# Patient Record
Sex: Female | Born: 1960 | Race: Black or African American | Hispanic: No | Marital: Single
Health system: Southern US, Community
[De-identification: ages and names within clinical notes are randomized; demographics above are authoritative.]

## PROBLEM LIST (undated history)

## (undated) ENCOUNTER — Emergency Department (HOSPITAL_COMMUNITY): Payer: Self-pay

---

## 2020-10-16 ENCOUNTER — Inpatient Hospital Stay
Admission: RE | Admit: 2020-10-16 | Discharge: 2020-12-24 | Disposition: A | Discharge status: Skilled Nursing Facility | Payer: Medicare Other | Source: Other Acute Inpatient Hospital | Attending: Internal Medicine | Admitting: Internal Medicine

## 2020-10-16 ENCOUNTER — Other Ambulatory Visit (HOSPITAL_COMMUNITY): Payer: Medicare Other

## 2020-10-16 DIAGNOSIS — J189 Pneumonia, unspecified organism: Secondary | ICD-10-CM

## 2020-10-16 DIAGNOSIS — J90 Pleural effusion, not elsewhere classified: Secondary | ICD-10-CM

## 2020-10-16 DIAGNOSIS — L0291 Cutaneous abscess, unspecified: Secondary | ICD-10-CM

## 2020-10-16 DIAGNOSIS — J811 Chronic pulmonary edema: Secondary | ICD-10-CM

## 2020-10-16 DIAGNOSIS — D271 Benign neoplasm of left ovary: Secondary | ICD-10-CM

## 2020-10-16 DIAGNOSIS — Z9689 Presence of other specified functional implants: Secondary | ICD-10-CM

## 2020-10-16 DIAGNOSIS — J939 Pneumothorax, unspecified: Secondary | ICD-10-CM

## 2020-10-16 DIAGNOSIS — T85598A Other mechanical complication of other gastrointestinal prosthetic devices, implants and grafts, initial encounter: Secondary | ICD-10-CM

## 2020-10-16 DIAGNOSIS — R509 Fever, unspecified: Secondary | ICD-10-CM

## 2020-10-17 LAB — CBC
HCT: 25.7 % — ABNORMAL LOW (ref 36.0–46.0)
Hemoglobin: 8.3 g/dL — ABNORMAL LOW (ref 12.0–15.0)
MCH: 25.6 pg — ABNORMAL LOW (ref 26.0–34.0)
MCHC: 32.3 g/dL (ref 30.0–36.0)
MCV: 79.3 fL — ABNORMAL LOW (ref 80.0–100.0)
Platelets: 55 10*3/uL — ABNORMAL LOW (ref 150–400)
RBC: 3.24 MIL/uL — ABNORMAL LOW (ref 3.87–5.11)
RDW: 21.9 % — ABNORMAL HIGH (ref 11.5–15.5)
WBC: 13.8 10*3/uL — ABNORMAL HIGH (ref 4.0–10.5)
nRBC: 9.8 % — ABNORMAL HIGH (ref 0.0–0.2)

## 2020-10-17 LAB — BASIC METABOLIC PANEL
Anion gap: 13 (ref 5–15)
BUN: 40 mg/dL — ABNORMAL HIGH (ref 6–20)
CO2: 23 mmol/L (ref 22–32)
Calcium: 8.2 mg/dL — ABNORMAL LOW (ref 8.9–10.3)
Chloride: 95 mmol/L — ABNORMAL LOW (ref 98–111)
Creatinine, Ser: 4.09 mg/dL — ABNORMAL HIGH (ref 0.44–1.00)
GFR, Estimated: 12 mL/min — ABNORMAL LOW (ref >60)
Glucose, Bld: 167 mg/dL — ABNORMAL HIGH (ref 70–99)
Potassium: 4.9 mmol/L (ref 3.5–5.1)
Sodium: 131 mmol/L — ABNORMAL LOW (ref 135–145)

## 2020-10-17 LAB — HEPATITIS B CORE ANTIBODY, TOTAL: Hep B Core Total Ab: NONREACTIVE

## 2020-10-17 NOTE — Consult Note (Signed)
[facility] CONSULT NOTE    Date: 10/17/2020                  Patient Name:  Theresa Russell  MRN: [id number]  DOB: 11-20-1960  Age / Sex: 60 y.o., female         PCP: Pcp, No                 Service Requesting Consult: Hospitalist                 Reason for Consult: Evaluation and management of ESRD.            History of Present Illness: Patient is a 60 y.o. 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  Patient was at [facility] and presented there with altered mental status and generalized weakness.  She was found to be tachycardic with atrial flutter upon initial evaluation.  She had necrotic wounds to bilateral lower extremities and CT scan was done and patient found to have left ischio rectal fossa necrotizing fasciitis.  Patient received broad-spectrum antibiotics.  Patient was found to have multiple organisms within her area of fasciitis.  Nephrology was consulted for ongoing hemodialysis treatments.  Patient has a right internal jugular PermCath in place.  She is seen and evaluated during hemodialysis treatment today and tolerating the treatment relatively well.   Medications:  Discharge medications Metoprolol 100 mg twice daily, Haldol 1 mg every 6 hours as needed, hydralazine 10 mg every 4 hours as needed, Ativan 1 mg 3 times daily as needed, nystatin 1 application twice daily, alprazolam 0.5 mg 3 times daily as needed, linezolid 300 mg every 12 hours, amiodarone 200 mg daily, cefepime 1 g every 24 hours, sliding scale insulin  Allergies: No known drug allergies   Past Medical History:   ESRD with a right IJ PermCath in place, left ischio rectal fossa necrotizing fasciitis status post surgical debridement, sepsis, metabolic encephalopathy, acute respiratory failure, anemia of chronic kidney disease, atrial flutter with RVR, diabetes mellitus type 2, dysphagia, chronic systolic heart failure  Past Surgical History: Right IJ PermCath Recent history of wound debridement  Family History: Unable to obtain from the patient given confusion  Social History: Unable to obtain from the patient given confusion  Review of Systems: Unable to obtain from the patient given confusion  Vital Signs: Temperature 97.7 pulse 122 respirations 19 blood pressure 100/71  Weight trends: There were no vitals filed for this visit.  Physical Exam: Physical Exam: General:  Chronically ill-appearing  Head:  Normocephalic, atraumatic. Moist oral mucosal membranes  Eyes:  Anicteric  Neck:  Supple  Lungs:   Clear to auscultation, normal effort  Heart:  S1S2 no rubs  Abdomen:   Soft, nontender, bowel sounds present  Extremities:  No peripheral edema.  Neurologic:  Awake, but confused  Skin:  No acute rashes  Access:  IJ PermCath in place    Lab results: Basic Metabolic Panel: Recent Labs  Lab 10/17/20 0456  NA 131*  K 4.9  CL 95*  CO2 23  GLUCOSE 167*  BUN 40*  CREATININE 4.09*  CALCIUM 8.2*    Liver Function Tests: No results for input(s):  AST, ALT, ALKPHOS, BILITOT, PROT, ALBUMIN in the last 168 hours. No results for input(s): LIPASE, AMYLASE in the last 168 hours. No results for input(s): AMMONIA in the last 168 hours.  CBC: Recent Labs  Lab 10/17/20 0456  WBC 13.8*  HGB 8.3*  HCT 25.7*  MCV 79.3*  PLT 55*    Cardiac Enzymes: No results for input(s): CKTOTAL, CKMB, CKMBINDEX, TROPONINI in the last 168 hours.  BNP: Invalid input(s): POCBNP  CBG: No results for input(s): GLUCAP in the last 168 hours.  Microbiology: No results found for this or any previous  visit.  Coagulation Studies: No results for input(s): LABPROT, INR in the last 72 hours.  Urinalysis: No results for input(s): COLORURINE, LABSPEC, PHURINE, GLUCOSEU, HGBUR, BILIRUBINUR, KETONESUR, PROTEINUR, UROBILINOGEN, NITRITE, LEUKOCYTESUR in the last 72 hours.  Invalid input(s): APPERANCEUR    Imaging: DG Abd Portable 1V  Result Date: 10/16/2020 CLINICAL DATA:  60 year old female status post NG placement. EXAM: PORTABLE ABDOMEN - 1 VIEW COMPARISON:  Earlier radiograph dated 10/16/2020. FINDINGS: Enteric tube with tip in the distal stomach. Persistent partially visualized air distended loops of small bowel in the mid abdomen. A central venous catheter is partially visualized with tip over right atrium. Degenerative changes of the spine. IMPRESSION: Enteric tube with tip in the distal stomach. Electronically Signed   By: Anner Crete M.D.   On: 10/16/2020 23:35   DG Abd Portable 1V  Result Date: 10/16/2020 CLINICAL DATA:  60 year old female with NG tube. EXAM: PORTABLE ABDOMEN - 1 VIEW COMPARISON:  None. FINDINGS: Partially visualized enteric tube with side-port in the proximal stomach and tip likely in the region of the proximal gastric body. Dilated air-filled loops of small bowel measuring up to 4.6 cm in caliber consistent with obstruction. No free air. Degenerative changes of the spine. No acute osseous pathology. IMPRESSION: 1. Partially visualized enteric tube with tip likely in the proximal gastric body. 2. Small bowel obstruction. Electronically Signed   By: Anner Crete M.D.   On: 10/16/2020 22:08      Assessment & Plan: Pt is a 60 y.o. female with a PMHx of ESRD with a right IJ PermCath in place, left ischio rectal fossa necrotizing fasciitis status post surgical debridement, sepsis, metabolic encephalopathy, acute respiratory failure, anemia of chronic kidney disease, atrial flutter with RVR, diabetes mellitus type 2, dysphagia, chronic systolic heart failure, who was  admitted to [facility] on 10/16/2020 for ongoing care.   1.  ESRD on HD.  Patient has IJ PermCath in place.  We will continue to use IJ PermCath for hemodialysis.  Patient seen and evaluated during dialysis treatment today.  Tolerating well.  Continue dialysis on MWF schedule.  2.  Anemia of chronic kidney disease.  Hemoglobin 8.3.  Start the patient on Retacrit 10,000 units IV with dialysis.  3.  Thrombocytopenia.  Platelets currently 55,000.  HIT panel ordered.  We will plan to pack the catheter with citrate.  4.  Secondary hyperparathyroidism.  Monitor calcium and phosphorus over the course of the hospitalization.  5.  Thanks for consultation.

## 2020-10-18 ENCOUNTER — Other Ambulatory Visit (HOSPITAL_COMMUNITY): Payer: Medicare Other

## 2020-10-18 LAB — HEPARIN INDUCED PLATELET AB (HIT ANTIBODY): Heparin Induced Plt Ab: 0.263 OD (ref 0.000–0.400)

## 2020-10-18 NOTE — Consult Note (Signed)
Referring Physician: Sherie Don, MD  Theresa Russell is an 60 y.o. female.                       Chief Complaint: Atrial fibrillation with RVR  HPI: 60 years old black female with PMH of Left ischiorectal fossa necrotizing fascitis, S/P surgical debridement, Sepsis, metabolic encephalopathy, acute respiratory failure, Anemia of chronic kidney disease, Type 2 DM, Chronic systolic left heart failure and 09/13/2020 diagnosed COVID 19 infection has atrial flutter/fibrillation with RVR. Last echocardiogram was 04/29/2019 showing mild LV and RV systolic dysfunction.   Past medical history: As per HPI.   The histories are not reviewed yet. Please review them in the "History" navigator section and refresh this [place].  No family history on file. Social History:  has no history on file for tobacco use, alcohol use, and drug use.  Allergies: HCTZ-nausea                 Lisinopril: Nausea                 Vancomycin: Swelling  No medications prior to admission.  See List.  Results for orders placed or performed during the hospital encounter of 10/16/20 (from the past 48 hour(s))  Basic metabolic panel     Status: Abnormal   Collection Time: 10/17/20  4:56 AM  Result Value Ref Range   Sodium 131 (L) 135 - 145 mmol/L   Potassium 4.9 3.5 - 5.1 mmol/L   Chloride 95 (L) 98 - 111 mmol/L   CO2 23 22 - 32 mmol/L   Glucose, Bld 167 (H) 70 - 99 mg/dL    Comment: Glucose reference range applies only to samples taken after fasting for at least 8 hours.   BUN 40 (H) 6 - 20 mg/dL   Creatinine, Ser 4.09 (H) 0.44 - 1.00 mg/dL   Calcium 8.2 (L) 8.9 - 10.3 mg/dL   GFR, Estimated 12 (L) >60 mL/min    Comment: (NOTE) Calculated using the CKD-EPI Creatinine Equation (2021)    Anion gap 13 5 - 15    Comment: Performed at [facility] [address], [place], [place] [address]  CBC     Status: Abnormal   Collection Time: 10/17/20  4:56 AM  Result Value Ref Range   WBC 13.8 (H) 4.0 - 10.5  K/uL   RBC 3.24 (L) 3.87 - 5.11 MIL/uL   Hemoglobin 8.3 (L) 12.0 - 15.0 g/dL   HCT 25.7 (L) 36.0 - 46.0 %   MCV 79.3 (L) 80.0 - 100.0 fL   MCH 25.6 (L) 26.0 - 34.0 pg   MCHC 32.3 30.0 - 36.0 g/dL   RDW 21.9 (H) 11.5 - 15.5 %   Platelets 55 (L) 150 - 400 K/uL    Comment: Immature Platelet Fraction may be clinically indicated, consider ordering this additional test [id number] REPEATED TO VERIFY PLATELET COUNT CONFIRMED BY SMEAR    nRBC 9.8 (H) 0.0 - 0.2 %    Comment: Performed at [facility], [place] [address], [place], [place] [address]  Heparin induced platelet Ab (HIT antibody)     Status: None   Collection Time: 10/17/20  2:36 PM  Result Value Ref Range   Heparin Induced Plt Ab 0.263 0.000 - 0.400 OD    Comment: (NOTE) Performed At: [facility] [address] [place], [place] [id number] Rush Farmer MD RC:[id number]   Hepatitis B core antibody, total     Status:  None   Collection Time: 10/17/20  3:02 PM  Result Value Ref Range   Hep B Core Total Ab NON REACTIVE NON REACTIVE    Comment: Performed at [facility] [address] [place], [place] [address]   DG Chest Port 1 View  Result Date: 10/18/2020 CLINICAL DATA:  Pleural effusion.  Pulmonary edema. EXAM: PORTABLE CHEST 1 VIEW COMPARISON:  Abdomen 10/16/2020. FINDINGS: Right dual-lumen catheter noted with tip over right atrium. Left IJ line noted with tip over the upper mid chest, possibly left brachiocephalic vein, clinical correlation suggested. NG tube noted with tip below left hemidiaphragm cardiomegaly. Low lung volumes. Very mild interstitial prominence cannot be excluded. Mild interstitial edema and/or pneumonitis cannot be excluded. No pleural effusion or pneumothorax. Degenerative changes thoracic spine. IMPRESSION: 1. Right dual-lumen catheter noted with tip over right atrium. Left IJ line noted with tip over the upper mid chest, possibly left brachiocephalic vein, clinical correlation suggested.  NG tube noted with tip below left hemidiaphragm. 2. Cardiomegaly. Low lung volumes. Very mild bilateral interstitial prominence cannot be excluded. Mild interstitial edema and/or pneumonitis cannot be excluded. Electronically Signed   By: Marcello Moores  Register   On: 10/18/2020 05:56   DG Abd Portable 1V  Result Date: 10/16/2020 CLINICAL DATA:  60 year old female status post NG placement. EXAM: PORTABLE ABDOMEN - 1 VIEW COMPARISON:  Earlier radiograph dated 10/16/2020. FINDINGS: Enteric tube with tip in the distal stomach. Persistent partially visualized air distended loops of small bowel in the mid abdomen. A central venous catheter is partially visualized with tip over right atrium. Degenerative changes of the spine. IMPRESSION: Enteric tube with tip in the distal stomach. Electronically Signed   By: Anner Crete M.D.   On: 10/16/2020 23:35   DG Abd Portable 1V  Result Date: 10/16/2020 CLINICAL DATA:  60 year old female with NG tube. EXAM: PORTABLE ABDOMEN - 1 VIEW COMPARISON:  None. FINDINGS: Partially visualized enteric tube with side-port in the proximal stomach and tip likely in the region of the proximal gastric body. Dilated air-filled loops of small bowel measuring up to 4.6 cm in caliber consistent with obstruction. No free air. Degenerative changes of the spine. No acute osseous pathology. IMPRESSION: 1. Partially visualized enteric tube with tip likely in the proximal gastric body. 2. Small bowel obstruction. Electronically Signed   By: Anner Crete M.D.   On: 10/16/2020 22:08    Review Of Systems As per HPI   P: 120, R: 24, BP: 124/65, O2 sat 95 %. There were no vitals taken for this visit. There is no height or weight on file to calculate BMI. General appearance: alert, cooperative, appears stated age and no distress Head: Normocephalic, atraumatic. Eyes: Brown eyes, pale conjunctiva, corneas clear.  Neck: No adenopathy, no carotid bruit, no JVD, supple, symmetrical. Resp:  Clearing to auscultation bilaterally. Cardio: Irregular and rapid rate and rhythm, S1, S2 normal, II/VI systolic murmur, no click, rub or gallop GI: Soft, non-tender; bowel sounds normal; no organomegaly. Extremities: 1 + left lower leg edema, right leg with dressing. nocyanosis or clubbing.  Skin: Warm and dry.  Neurologic: Alert and oriented X 3, decreased strength.  Assessment/Plan Atrial flutter/fib with RVR, [id number] score of 4 Acute on chronic respiratory failure with hypoxia Chronic systolic left heart failure Sepsis Rectal area and right lower leg wound Type 2 DM Metabolic ence[halopathy ESRD  Continue metoprolol. Add digoxin for CHF. Increase amiodarone dose for rate control. Echocardiogram.  Time spent: Review of old records, Lab, x-rays, EKG, other  cardiac tests, examination, discussion with patient over 70 minutes.  Birdie Riddle, MD  10/18/2020, 5:18 PM

## 2020-10-19 ENCOUNTER — Other Ambulatory Visit (HOSPITAL_COMMUNITY): Payer: Medicare Other

## 2020-10-19 LAB — CBC
HCT: 24.9 % — ABNORMAL LOW (ref 36.0–46.0)
Hemoglobin: 7.6 g/dL — ABNORMAL LOW (ref 12.0–15.0)
MCH: 24.9 pg — ABNORMAL LOW (ref 26.0–34.0)
MCHC: 30.5 g/dL (ref 30.0–36.0)
MCV: 81.6 fL (ref 80.0–100.0)
Platelets: 39 10*3/uL — ABNORMAL LOW (ref 150–400)
RBC: 3.05 MIL/uL — ABNORMAL LOW (ref 3.87–5.11)
RDW: 22.5 % — ABNORMAL HIGH (ref 11.5–15.5)
WBC: 11.1 10*3/uL — ABNORMAL HIGH (ref 4.0–10.5)
nRBC: 11.1 % — ABNORMAL HIGH (ref 0.0–0.2)

## 2020-10-19 LAB — RENAL FUNCTION PANEL
Albumin: 1.7 g/dL — ABNORMAL LOW (ref 3.5–5.0)
Anion gap: 18 — ABNORMAL HIGH (ref 5–15)
BUN: 47 mg/dL — ABNORMAL HIGH (ref 6–20)
CO2: 19 mmol/L — ABNORMAL LOW (ref 22–32)
Calcium: 8.9 mg/dL (ref 8.9–10.3)
Chloride: 92 mmol/L — ABNORMAL LOW (ref 98–111)
Creatinine, Ser: 4.05 mg/dL — ABNORMAL HIGH (ref 0.44–1.00)
GFR, Estimated: 12 mL/min — ABNORMAL LOW (ref >60)
Glucose, Bld: 174 mg/dL — ABNORMAL HIGH (ref 70–99)
Phosphorus: 4.5 mg/dL (ref 2.5–4.6)
Potassium: 4.8 mmol/L (ref 3.5–5.1)
Sodium: 129 mmol/L — ABNORMAL LOW (ref 135–145)

## 2020-10-19 LAB — HEMOGLOBIN A1C
Hgb A1c MFr Bld: 6.8 % — ABNORMAL HIGH (ref 4.8–5.6)
Mean Plasma Glucose: 148.46 mg/dL

## 2020-10-19 LAB — ECHOCARDIOGRAM COMPLETE
S' Lateral: 3.2 cm
Single Plane A4C EF: 47.3 %

## 2020-10-19 NOTE — Progress Notes (Signed)
  Echocardiogram 2D Echocardiogram has been performed.  Theresa Russell 10/19/2020, 2:12 PM

## 2020-10-19 NOTE — Progress Notes (Signed)
[facility]  ROUNDING NOTE   Subjective:  Patient seen and evaluated at bedside. Amiodarone dose has been increased. Due for dialysis again today.   Objective:  Vital signs in last 24 hours:  Temperature 96.2 pulse 116 respirations 23 blood pressure 116/50   Physical Exam: General:  No acute distress  Head:  Normocephalic, atraumatic. Moist oral mucosal membranes  Eyes:  Anicteric  Neck:  Supple  Lungs:   Clear to auscultation, normal effort  Heart:  S1S2 tachycardic  Abdomen:   Soft, nontender, bowel sounds present  Extremities:  Right lower extremity with dressing, no edema in left lower extremity  Neurologic:  Awake, alert, following commands  Skin:  No acute rash  Access:  IJ PermCath in place    Basic Metabolic Panel: Recent Labs  Lab 10/17/20 0456 10/19/20 0358  NA 131* 129*  K 4.9 4.8  CL 95* 92*  CO2 23 19*  GLUCOSE 167* 174*  BUN 40* 47*  CREATININE 4.09* 4.05*  CALCIUM 8.2* 8.9  PHOS  --  4.5    Liver Function Tests: Recent Labs  Lab 10/19/20 0358  ALBUMIN 1.7*   No results for input(s): LIPASE, AMYLASE in the last 168 hours. No results for input(s): AMMONIA in the last 168 hours.  CBC: Recent Labs  Lab 10/17/20 0456 10/19/20 0358  WBC 13.8* 11.1*  HGB 8.3* 7.6*  HCT 25.7* 24.9*  MCV 79.3* 81.6  PLT 55* 39*    Cardiac Enzymes: No results for input(s): CKTOTAL, CKMB, CKMBINDEX, TROPONINI in the last 168 hours.  BNP: Invalid input(s): POCBNP  CBG: No results for input(s): GLUCAP in the last 168 hours.  Microbiology: No results found for this or any previous visit.  Coagulation Studies: No results for input(s): LABPROT, INR in the last 72 hours.  Urinalysis: No results for input(s): COLORURINE, LABSPEC, PHURINE, GLUCOSEU, HGBUR, BILIRUBINUR, KETONESUR, PROTEINUR, UROBILINOGEN, NITRITE, LEUKOCYTESUR in the last 72 hours.  Invalid input(s): APPERANCEUR    Imaging: DG Chest Port 1 View  Result Date:  10/18/2020 CLINICAL DATA:  Pleural effusion.  Pulmonary edema. EXAM: PORTABLE CHEST 1 VIEW COMPARISON:  Abdomen 10/16/2020. FINDINGS: Right dual-lumen catheter noted with tip over right atrium. Left IJ line noted with tip over the upper mid chest, possibly left brachiocephalic vein, clinical correlation suggested. NG tube noted with tip below left hemidiaphragm cardiomegaly. Low lung volumes. Very mild interstitial prominence cannot be excluded. Mild interstitial edema and/or pneumonitis cannot be excluded. No pleural effusion or pneumothorax. Degenerative changes thoracic spine. IMPRESSION: 1. Right dual-lumen catheter noted with tip over right atrium. Left IJ line noted with tip over the upper mid chest, possibly left brachiocephalic vein, clinical correlation suggested. NG tube noted with tip below left hemidiaphragm. 2. Cardiomegaly. Low lung volumes. Very mild bilateral interstitial prominence cannot be excluded. Mild interstitial edema and/or pneumonitis cannot be excluded. Electronically Signed   By: Marcello Moores  Register   On: 10/18/2020 05:56     Medications:       Assessment/ Plan:  60 y.o. 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   1.  ESRD on HD.  We will plan for hemodialysis treatment today.  Orders have been prepared.  Monitor heart rate closely  during dialysis as it remains a bit high.  2.  Anemia of chronic kidney disease.  Hemoglobin down to 7.6.  Continue Retacrit 10,000 units IV with dialysis.  3.  Secondary hyperparathyroidism.  Phosphorus currently 4.5 and acceptable.  Continue to monitor bone mineral metabolism parameters.  4.   Thrombocytopenia.  Platelets lower at 39,000.  Continue pack catheter with citrate. Heparin antibodies within normal range.  5.  Hyponatremia.  Serum sodium 129.  Should correct with ongoing dialysis treatments.   LOS: 0 Trenese Haft 2/25/20228:04 AM

## 2020-10-19 NOTE — Consult Note (Signed)
Ref: Pcp, No   Subjective:  Awake. Atrial flutter with RVR continues post hemodialysis today. Echocardiogram with moderate LVH and mild to moderate LV and RV systolic dysfunction. Severe TR.  Objective:  Vital Signs in the last 24 hours:  P: 124, R: 30, BP: 126/83 Oxygen 3L/[place], O2 sat 95 %.  Physical Exam: BP Readings from Last 1 Encounters:  No data found for BP     Wt Readings from Last 1 Encounters:  No data found for Wt    Weight change:  There is no height or weight on file to calculate BMI. HEENT: [place]/AT, Eyes-Brown, Conjunctiva-Pale, Sclera-Non-icteric Neck: + JVD at 0 degree angle, No bruit, Trachea midline. Lungs:  Clearing, Bilateral. Cardiac:  Regular rhythm, normal S1 and S2, no S3. II/VI systolic murmur. Abdomen:  Soft, non-tender. BS present. Distended Extremities:  Trace edema present. No cyanosis. No clubbing. CNS: AxOx1, Cranial nerves grossly intact, moves all 4 extremities.  Skin: Warm and dry.   Intake/Output from previous day: No intake/output data recorded.    Lab Results: BMET    Component Value Date/Time   NA 129 (L) 10/19/2020 0358   NA 131 (L) 10/17/2020 0456   K 4.8 10/19/2020 0358   K 4.9 10/17/2020 0456   CL 92 (L) 10/19/2020 0358   CL 95 (L) 10/17/2020 0456   CO2 19 (L) 10/19/2020 0358   CO2 23 10/17/2020 0456   GLUCOSE 174 (H) 10/19/2020 0358   GLUCOSE 167 (H) 10/17/2020 0456   BUN 47 (H) 10/19/2020 0358   BUN 40 (H) 10/17/2020 0456   CREATININE 4.05 (H) 10/19/2020 0358   CREATININE 4.09 (H) 10/17/2020 0456   CALCIUM 8.9 10/19/2020 0358   CALCIUM 8.2 (L) 10/17/2020 0456   GFRNONAA 12 (L) 10/19/2020 0358   GFRNONAA 12 (L) 10/17/2020 0456   CBC    Component Value Date/Time   WBC 11.1 (H) 10/19/2020 0358   RBC 3.05 (L) 10/19/2020 0358   HGB 7.6 (L) 10/19/2020 0358   HCT 24.9 (L) 10/19/2020 0358   PLT 39 (L) 10/19/2020 0358   MCV 81.6 10/19/2020 0358   MCH 24.9 (L) 10/19/2020 0358   MCHC 30.5 10/19/2020 0358   RDW 22.5 (H)  10/19/2020 0358   HEPATIC Function Panel No results for input(s): PROT in the last 8760 hours.  Invalid input(s):  ALBUMIN,  AST,  ALT,  ALKPHOS,  BILIDIR,  IBILI HEMOGLOBIN A1C No components found for: HGA1C,  MPG CARDIAC ENZYMES No results found for: CKTOTAL, CKMB, CKMBINDEX, TROPONINI BNP No results for input(s): PROBNP in the last 8760 hours. TSH No results for input(s): TSH in the last 8760 hours. CHOLESTEROL No results for input(s): CHOL in the last 8760 hours.  Scheduled Meds: Continuous Infusions: PRN Meds:.  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 Add digoxin for RV systolic dysfunction, reduced dose due to renal dysfunction. Add Diltiazem for rate control and pulmonary hypertension   LOS: 0 days   Time spent including chart review, lab review, examination, discussion with patient/Nurse : 30 min   Dixie Dials  MD  10/19/2020, 8:04 PM

## 2020-10-20 LAB — BASIC METABOLIC PANEL
Anion gap: 14 (ref 5–15)
Anion gap: 15 (ref 5–15)
BUN: 38 mg/dL — ABNORMAL HIGH (ref 6–20)
BUN: 41 mg/dL — ABNORMAL HIGH (ref 6–20)
CO2: 19 mmol/L — ABNORMAL LOW (ref 22–32)
CO2: 20 mmol/L — ABNORMAL LOW (ref 22–32)
Calcium: 8.8 mg/dL — ABNORMAL LOW (ref 8.9–10.3)
Calcium: 8.8 mg/dL — ABNORMAL LOW (ref 8.9–10.3)
Chloride: 96 mmol/L — ABNORMAL LOW (ref 98–111)
Chloride: 94 mmol/L — ABNORMAL LOW (ref 98–111)
Creatinine, Ser: 3.24 mg/dL — ABNORMAL HIGH (ref 0.44–1.00)
Creatinine, Ser: 3.47 mg/dL — ABNORMAL HIGH (ref 0.44–1.00)
GFR, Estimated: 16 mL/min — ABNORMAL LOW (ref >60)
GFR, Estimated: 15 mL/min — ABNORMAL LOW (ref >60)
Glucose, Bld: 158 mg/dL — ABNORMAL HIGH (ref 70–99)
Glucose, Bld: 189 mg/dL — ABNORMAL HIGH (ref 70–99)
Potassium: 5.5 mmol/L — ABNORMAL HIGH (ref 3.5–5.1)
Potassium: 4.1 mmol/L (ref 3.5–5.1)
Sodium: 129 mmol/L — ABNORMAL LOW (ref 135–145)
Sodium: 129 mmol/L — ABNORMAL LOW (ref 135–145)

## 2020-10-20 LAB — MAGNESIUM: Magnesium: 1.9 mg/dL (ref 1.7–2.4)

## 2020-10-20 LAB — CBC
HCT: 23.3 % — ABNORMAL LOW (ref 36.0–46.0)
Hemoglobin: 7.2 g/dL — ABNORMAL LOW (ref 12.0–15.0)
MCH: 24.9 pg — ABNORMAL LOW (ref 26.0–34.0)
MCHC: 30.9 g/dL (ref 30.0–36.0)
MCV: 80.6 fL (ref 80.0–100.0)
Platelets: 29 10*3/uL — CL (ref 150–400)
RBC: 2.89 MIL/uL — ABNORMAL LOW (ref 3.87–5.11)
RDW: 22.3 % — ABNORMAL HIGH (ref 11.5–15.5)
WBC: 10 10*3/uL (ref 4.0–10.5)
nRBC: 10.1 % — ABNORMAL HIGH (ref 0.0–0.2)

## 2020-10-20 NOTE — Consult Note (Signed)
Infectious Disease Consultation   Theresa Russell  EXH:[id number]  DOB: [birth year]  DOA: 10/16/2020  Requesting physician: Dr.Hijazi  Reason for consultation: Antibiotic recommendations   History of Present Illness: Theresa Russell is an 60 y.o. female with multiple medical problems including ESRD on dialysis, chronic hypoxemic respiratory failure secondary to COPD, hypertension, hyperlipidemia, coronary disease, diabetes mellitus who apparently was admitted to [facility] when she was brought in by EMS with altered mental status, generalized weakness.  In the emergency room she was found to be tachycardic with heart rate in the 140s and atrial flutter.  Blood pressure was stable.  She was noted to have cellulitis, necrotic wounds to bilateral lower extremities.  [place] CT was done and it showed findings concerning for left ischial rectal fossa necrotizing fasciitis.  General surgery was consulted.  They initially recommended transfer to tertiary care center.  She was started on broad-spectrum antimicrobials.  However, transfer was on unsuccessful.  Patient was taken to surgery and underwent incision and drainage of the left perineal and ischio rectal abscess measuring about 5 x 5 x 10 cm.  She subsequently developed septic shock requiring vasopressors.  She was given aggressive hydration and broad-spectrum antimicrobial coverage.  Her wound cultures grew Morganella, E. coli, MRSA.  She was initially treated with IV Zosyn, clindamycin. Nephrology was consulted and patient was receiving hemodialysis.  She also had leukocytosis which improved initially but then again there was an upward trend on 10/11/2020.  She underwent repeat imaging of her pelvis which showed concern for necrotizing fasciitis involving the left thigh extending into the groin.  General surgery was consulted again and patient underwent bedside debridement.  After the procedure her WBC count improved.   Patient failed swallow study therefore placed on tube feeding.  Due to her complex medical problems she was transferred and admitted to [facility] on 10/16/2020.  She is currently on treatment with IV cefepime, Zyvox.  However, she is having worsening thrombocytopenia.  Platelet count today 29.  She is on oxygen by nasal cannula.  She is complaining of back pain.  Otherwise very poor historian.  Review of Systems:  Review of systems negative except as mentioned above in the HPI  Past Medical History: ESRD, hypertension, hyperlipidemia, atrial fibrillation, COPD  Past Surgical History: Recent incision and debridement, AV fistula  Allergies: Atorvastatin, Levaquin, lisinopril, vancomycin  Social History:  has no history on file for tobacco use, alcohol use, and drug use.   Family History: Her mother and father deceased, unable to obtain other family medical history at this time.  Physical Exam: Vitals: Temperature 98.9, pulse 119, respiratory rate 18, blood pressure 113/83, pulse oximetry 95% on oxygen nasal cannula Constitutional: Chronically ill-appearing female, oriented x2 Head: Atraumatic, normocephalic Eyes: PERLA, EOMI  ENMT: external ears and nose appear normal, normal hearing, Lips appears normal, moist oral mucosa Neck: neck appears normal, no masses CVS: S1-S2  Respiratory: Decreased breath sounds lower lobes, occasional rhonchi, no wheezing Abdomen: soft nontender,normal bowel sounds, lower abdominal wall edema Musculoskeletal: Bilateral lower extremity wounds with dressing in place, mild edema of the upper extremities Neuro: Severe debility with generalized weakness Psych: stable mood Skin: Bilateral lower extremity wounds, left ischial rectal area wound status post surgical debridement Data reviewed:  I have personally reviewed following labs and imaging studies Labs:  CBC: Recent Labs  Lab 10/17/20 0456 10/19/20 0358 10/20/20 0414  WBC 13.8* 11.1*  10.0  HGB 8.3* 7.6* 7.2*  HCT 25.7* 24.9* 23.3*  MCV 79.3* 81.6 80.6  PLT 55* 39* 29*    Basic Metabolic Panel: Recent Labs  Lab 10/17/20 0456 10/19/20 0358 10/20/20 0414 10/20/20 0934  NA 131* 129* 129* 129*  K 4.9 4.8 5.5* 4.1  CL 95* 92* 96* 94*  CO2 23 19* 19* 20*  GLUCOSE 167* 174* 158* 189*  BUN 40* 47* 38* 41*  CREATININE 4.09* 4.05* 3.24* 3.47*  CALCIUM 8.2* 8.9 8.8* 8.8*  MG  --   --  1.9  --   PHOS  --  4.5  --   --    GFR CrCl cannot be calculated (Unknown ideal weight.). Liver Function Tests: Recent Labs  Lab 10/19/20 0358  ALBUMIN 1.7*   No results for input(s): LIPASE, AMYLASE in the last 168 hours. No results for input(s): AMMONIA in the last 168 hours. Coagulation profile No results for input(s): INR, PROTIME in the last 168 hours.  Cardiac Enzymes: No results for input(s): CKTOTAL, CKMB, CKMBINDEX, TROPONINI in the last 168 hours. BNP: Invalid input(s): POCBNP CBG: No results for input(s): GLUCAP in the last 168 hours. D-Dimer No results for input(s): DDIMER in the last 72 hours. Hgb A1c Recent Labs    10/19/20 0358  HGBA1C 6.8*   Lipid Profile No results for input(s): CHOL, HDL, LDLCALC, TRIG, CHOLHDL, LDLDIRECT in the last 72 hours. Thyroid function studies No results for input(s): TSH, T4TOTAL, T3FREE, THYROIDAB in the last 72 hours.  Invalid input(s): FREET3 Anemia work up No results for input(s): VITAMINB12, FOLATE, FERRITIN, TIBC, IRON, RETICCTPCT in the last 72 hours. Urinalysis No results found for: COLORURINE, APPEARANCEUR, LABSPEC, [place], GLUCOSEU, HGBUR, BILIRUBINUR, KETONESUR, PROTEINUR, UROBILINOGEN, NITRITE, LEUKOCYTESUR   Sepsis Labs Invalid input(s): PROCALCITONIN,  WBC,  LACTICIDVEN Microbiology No results found for this or any previous visit (from the past 240 hour(s)).   Inpatient Medications:   Please see MAR  Radiological Exams on Admission: ECHOCARDIOGRAM COMPLETE  Result Date: 10/19/2020     ECHOCARDIOGRAM REPORT   Patient Name:   Theresa Russell Date of Exam: 10/19/2020 Medical Rec #:  [id number]           Height: Accession #:    [id number]          Weight: Date of Birth:  1961/07/26          BSA: Patient Age:    57 years            BP:           91/41 mmHg Patient Gender: F                   HR:           118 bpm. Exam Location:  Inpatient Procedure: 2D Echo Indications:     atrial flutter  History:         Patient has no prior history of Echocardiogram examinations.                  Arrythmias:Atrial Flutter.  Sonographer:     Johny Chess RDCS Referring Phys:  [place] Diagnosing Phys: Dixie Dials MD  Sonographer Comments: Patient is morbidly obese. Image acquisition challenging due to patient body habitus. IMPRESSIONS  1. Left ventricular ejection fraction, by estimation, is 40 to 45%. The left ventricle has mild to moderately decreased function. The left ventricle demonstrates regional wall motion abnormalities (see scoring diagram/findings for description). There is  moderate concentric left ventricular  hypertrophy. Left ventricular diastolic parameters are indeterminate. There is mild hypokinesis of the left ventricular, entire anteroseptal wall.  2. Right ventricular systolic function is moderately reduced. The right ventricular size is moderately enlarged. Mildly increased right ventricular wall thickness. There is severely elevated pulmonary artery systolic pressure.  3. Left atrial size was moderately dilated.  4. Right atrial size was moderately dilated.  5. The mitral valve is normal in structure. Trivial mitral valve regurgitation.  6. Tricuspid valve regurgitation is severe.  7. The aortic valve is tricuspid. Aortic valve regurgitation is trivial. Mild aortic valve sclerosis is present, with no evidence of aortic valve stenosis.  8. The inferior vena cava is dilated in size with <50% respiratory variability, suggesting right atrial pressure of 15 mmHg. FINDINGS  Left  Ventricle: Left ventricular ejection fraction, by estimation, is 40 to 45%. The left ventricle has mild to moderately decreased function. The left ventricle demonstrates regional wall motion abnormalities. Mild hypokinesis of the left ventricular, entire anteroseptal wall. The left ventricular internal cavity size was normal in size. There is moderate concentric left ventricular hypertrophy. Left ventricular diastolic parameters are indeterminate. Right Ventricle: The right ventricular size is moderately enlarged. Mildly increased right ventricular wall thickness. Right ventricular systolic function is moderately reduced. There is severely elevated pulmonary artery systolic pressure. The tricuspid  regurgitant velocity is 3.95 m/s, and with an assumed right atrial pressure of 10 mmHg, the estimated right ventricular systolic pressure is 61.9 mmHg. Left Atrium: Left atrial size was moderately dilated. Right Atrium: Catheter. Right atrial size was moderately dilated. Pericardium: There is no evidence of pericardial effusion. Mitral Valve: The mitral valve is normal in structure. Trivial mitral valve regurgitation. Tricuspid Valve: The tricuspid valve is normal in structure. Tricuspid valve regurgitation is severe. Aortic Valve: The aortic valve is tricuspid. Aortic valve regurgitation is trivial. Mild aortic valve sclerosis is present, with no evidence of aortic valve stenosis. Pulmonic Valve: The pulmonic valve was normal in structure. Pulmonic valve regurgitation is trivial. Aorta: The aortic root is normal in size and structure. There is minimal (Grade I) atheroma plaque involving the aortic root and ascending aorta. Venous: The inferior vena cava is dilated in size with less than 50% respiratory variability, suggesting right atrial pressure of 15 mmHg. IAS/Shunts: The atrial septum is grossly normal.  LEFT VENTRICLE PLAX 2D LVIDd:         4.00 cm LVIDs:         3.20 cm LV PW:         1.50 cm LV IVS:        1.20 cm  LVOT diam:     2.10 cm LV SV:         31 LVOT Area:     3.46 cm  LV Volumes (MOD) LV vol d, MOD A4C: 73.1 ml LV vol s, MOD A4C: 38.5 ml LV SV MOD A4C:     73.1 ml RIGHT VENTRICLE            IVC RV S prime:     8.38 cm/s  IVC diam: 2.60 cm TAPSE (M-mode): 0.9 cm LEFT ATRIUM           RIGHT ATRIUM LA diam:      4.10 cm RA Area:     20.40 cm LA Vol (A2C): 57.3 ml RA Volume:   59.60 ml LA Vol (A4C): 59.8 ml  AORTIC VALVE LVOT Vmax:   58.70 cm/s LVOT Vmean:  38.700 cm/s LVOT VTI:    0.089  m  AORTA Ao Root diam: 3.70 cm Ao Asc diam:  2.80 cm TRICUSPID VALVE TR Peak grad:   62.4 mmHg TR Vmax:        395.00 cm/s  SHUNTS Systemic VTI:  0.09 m Systemic Diam: 2.10 cm Dixie Dials MD Electronically signed by Dixie Dials MD Signature Date/Time: 10/19/2020/7:40:42 PM    Final     Impression/Recommendations Active Problems: Acute on chronic hypoxemic respiratory failure Left ischio rectal fossa necrotizing fasciitis status post debridement Perirectal abscess status post debridement Severe sepsis with septic shock, currently shock resolved Bilateral lower extremity wounds Thrombocytopenia COPD End-stage renal disease on hemodialysis Diabetes mellitus type 2 Dysphagia/protein calorie malnutrition Encephalopathy Chronic systolic congestive heart failure with EF around 40% Pulmonary hypertension  Acute on chronic hypoxemic respiratory failure: Multifactorial etiology.  Likely secondary to volume overload/pulmonary edema/pleural effusion, COPD with acute exacerbation.  She is currently on oxygen by nasal cannula.  She has been on treatment with Zyvox, cefepime.  However, having worsening thrombocytopenia.  At this time would recommend to discontinue the Zyvox.  Switch to doxycycline and complete the treatment.  She has another 2 days of treatment with IV cefepime.  Unfortunately developing worsening thrombocytopenia.  In the setting of worsening thrombocytopenia with no clear etiology suggest to stop the cefepime.   Monitor platelet counts.  She also has dysphagia and high risk for aspiration and aspiration pneumonia.  Continue to monitor closely.  Left ischio rectal fossa necrotizing fasciitis status post debridement/perirectal abscess: She had debridement done and she also had drainage of the perirectal abscess.  She had cultures that showed Morganella morganii, E. coli, MRSA at the outside facility.  She has been on treatment with IV cefepime, Zyvox.  Unfortunately having worsening thrombocytopenia.  Recommend to DC the Zyvox and switch to doxycycline and complete the treatment course.  She is almost ending her cefepime course.  In the setting of thrombocytopenia which is worsening with no clear etiology suggest to stop the cefepime and continue to monitor platelet counts closely.  Continue local wound care.  If with worsening consider consulting surgery to evaluate.  Severe sepsis with septic shock, currently shock resolved: In the setting of necrotizing fasciitis/perirectal abscess.  She required pressors at the outside facility.  Her blood pressure appears to be low but stable.  Antibiotics as mentioned above.  Continue to monitor closely.  Bilateral lower extremity wounds: Continue local wound care.  She is completing treatment with antibiotics.  Thrombocytopenia: Exact etiology for the thrombocytopenia is unclear.  However, having worsening platelet count.  HIT panel was negative per the primary team.  Unfortunately we may have no other choice but to stop the cefepime and Zyvox.  To suggest to switch to doxycycline.  She is almost nearing the end of her treatment with IV cefepime.  Continue to monitor platelet counts closely.  End-stage renal disease on hemodialysis: Dialysis per nephrology team.  Medications renally dosed.  Diabetes mellitus type 2: Continue to monitor Accu-Cheks, medications and management of diabetes per the primary team.  She will need proper glycemic control in order to enable wound  healing.  COPD: Continue to monitor, management per the primary team.  Dysphagia/protein calorie malnutrition: On PEG tube feeds.  Due to her dysphagia she is very high risk for aspiration and recurrent aspiration pneumonia.  Encephalopathy: Likely toxic/metabolic.  Continue supportive management per the primary team.  Chronic systolic congestive heart failure with EF around 40%: Continue medication and management per the primary team.  She was also  noted to have pulmonary hypertension on the echocardiogram.  Unfortunately due to her complex medical problems she is very high risk for worsening and decompensation.  Plan of care discussed with the patient, also discussed with the primary team.  Thank you for this consultation.    Yaakov Guthrie M.D. 10/20/2020, 1:29 PM

## 2020-10-20 NOTE — Consult Note (Signed)
Ref: Pcp, No   Subjective:  Resting comfortably. Tachycardia resolves with diltiazem use.  Objective:  Vital Signs in the last 24 hours:  P: 80, R: 28, BP: 106/67, O2 sat 97 % on 3 L O2 by nasal cannula.  Physical Exam: BP Readings from Last 1 Encounters:  No data found for BP     Wt Readings from Last 1 Encounters:  No data found for Wt    Weight change:  There is no height or weight on file to calculate BMI. HEENT: [place]/AT, Eyes-Brown, Conjunctiva-Pale, Sclera-Non-icteric Neck: + JVD, No bruit, Trachea midline. Lungs:  Clearing, Bilateral. Cardiac:  Regular rhythm, normal S1 and S2, no S3. II/VI systolic murmur. Abdomen:  Soft, non-tender. BS present. Extremities:  Trace edema present. No cyanosis. No clubbing. CNS: AxOx1, Cranial nerves grossly intact, moves all 4 extremities.  Skin: Warm and dry.   Intake/Output from previous day: No intake/output data recorded.    Lab Results: BMET    Component Value Date/Time   NA 129 (L) 10/20/2020 0934   NA 129 (L) 10/20/2020 0414   NA 129 (L) 10/19/2020 0358   K 4.1 10/20/2020 0934   K 5.5 (H) 10/20/2020 0414   K 4.8 10/19/2020 0358   CL 94 (L) 10/20/2020 0934   CL 96 (L) 10/20/2020 0414   CL 92 (L) 10/19/2020 0358   CO2 20 (L) 10/20/2020 0934   CO2 19 (L) 10/20/2020 0414   CO2 19 (L) 10/19/2020 0358   GLUCOSE 189 (H) 10/20/2020 0934   GLUCOSE 158 (H) 10/20/2020 0414   GLUCOSE 174 (H) 10/19/2020 0358   BUN 41 (H) 10/20/2020 0934   BUN 38 (H) 10/20/2020 0414   BUN 47 (H) 10/19/2020 0358   CREATININE 3.47 (H) 10/20/2020 0934   CREATININE 3.24 (H) 10/20/2020 0414   CREATININE 4.05 (H) 10/19/2020 0358   CALCIUM 8.8 (L) 10/20/2020 0934   CALCIUM 8.8 (L) 10/20/2020 0414   CALCIUM 8.9 10/19/2020 0358   GFRNONAA 15 (L) 10/20/2020 0934   GFRNONAA 16 (L) 10/20/2020 0414   GFRNONAA 12 (L) 10/19/2020 0358   CBC    Component Value Date/Time   WBC 10.0 10/20/2020 0414   RBC 2.89 (L) 10/20/2020 0414   HGB 7.2 (L)  10/20/2020 0414   HCT 23.3 (L) 10/20/2020 0414   PLT 29 (LL) 10/20/2020 0414   MCV 80.6 10/20/2020 0414   MCH 24.9 (L) 10/20/2020 0414   MCHC 30.9 10/20/2020 0414   RDW 22.3 (H) 10/20/2020 0414   HEPATIC Function Panel No results for input(s): PROT in the last 8760 hours.  Invalid input(s):  ALBUMIN,  AST,  ALT,  ALKPHOS,  BILIDIR,  IBILI HEMOGLOBIN A1C No components found for: HGA1C,  MPG CARDIAC ENZYMES No results found for: CKTOTAL, CKMB, CKMBINDEX, TROPONINI BNP No results for input(s): PROBNP in the last 8760 hours. TSH No results for input(s): TSH in the last 8760 hours. CHOLESTEROL No results for input(s): CHOL in the last 8760 hours.  Scheduled Meds: Continuous Infusions: PRN Meds:.  Assessment/Plan: Atrial flutter with controlled ventricular response Acute on chronic respiratory failure with hypoxia Chronic systolic left heart failure Sepsis Rectal area and right lower leg wounds Type 2 DM Metabolic encephalopathy ESRD Anemia of chronic disease Moderate to severe pulmonary hypertension  Plan: Continue medications.   LOS: 0 days   Time spent including chart review, lab review, examination, discussion with patient/Nurse : 30 min   Dixie Dials  MD  10/20/2020, 11:38 AM

## 2020-10-21 LAB — CBC
HCT: 20.6 % — ABNORMAL LOW (ref 36.0–46.0)
Hemoglobin: 6.5 g/dL — CL (ref 12.0–15.0)
MCH: 25.6 pg — ABNORMAL LOW (ref 26.0–34.0)
MCHC: 31.6 g/dL (ref 30.0–36.0)
MCV: 81.1 fL (ref 80.0–100.0)
Platelets: 28 10*3/uL — CL (ref 150–400)
RBC: 2.54 MIL/uL — ABNORMAL LOW (ref 3.87–5.11)
RDW: 21.9 % — ABNORMAL HIGH (ref 11.5–15.5)
WBC: 8.4 10*3/uL (ref 4.0–10.5)
nRBC: 10.9 % — ABNORMAL HIGH (ref 0.0–0.2)

## 2020-10-21 LAB — DIC (DISSEMINATED INTRAVASCULAR COAGULATION)PANEL
D-Dimer, Quant: 16.03 ug/mL-FEU — ABNORMAL HIGH (ref 0.00–0.50)
Fibrinogen: 354 mg/dL (ref 210–475)
INR: 1.3 — ABNORMAL HIGH (ref 0.8–1.2)
Platelets: 26 10*3/uL — CL (ref 150–400)
Prothrombin Time: 15.8 seconds — ABNORMAL HIGH (ref 11.4–15.2)
Smear Review: NONE SEEN
aPTT: 37 seconds — ABNORMAL HIGH (ref 24–36)

## 2020-10-21 LAB — PREPARE RBC (CROSSMATCH)

## 2020-10-21 LAB — OCCULT BLOOD X 1 CARD TO LAB, STOOL: Fecal Occult Bld: NEGATIVE

## 2020-10-21 LAB — BILIRUBIN, DIRECT: Bilirubin, Direct: 2.9 mg/dL — ABNORMAL HIGH (ref 0.0–0.2)

## 2020-10-21 LAB — LACTATE DEHYDROGENASE: LDH: 312 U/L — ABNORMAL HIGH (ref 98–192)

## 2020-10-21 LAB — BILIRUBIN, TOTAL: Total Bilirubin: 5.1 mg/dL — ABNORMAL HIGH (ref 0.3–1.2)

## 2020-10-21 LAB — ABO/RH: ABO/RH(D): B POS

## 2020-10-22 LAB — TYPE AND SCREEN
ABO/RH(D): B POS
Antibody Screen: NEGATIVE
Unit division: 0

## 2020-10-22 LAB — BPAM RBC
Blood Product Expiration Date: 202203052359
ISSUE DATE / TIME: 202202271132
Unit Type and Rh: 7300

## 2020-10-22 LAB — RENAL FUNCTION PANEL
Albumin: 2 g/dL — ABNORMAL LOW (ref 3.5–5.0)
Anion gap: 15 (ref 5–15)
BUN: 73 mg/dL — ABNORMAL HIGH (ref 6–20)
CO2: 20 mmol/L — ABNORMAL LOW (ref 22–32)
Calcium: 9.1 mg/dL (ref 8.9–10.3)
Chloride: 95 mmol/L — ABNORMAL LOW (ref 98–111)
Creatinine, Ser: 4.61 mg/dL — ABNORMAL HIGH (ref 0.44–1.00)
GFR, Estimated: 10 mL/min — ABNORMAL LOW (ref >60)
Glucose, Bld: 225 mg/dL — ABNORMAL HIGH (ref 70–99)
Phosphorus: 5.5 mg/dL — ABNORMAL HIGH (ref 2.5–4.6)
Potassium: 4.6 mmol/L (ref 3.5–5.1)
Sodium: 130 mmol/L — ABNORMAL LOW (ref 135–145)

## 2020-10-22 LAB — HEPATIC FUNCTION PANEL
ALT: 60 U/L — ABNORMAL HIGH (ref 0–44)
AST: 53 U/L — ABNORMAL HIGH (ref 15–41)
Albumin: 1.9 g/dL — ABNORMAL LOW (ref 3.5–5.0)
Alkaline Phosphatase: 203 U/L — ABNORMAL HIGH (ref 38–126)
Bilirubin, Direct: 3 mg/dL — ABNORMAL HIGH (ref 0.0–0.2)
Indirect Bilirubin: 2.3 mg/dL — ABNORMAL HIGH (ref 0.3–0.9)
Total Bilirubin: 5.3 mg/dL — ABNORMAL HIGH (ref 0.3–1.2)
Total Protein: 8.3 g/dL — ABNORMAL HIGH (ref 6.5–8.1)

## 2020-10-22 LAB — CBC
HCT: 24.6 % — ABNORMAL LOW (ref 36.0–46.0)
Hemoglobin: 8 g/dL — ABNORMAL LOW (ref 12.0–15.0)
MCH: 26.8 pg (ref 26.0–34.0)
MCHC: 32.5 g/dL (ref 30.0–36.0)
MCV: 82.3 fL (ref 80.0–100.0)
Platelets: 21 10*3/uL — CL (ref 150–400)
RBC: 2.99 MIL/uL — ABNORMAL LOW (ref 3.87–5.11)
RDW: 20.4 % — ABNORMAL HIGH (ref 11.5–15.5)
WBC: 6.5 10*3/uL (ref 4.0–10.5)
nRBC: 13.6 % — ABNORMAL HIGH (ref 0.0–0.2)

## 2020-10-22 LAB — HEPATITIS B SURFACE ANTIGEN: Hepatitis B Surface Ag: NONREACTIVE

## 2020-10-22 NOTE — Progress Notes (Signed)
[facility]  ROUNDING NOTE   Subjective:  Patient seen and evaluated during hemodialysis treatment today. Tolerating well.    Objective:  Vital signs in last 24 hours:  Temperature 97.1 pulse 85 respirations 27 blood pressure 129/83   Physical Exam: General:  No acute distress  Head:  Normocephalic, atraumatic. Moist oral mucosal membranes  Eyes:  Anicteric  Neck:  Supple  Lungs:   Clear to auscultation, normal effort  Heart:  S1S2 no rubs  Abdomen:   Soft, nontender, bowel sounds present  Extremities:  1+ lower extremity edema  Neurologic:  Awake, alert, following commands  Skin:  No acute rash  Access:  IJ PermCath in place    Basic Metabolic Panel: Recent Labs  Lab 10/17/20 0456 10/19/20 0358 10/20/20 0414 10/20/20 0934 10/22/20 0447  NA 131* 129* 129* 129* 130*  K 4.9 4.8 5.5* 4.1 4.6  CL 95* 92* 96* 94* 95*  CO2 23 19* 19* 20* 20*  GLUCOSE 167* 174* 158* 189* 225*  BUN 40* 47* 38* 41* 73*  CREATININE 4.09* 4.05* 3.24* 3.47* 4.61*  CALCIUM 8.2* 8.9 8.8* 8.8* 9.1  MG  --   --  1.9  --   --   PHOS  --  4.5  --   --  5.5*    Liver Function Tests: Recent Labs  Lab 10/19/20 0358 10/21/20 0910 10/22/20 0447  AST  --   --  53*  ALT  --   --  60*  ALKPHOS  --   --  203*  BILITOT  --  5.1* 5.3*  PROT  --   --  8.3*  ALBUMIN 1.7*  --  1.9*  2.0*   No results for input(s): LIPASE, AMYLASE in the last 168 hours. No results for input(s): AMMONIA in the last 168 hours.  CBC: Recent Labs  Lab 10/17/20 0456 10/19/20 0358 10/20/20 0414 10/21/20 0507 10/21/20 0910 10/22/20 0447  WBC 13.8* 11.1* 10.0 8.4  --  6.5  HGB 8.3* 7.6* 7.2* 6.5*  --  8.0*  HCT 25.7* 24.9* 23.3* 20.6*  --  24.6*  MCV 79.3* 81.6 80.6 81.1  --  82.3  PLT 55* 39* 29* 28* 26* 21*    Cardiac Enzymes: No results for input(s): CKTOTAL, CKMB, CKMBINDEX, TROPONINI in the last 168 hours.  BNP: Invalid input(s): POCBNP  CBG: No results for input(s): GLUCAP in the  last 168 hours.  Microbiology: No results found for this or any previous visit.  Coagulation Studies: Recent Labs    10/21/20 0910  LABPROT 15.8*  INR 1.3*    Urinalysis: No results for input(s): COLORURINE, LABSPEC, PHURINE, GLUCOSEU, HGBUR, BILIRUBINUR, KETONESUR, PROTEINUR, UROBILINOGEN, NITRITE, LEUKOCYTESUR in the last 72 hours.  Invalid input(s): APPERANCEUR    Imaging: No results found.   Medications:       Assessment/ Plan:  60 y.o. 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   1.  ESRD on HD.  Patient seen and evaluated during hemodialysis treatment today.  Tolerating well.  Ultrafiltration target increased to 2.5 kg.  2.  Anemia of chronic kidney disease.   Lab Results  Component Value Date   HGB 8.0 (L) 10/22/2020   Hemoglobin  up to 8.0.  Received blood transfusion recently.  Continue Retacrit 10,000 units IV with dialysis treatments.  3.  Secondary hyperparathyroidism.  Phosphorus up to 5.5.  Should come down with dialysis treatment today.  4.  Thrombocytopenia.  Platelets continue to drop.  Currently 21,000.  Continue to pack catheter with citrate.  5.  Hyponatremia.  Serum sodium 130 today.  Hopefully will stabilize with ongoing dialysis treatments.   LOS: 0 Theresa Russell 2/28/20228:13 AM

## 2020-10-23 ENCOUNTER — Other Ambulatory Visit (HOSPITAL_COMMUNITY): Payer: Medicare Other

## 2020-10-23 ENCOUNTER — Ambulatory Visit (HOSPITAL_COMMUNITY): Payer: No Typology Code available for payment source | Attending: Nurse Practitioner

## 2020-10-23 DIAGNOSIS — M79672 Pain in left foot: Secondary | ICD-10-CM | POA: Diagnosis present

## 2020-10-23 DIAGNOSIS — M79606 Pain in leg, unspecified: Secondary | ICD-10-CM | POA: Diagnosis not present

## 2020-10-23 DIAGNOSIS — M79671 Pain in right foot: Secondary | ICD-10-CM | POA: Diagnosis not present

## 2020-10-23 LAB — RENAL FUNCTION PANEL
Albumin: 1.9 g/dL — ABNORMAL LOW (ref 3.5–5.0)
Anion gap: 14 (ref 5–15)
BUN: 53 mg/dL — ABNORMAL HIGH (ref 6–20)
CO2: 22 mmol/L (ref 22–32)
Calcium: 8.6 mg/dL — ABNORMAL LOW (ref 8.9–10.3)
Chloride: 96 mmol/L — ABNORMAL LOW (ref 98–111)
Creatinine, Ser: 3.59 mg/dL — ABNORMAL HIGH (ref 0.44–1.00)
GFR, Estimated: 14 mL/min — ABNORMAL LOW (ref >60)
Glucose, Bld: 223 mg/dL — ABNORMAL HIGH (ref 70–99)
Phosphorus: 4.2 mg/dL (ref 2.5–4.6)
Potassium: 4.2 mmol/L (ref 3.5–5.1)
Sodium: 132 mmol/L — ABNORMAL LOW (ref 135–145)

## 2020-10-23 LAB — CBC
HCT: 25.6 % — ABNORMAL LOW (ref 36.0–46.0)
Hemoglobin: 7.9 g/dL — ABNORMAL LOW (ref 12.0–15.0)
MCH: 25.6 pg — ABNORMAL LOW (ref 26.0–34.0)
MCHC: 30.9 g/dL (ref 30.0–36.0)
MCV: 83.1 fL (ref 80.0–100.0)
Platelets: 22 10*3/uL — CL (ref 150–400)
RBC: 3.08 MIL/uL — ABNORMAL LOW (ref 3.87–5.11)
RDW: 21.2 % — ABNORMAL HIGH (ref 11.5–15.5)
WBC: 8.3 10*3/uL (ref 4.0–10.5)
nRBC: 14.7 % — ABNORMAL HIGH (ref 0.0–0.2)

## 2020-10-23 LAB — MAGNESIUM: Magnesium: 2 mg/dL (ref 1.7–2.4)

## 2020-10-23 NOTE — Progress Notes (Signed)
ABI has been completed.   Preliminary results in CV Proc.   Theresa Russell 10/23/2020 12:37 PM

## 2020-10-24 LAB — RENAL FUNCTION PANEL
Albumin: 2.1 g/dL — ABNORMAL LOW (ref 3.5–5.0)
Albumin: 2.1 g/dL — ABNORMAL LOW (ref 3.5–5.0)
Anion gap: 11 (ref 5–15)
Anion gap: 13 (ref 5–15)
BUN: 73 mg/dL — ABNORMAL HIGH (ref 6–20)
BUN: 75 mg/dL — ABNORMAL HIGH (ref 6–20)
CO2: 24 mmol/L (ref 22–32)
CO2: 23 mmol/L (ref 22–32)
Calcium: 9 mg/dL (ref 8.9–10.3)
Calcium: 9 mg/dL (ref 8.9–10.3)
Chloride: 96 mmol/L — ABNORMAL LOW (ref 98–111)
Chloride: 96 mmol/L — ABNORMAL LOW (ref 98–111)
Creatinine, Ser: 4.31 mg/dL — ABNORMAL HIGH (ref 0.44–1.00)
Creatinine, Ser: 4.39 mg/dL — ABNORMAL HIGH (ref 0.44–1.00)
GFR, Estimated: 11 mL/min — ABNORMAL LOW (ref >60)
GFR, Estimated: 11 mL/min — ABNORMAL LOW (ref >60)
Glucose, Bld: 227 mg/dL — ABNORMAL HIGH (ref 70–99)
Glucose, Bld: 215 mg/dL — ABNORMAL HIGH (ref 70–99)
Phosphorus: 3.8 mg/dL (ref 2.5–4.6)
Phosphorus: 3.5 mg/dL (ref 2.5–4.6)
Potassium: 4.3 mmol/L (ref 3.5–5.1)
Potassium: 4.2 mmol/L (ref 3.5–5.1)
Sodium: 131 mmol/L — ABNORMAL LOW (ref 135–145)
Sodium: 132 mmol/L — ABNORMAL LOW (ref 135–145)

## 2020-10-24 LAB — CBC
HCT: 25.7 % — ABNORMAL LOW (ref 36.0–46.0)
Hemoglobin: 7.9 g/dL — ABNORMAL LOW (ref 12.0–15.0)
MCH: 26.2 pg (ref 26.0–34.0)
MCHC: 30.7 g/dL (ref 30.0–36.0)
MCV: 85.4 fL (ref 80.0–100.0)
Platelets: 35 10*3/uL — ABNORMAL LOW (ref 150–400)
RBC: 3.01 MIL/uL — ABNORMAL LOW (ref 3.87–5.11)
RDW: 20.6 % — ABNORMAL HIGH (ref 11.5–15.5)
WBC: 12.1 10*3/uL — ABNORMAL HIGH (ref 4.0–10.5)
nRBC: 15.1 % — ABNORMAL HIGH (ref 0.0–0.2)

## 2020-10-24 LAB — MAGNESIUM: Magnesium: 2 mg/dL (ref 1.7–2.4)

## 2020-10-24 LAB — PATHOLOGIST SMEAR REVIEW

## 2020-10-24 NOTE — Progress Notes (Signed)
[facility]  ROUNDING NOTE   Subjective:  Patient resting comfortably in bed. Due for hemodialysis treatment later today. No acute complaints. States that she wants to go home soon.   Objective:  Vital signs in last 24 hours:  Temperature 98.2 pulse 78 respirations 24 blood pressure 138/92   Physical Exam: General:  No acute distress  Head:  Normocephalic, atraumatic. Moist oral mucosal membranes  Eyes:  Anicteric  Neck:  Supple  Lungs:   Clear to auscultation, normal effort  Heart:  S1S2 no rubs  Abdomen:   Soft, nontender, bowel sounds present  Extremities:  No lower extremity edema  Neurologic:  Awake, alert, following commands  Skin:  No acute rash  Access:  IJ PermCath in place    Basic Metabolic Panel: Recent Labs  Lab 10/19/20 0358 10/20/20 0414 10/20/20 0934 10/22/20 0447 10/23/20 0431 10/24/20 0552  NA 129* 129* 129* 130* 132* 131*  K 4.8 5.5* 4.1 4.6 4.2 4.3  CL 92* 96* 94* 95* 96* 96*  CO2 19* 19* 20* 20* 22 24  GLUCOSE 174* 158* 189* 225* 223* 227*  BUN 47* 38* 41* 73* 53* 73*  CREATININE 4.05* 3.24* 3.47* 4.61* 3.59* 4.31*  CALCIUM 8.9 8.8* 8.8* 9.1 8.6* 9.0  MG  --  1.9  --   --  2.0 2.0  PHOS 4.5  --   --  5.5* 4.2 3.8    Liver Function Tests: Recent Labs  Lab 10/19/20 0358 10/21/20 0910 10/22/20 0447 10/23/20 0431 10/24/20 0552  AST  --   --  53*  --   --   ALT  --   --  60*  --   --   ALKPHOS  --   --  203*  --   --   BILITOT  --  5.1* 5.3*  --   --   PROT  --   --  8.3*  --   --   ALBUMIN 1.7*  --  1.9*  2.0* 1.9* 2.1*   No results for input(s): LIPASE, AMYLASE in the last 168 hours. No results for input(s): AMMONIA in the last 168 hours.  CBC: Recent Labs  Lab 10/20/20 0414 10/21/20 0507 10/21/20 0910 10/22/20 0447 10/23/20 0431 10/24/20 0552  WBC 10.0 8.4  --  6.5 8.3 12.1*  HGB 7.2* 6.5*  --  8.0* 7.9* 7.9*  HCT 23.3* 20.6*  --  24.6* 25.6* 25.7*  MCV 80.6 81.1  --  82.3 83.1 85.4  PLT 29* 28* 26* 21*  22* 35*    Cardiac Enzymes: No results for input(s): CKTOTAL, CKMB, CKMBINDEX, TROPONINI in the last 168 hours.  BNP: Invalid input(s): POCBNP  CBG: No results for input(s): GLUCAP in the last 168 hours.  Microbiology: No results found for this or any previous visit.  Coagulation Studies: Recent Labs    10/21/20 0910  LABPROT 15.8*  INR 1.3*    Urinalysis: No results for input(s): COLORURINE, LABSPEC, PHURINE, GLUCOSEU, HGBUR, BILIRUBINUR, KETONESUR, PROTEINUR, UROBILINOGEN, NITRITE, LEUKOCYTESUR in the last 72 hours.  Invalid input(s): APPERANCEUR    Imaging: DG CHEST PORT 1 VIEW  Result Date: 10/23/2020 CLINICAL DATA:  Pneumonia EXAM: PORTABLE CHEST 1 VIEW COMPARISON:  10/18/2020 FINDINGS: A left IJ line has been shortened with tip overlapping the left mediastinum. Dialysis catheter on the right with tips at the right atrium. Enteric tube with tip at least reaching the stomach. Cardiomegaly. Low volume chest with perihilar indistinctness, likely vascular congestion. IMPRESSION: Shortened left IJ line with  tip near the left brachiocephalic vein. History of pneumonia with stable perihilar indistinctness. Vascular congestion may be present. Electronically Signed   By: Monte Fantasia M.D.   On: 10/23/2020 06:14   VAS [place] ABI WITH/WO TBI  Result Date: 10/23/2020 LOWER EXTREMITY DOPPLER STUDY Indications: Rest pain, and ulceration.  Comparison Study: no prior Performing Technologist: Abram Sander RVS  Examination Guidelines: A complete evaluation includes at minimum, Doppler waveform signals and systolic blood pressure reading at the level of bilateral brachial, anterior tibial, and posterior tibial arteries, when vessel segments are accessible. Bilateral testing is considered an integral part of a complete examination. Photoelectric Plethysmograph (PPG) waveforms and toe systolic pressure readings are included as required and additional duplex testing as needed. Limited examinations  for reoccurring indications may be performed as noted.  ABI Findings: +-----+------------------+-----+--------+--------+ RightRt Pressure (mmHg)IndexWaveformComment  +-----+------------------+-----+--------+--------+ PTA  255               1.83                  +-----+------------------+-----+--------+--------+ DP   255               1.83                  +-----+------------------+-----+--------+--------+ +--------+------------------+-----+--------+-----------+ Left    Lt Pressure (mmHg)IndexWaveformComment     +--------+------------------+-----+--------+-----------+ [id number]                                        +--------+------------------+-----+--------+-----------+ PTA                                    not audible +--------+------------------+-----+--------+-----------+ DP      255               1.83                     +--------+------------------+-----+--------+-----------+  Summary: Right: Resting right ankle-brachial index indicates noncompressible right lower extremity arteries. ABIs are unreliable. Right av fistula. Left: Resting left ankle-brachial index indicates noncompressible left lower extremity arteries.  *See table(s) above for measurements and observations.  Electronically signed by Curt Jews MD on 10/23/2020 at 96:19:45 PM.   Final      Medications:       Assessment/ Plan:  60 y.o. 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   1.  ESRD on HD.  Patient due for hemodialysis treatment today.  Orders have been prepared.  2.  Anemia of chronic kidney disease.   Lab Results  Component Value  Date   HGB 7.9 (L) 10/24/2020   Continue Retacrit 10,000 units IV with dialysis treatments.  3.  Secondary hyperparathyroidism.  Phosphorus at target at 3.8.  4.  Thrombocytopenia.  Platelets up a bit to 35,000.  We are packing the catheter with citrate.  5.  Hyponatremia.  Serum sodium remained stable in the low 130s.  Continue to periodically monitor..   LOS: 0 Munsoor Lateef 3/2/20228:25 AM

## 2020-10-25 ENCOUNTER — Other Ambulatory Visit (HOSPITAL_COMMUNITY): Payer: Medicare Other

## 2020-10-25 LAB — CBC
HCT: 25.3 % — ABNORMAL LOW (ref 36.0–46.0)
Hemoglobin: 7.7 g/dL — ABNORMAL LOW (ref 12.0–15.0)
MCH: 26 pg (ref 26.0–34.0)
MCHC: 30.4 g/dL (ref 30.0–36.0)
MCV: 85.5 fL (ref 80.0–100.0)
Platelets: 59 10*3/uL — ABNORMAL LOW (ref 150–400)
RBC: 2.96 MIL/uL — ABNORMAL LOW (ref 3.87–5.11)
RDW: 20.6 % — ABNORMAL HIGH (ref 11.5–15.5)
WBC: 13.4 10*3/uL — ABNORMAL HIGH (ref 4.0–10.5)
nRBC: 25.5 % — ABNORMAL HIGH (ref 0.0–0.2)

## 2020-10-26 LAB — CBC
HCT: 26.1 % — ABNORMAL LOW (ref 36.0–46.0)
Hemoglobin: 7.9 g/dL — ABNORMAL LOW (ref 12.0–15.0)
MCH: 26.2 pg (ref 26.0–34.0)
MCHC: 30.3 g/dL (ref 30.0–36.0)
MCV: 86.7 fL (ref 80.0–100.0)
Platelets: 137 10*3/uL — ABNORMAL LOW (ref 150–400)
RBC: 3.01 MIL/uL — ABNORMAL LOW (ref 3.87–5.11)
RDW: 21.2 % — ABNORMAL HIGH (ref 11.5–15.5)
WBC: 20.6 10*3/uL — ABNORMAL HIGH (ref 4.0–10.5)
nRBC: 36.1 % — ABNORMAL HIGH (ref 0.0–0.2)

## 2020-10-26 LAB — RENAL FUNCTION PANEL
Albumin: 2.4 g/dL — ABNORMAL LOW (ref 3.5–5.0)
Anion gap: 13 (ref 5–15)
BUN: 73 mg/dL — ABNORMAL HIGH (ref 6–20)
CO2: 25 mmol/L (ref 22–32)
Calcium: 9 mg/dL (ref 8.9–10.3)
Chloride: 93 mmol/L — ABNORMAL LOW (ref 98–111)
Creatinine, Ser: 3.93 mg/dL — ABNORMAL HIGH (ref 0.44–1.00)
GFR, Estimated: 13 mL/min — ABNORMAL LOW (ref >60)
Glucose, Bld: 238 mg/dL — ABNORMAL HIGH (ref 70–99)
Phosphorus: 2.4 mg/dL — ABNORMAL LOW (ref 2.5–4.6)
Potassium: 4.3 mmol/L (ref 3.5–5.1)
Sodium: 131 mmol/L — ABNORMAL LOW (ref 135–145)

## 2020-10-26 NOTE — Progress Notes (Signed)
[facility]  ROUNDING NOTE   Subjective:  Patient being repositioned in the bed this a.m. Due for hemodialysis treatment later today. Still with anemia with hemoglobin of 7.9. Has underlying pelvic mass.  Objective:  Vital signs in last 24 hours:  Temperature 98.4 pulse 85 respirations 15 blood pressure 144/89   Physical Exam: General:  No acute distress  Head:  Normocephalic, atraumatic. Moist oral mucosal membranes  Eyes:  Anicteric  Neck:  Supple  Lungs:   Clear to auscultation, normal effort  Heart:  S1S2 no rubs  Abdomen:   Soft, nontender, bowel sounds present  Extremities:  No lower extremity edema  Neurologic:  Awake, alert, following commands  Skin:  No acute rash  Access:  IJ PermCath in place    Basic Metabolic Panel: Recent Labs  Lab 10/20/20 0414 10/20/20 0934 10/22/20 0447 10/23/20 0431 10/24/20 0552 10/24/20 0744 10/26/20 0521  NA 129*   < > 130* 132* 131* 132* 131*  K 5.5*   < > 4.6 4.2 4.3 4.2 4.3  CL 96*   < > 95* 96* 96* 96* 93*  CO2 19*   < > 20* 22 24 23 25   GLUCOSE 158*   < > 225* 223* 227* 215* 238*  BUN 38*   < > 73* 53* 73* 75* 73*  CREATININE 3.24*   < > 4.61* 3.59* 4.31* 4.39* 3.93*  CALCIUM 8.8*   < > 9.1 8.6* 9.0 9.0 9.0  MG 1.9  --   --  2.0 2.0  --   --   PHOS  --   --  5.5* 4.2 3.8 3.5 2.4*   < > = values in this interval not displayed.    Liver Function Tests: Recent Labs  Lab 10/21/20 0910 10/22/20 0447 10/23/20 0431 10/24/20 0552 10/24/20 0744 10/26/20 0521  AST  --  53*  --   --   --   --   ALT  --  60*  --   --   --   --   ALKPHOS  --  203*  --   --   --   --   BILITOT 5.1* 5.3*  --   --   --   --   PROT  --  8.3*  --   --   --   --   ALBUMIN  --  1.9*  2.0* 1.9* 2.1* 2.1* 2.4*   No results for input(s): LIPASE, AMYLASE in the last 168 hours. No results for input(s): AMMONIA in the last 168 hours.  CBC: Recent Labs  Lab 10/22/20 0447 10/23/20 0431 10/24/20 0552 10/25/20 0449 10/26/20 0521   WBC 6.5 8.3 12.1* 13.4* 20.6*  HGB 8.0* 7.9* 7.9* 7.7* 7.9*  HCT 24.6* 25.6* 25.7* 25.3* 26.1*  MCV 82.3 83.1 85.4 85.5 86.7  PLT 21* 22* 35* 59* 137*    Cardiac Enzymes: No results for input(s): CKTOTAL, CKMB, CKMBINDEX, TROPONINI in the last 168 hours.  BNP: Invalid input(s): POCBNP  CBG: No results for input(s): GLUCAP in the last 168 hours.  Microbiology: No results found for this or any previous visit.  Coagulation Studies: No results for input(s): LABPROT, INR in the last 72 hours.  Urinalysis: No results for input(s): COLORURINE, LABSPEC, PHURINE, GLUCOSEU, HGBUR, BILIRUBINUR, KETONESUR, PROTEINUR, UROBILINOGEN, NITRITE, LEUKOCYTESUR in the last 72 hours.  Invalid input(s): APPERANCEUR    Imaging: CT ABDOMEN PELVIS WO CONTRAST  Result Date: 10/25/2020 CLINICAL DATA:  Intra-abdominal abscess EXAM: CT ABDOMEN AND PELVIS WITHOUT CONTRAST  TECHNIQUE: Multidetector CT imaging of the abdomen and pelvis was performed following the standard protocol without IV contrast. COMPARISON:  None. FINDINGS: Lower chest: At least small right pleural effusion is present with compressive atelectasis of the right lung base. Mild left basilar atelectasis. Central venous catheter tip noted within the right atrium. Extensive coronary artery calcification. Cardiac size is mildly enlarged. Hepatobiliary: No focal liver abnormality is seen. No gallstones, gallbladder wall thickening, or biliary dilatation. Pancreas: Unremarkable Spleen: Unremarkable Adrenals/Urinary Tract: The adrenal glands are unremarkable. The kidneys are atrophic bilaterally, but are otherwise unremarkable. The bladder is decompressed and is unremarkable. Stomach/Bowel: There is mild ascites present. Nasogastric tube extends into the gastric antrum. Stomach, small bowel, and large bowel are unremarkable. Appendix normal. No free intraperitoneal gas. Vascular/Lymphatic: Mild aortoiliac atherosclerotic calcification. Extensive  arteriosclerosis involving the pelvic vasculature, visualized lower extremity arterial outflow, and inferior epigastric vasculature. No aortic aneurysm. No pathologic adenopathy within the abdomen and pelvis. Reproductive: A macroscopic fat containing lesion is seen within the left ovary demonstrating layering soft tissue nodules in keeping with an open "sac of marbles" sign and compatible with a mature cystic teratoma/dermoid cyst. This measures 10.3 x 12.9 x 10.5 cm. The uterus is unremarkable. The right adnexa is unremarkable. Other: There is extensive diffuse subcutaneous edema noted throughout the body wall. Small fat containing umbilical hernia. Rectum unremarkable. Musculoskeletal: Degenerative changes noted within the lumbar spine. No lytic or blastic bone lesions. Diffuse sclerosis with subchondral lucency throughout the lumbar spine is in keeping with renal osteodystrophy. IMPRESSION: No acute intra-abdominal pathology identified. 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 Gynecologic consultation may be helpful for further management given the size of the lesion and potential for torsion. Anasarca with extensive subcutaneous body wall edema, mild ascites, and small right pleural effusion. Aortic Atherosclerosis (ICD10-I70.0). Electronically Signed   By: Fidela Salisbury MD   On: 10/25/2020 01:53   [place] LT LOWER EXTREM LTD SOFT TISSUE NON VASCULAR  Result Date: 10/25/2020 CLINICAL DATA:  Abscess of the left thigh EXAM: ULTRASOUND LEFT LOWER EXTREMITY LIMITED TECHNIQUE: Ultrasound examination of the lower extremity soft tissues was performed in the area of clinical concern. COMPARISON:  None. FINDINGS: At the location of the patient's concern in the medial left thigh there is a complex fluid collection measuring at least 4 cm in length. There is subcutaneous edema in the surrounding soft tissues. There is overlying skin thickening. There is no definite internal color Doppler flow within this  collection. IMPRESSION: In the medial left thigh, there is a complex collection measuring at least 4 cm that appears to be dissecting through the subcutaneous fat. This may represent an abscess or phlegmon in the appropriate clinical setting. Electronically Signed   By: Constance Holster M.D.   On: 10/25/2020 18:33   US PELVIC COMPLETE W TRANSVAGINAL AND TORSION R/O  Result Date: 10/25/2020 CLINICAL DATA:  Cystic teratoma of the left ovary. EXAM: TRANSABDOMINAL AND TRANSVAGINAL ULTRASOUND OF PELVIS DOPPLER ULTRASOUND OF OVARIES TECHNIQUE: Both transabdominal and transvaginal ultrasound examinations of the pelvis were performed. Transabdominal technique was performed for global imaging of the pelvis including uterus, ovaries, adnexal regions, and pelvic cul-de-sac. It was necessary to proceed with endovaginal exam following the transabdominal exam to visualize the ovaries. Color and duplex Doppler ultrasound was utilized to evaluate blood flow to the ovaries. COMPARISON:  CT from the same day FINDINGS: Uterus Measurements: 7.7 x 4.8 x 5.8 cm = volume: 112 mL. No fibroids or other mass visualized. Endometrium Thickness:  8 mm.  No focal abnormality visualized. Right ovary Not seen Left ovary Measurements: 13.2 x 9.5 x 11.8 cm = volume: 777 mL. There is a mass with heterogeneous internal echoes and color Doppler flow. Normal in arteriovenous waveforms were not visualized involving the patient's left ovary. There appears to be some flow which may be venous in nature. Other findings There is a moderate amount of pelvic free fluid IMPRESSION: 1. Right ovary not visualized. 2. Again identified is a mass involving the patient's left ovary, better characterized on the patient's recent CT. This mass contains internal color Doppler flow consistent with a soft tissue mass. 3. Normal venous and arterial waveforms could not be identified involving the left ovary. This is felt to be secondary to the presence of the large mass,  although ovarian torsion is not excluded especially if the patient reports abdominal pain. 4. Moderate volume free fluid noted in the patient's pelvis. Electronically Signed   By: Constance Holster M.D.   On: 10/25/2020 18:32     Medications:       Assessment/ Plan:  60 y.o. 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   1.  ESRD on HD.  We will maintain the patient on MWF dialysis schedule.  2.  Anemia of chronic kidney disease.   Lab Results  Component Value Date   HGB 7.9 (L) 10/26/2020   Maintain the patient on Retacrit 10,000 units IV with dialysis.  3.  Secondary hyperparathyroidism.  Phosphorus 2.4 today.  Continue to periodically monitor.  4.  Thrombocytopenia.  Platelets up to 137,000.  5.  Hyponatremia.  Serum sodium 131 today.  Continue to monitor.   LOS: 0 Ayoub Arey 3/4/20228:07 AM

## 2020-10-27 LAB — COMPREHENSIVE METABOLIC PANEL
ALT: 114 U/L — ABNORMAL HIGH (ref 0–44)
AST: 86 U/L — ABNORMAL HIGH (ref 15–41)
Albumin: 2.4 g/dL — ABNORMAL LOW (ref 3.5–5.0)
Alkaline Phosphatase: 272 U/L — ABNORMAL HIGH (ref 38–126)
Anion gap: 10 (ref 5–15)
BUN: 46 mg/dL — ABNORMAL HIGH (ref 6–20)
CO2: 26 mmol/L (ref 22–32)
Calcium: 8.8 mg/dL — ABNORMAL LOW (ref 8.9–10.3)
Chloride: 99 mmol/L (ref 98–111)
Creatinine, Ser: 2.88 mg/dL — ABNORMAL HIGH (ref 0.44–1.00)
GFR, Estimated: 18 mL/min — ABNORMAL LOW (ref >60)
Glucose, Bld: 211 mg/dL — ABNORMAL HIGH (ref 70–99)
Potassium: 3.8 mmol/L (ref 3.5–5.1)
Sodium: 135 mmol/L (ref 135–145)
Total Bilirubin: 3.3 mg/dL — ABNORMAL HIGH (ref 0.3–1.2)
Total Protein: 7.3 g/dL (ref 6.5–8.1)

## 2020-10-27 LAB — CBC
HCT: 26.1 % — ABNORMAL LOW (ref 36.0–46.0)
Hemoglobin: 7.8 g/dL — ABNORMAL LOW (ref 12.0–15.0)
MCH: 27 pg (ref 26.0–34.0)
MCHC: 29.9 g/dL — ABNORMAL LOW (ref 30.0–36.0)
MCV: 90.3 fL (ref 80.0–100.0)
Platelets: 197 10*3/uL (ref 150–400)
RBC: 2.89 MIL/uL — ABNORMAL LOW (ref 3.87–5.11)
RDW: 22.8 % — ABNORMAL HIGH (ref 11.5–15.5)
WBC: 22.7 10*3/uL — ABNORMAL HIGH (ref 4.0–10.5)
nRBC: 83.5 % — ABNORMAL HIGH (ref 0.0–0.2)

## 2020-10-28 LAB — CBC
HCT: 26.3 % — ABNORMAL LOW (ref 36.0–46.0)
Hemoglobin: 8 g/dL — ABNORMAL LOW (ref 12.0–15.0)
MCH: 27.7 pg (ref 26.0–34.0)
MCHC: 30.4 g/dL (ref 30.0–36.0)
MCV: 91 fL (ref 80.0–100.0)
Platelets: 256 10*3/uL (ref 150–400)
RBC: 2.89 MIL/uL — ABNORMAL LOW (ref 3.87–5.11)
RDW: 25.2 % — ABNORMAL HIGH (ref 11.5–15.5)
WBC: 20 10*3/uL — ABNORMAL HIGH (ref 4.0–10.5)
nRBC: 102.2 % — ABNORMAL HIGH (ref 0.0–0.2)

## 2020-10-28 NOTE — Progress Notes (Signed)
PROGRESS NOTE    Theresa Russell  DJM:[id number] DOB: Nov 07, 1960 DOA: 10/16/2020   Brief Narrative: Theresa Russell is an 60 y.o. female with multiple medical problems including ESRD on dialysis, chronic hypoxemic respiratory failure secondary to COPD, hypertension, hyperlipidemia, coronary disease, diabetes mellitus who apparently was admitted to [facility] when she was brought in by EMS with altered mental status, generalized weakness.  In the emergency room she was found to be tachycardic with heart rate in the 140s and atrial flutter.  Blood pressure was stable.  She was noted to have cellulitis, necrotic wounds to bilateral lower extremities. CT was done and it showed findings concerning for left ischial rectal fossa necrotizing fasciitis.  General surgery was consulted.  They initially recommended transfer to tertiary care center.  She was started on broad-spectrum antimicrobials.  However, transfer was on unsuccessful.  Patient was taken to surgery and underwent incision and drainage of the left perineal and ischio rectal abscess measuring about 5 x 5 x 10 cm.  She subsequently developed septic shock requiring vasopressors.  She was given aggressive hydration and broad-spectrum antimicrobial coverage.  Her wound cultures grew Morganella, E. coli, MRSA.  She was initially treated with IV Zosyn, clindamycin. Nephrology was consulted and patient was receiving hemodialysis.  She also had leukocytosis which improved initially but then again there was an upward trend on 10/11/2020.  She underwent repeat imaging of her pelvis which showed concern for necrotizing fasciitis involving the left thigh extending into the groin.  General surgery was consulted again and patient underwent bedside debridement.  After the procedure her WBC count improved.  Patient failed swallow study therefore placed on tube feeding.  Due to her complex medical problems she was transferred and admitted to [facility] on 10/16/2020.    Here she was treated with IV cefepime, Zyvox.  However, she had worsening thrombocytopenia with drop in Platelet count as low as 29.  Therefore recommended to switch to doxycycline.  She completed treatment with the cefepime.  However, she had worsening leukocytosis and developed left groin cellulitis.  CT of the abdomen and pelvis without contrast was done on 10/25/2020 per report 12.9 cm mature cystic teratoma within the left ovary.  Also reported was anasarca with extensive subcutaneous body wall edema, mild ascites, small right pleural effusion.  Due to her worsening cellulitis left thigh ultrasound was done which per report showed complex collection 4 cm dissecting through the subcutaneous fat.  Likely abscess/phlegmon.   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  Likely secondary to volume overload/pulmonary edema/pleural effusion, COPD with acute exacerbation.  She is currently on oxygen by nasal cannula.  She has been on treatment with Zyvox, cefepime.  However, had worsening thrombocytopenia.  Therefore recommended to discontinue the Zyvox and switch to doxycycline and complete the treatment.  She completed treatment with cefepime.   Due to the left thigh abscess/phlegmon suggested to start her on meropenem. She also has dysphagia and high risk for  aspiration and aspiration pneumonia.  Continue to monitor closely.  Left ischio rectal  fossa necrotizing fasciitis status post debridement/perirectal abscess: She had debridement done and she also had drainage of the perirectal abscess.  She had cultures that showed Morganella morganii, E. coli, MRSA at the outside facility.  She received treatment with IV cefepime, Zyvox.  Unfortunately had worsening thrombocytopenia.  Therefore recommended to DC the Zyvox and switch to doxycycline. She completed treatment with cefepime.  However, she had left thigh abscess/phlegmon therefore meropenem added.  For now continue the doxycycline for MRSA coverage. Continue local wound care.  If wound worsening consider consulting surgery to evaluate.  Severe sepsis with septic shock, currently shock resolved: In the setting of necrotizing fasciitis/perirectal abscess.  She required pressors at the outside facility.  Her blood pressure stable.  Antibiotics restarted as mentioned above.  She is high risk for recurrent sepsis. Continue to monitor closely.  Bilateral lower extremity wounds: Continue local wound care.    Left thigh abscess/phlegmon: Left thigh area indurated with tenderness.  Ultrasound showing probable abscess/phlegmon.  On physical examination its not fluctuant therefore most likely phlegmon.  Suggested to start her on meropenem.  We will plan to treat for duration of 7-10 days pending improvement. Continue doxycycline for MRSA coverage.  Suggest warm compresses to liquefy the phlegmon.  Once it becomes fluctuant suggest consulting surgery for incision and drainage.  Please send for cultures of drainage.  Please monitor BUN/creatinine while on antibiotics.  Antibiotics renally dosed.  Thrombocytopenia: Exact etiology for the thrombocytopenia is unclear.  However, having worsening platelet count.  HIT panel was negative per the primary team.    She received treatment with steroids.  Currently platelet count  improved.  Suggest to taper the steroids in the setting of the left thigh abscess/phlegmon.  End-stage renal disease on hemodialysis: Dialysis per nephrology team.  Medications renally dosed.  Diabetes mellitus type 2: Continue to monitor Accu-Cheks, medications and management of diabetes per the primary team.  She will need proper glycemic control in order to enable wound healing.  COPD: Continue to monitor, management per the primary team.  Left ovarian cystic teratoma: She is high risk for torsion.  Further management per primary team.  Dysphagia/protein calorie malnutrition: Has NG tube in place.  On PEG tube feeds.  Due to her dysphagia she is very high risk for aspiration and recurrent aspiration pneumonia.  Encephalopathy: Likely toxic/metabolic.  Continue supportive management per the primary team.  Chronic systolic congestive heart failure with EF around 40%: Continue medication and management per the primary team.  She was also noted to have pulmonary hypertension on the echocardiogram.  Unfortunately due to her complex medical problems she is very high risk for worsening and decompensation.  Plan of care discussed with the primary team and pharmacy.  Subjective: She is having leukocytosis.  Left thigh area induration with tenderness, increased warmth.  Objective: Vitals: Temperature 98.4, pulse 87, respiratory rate 22, blood pressure 148/91, pulse oximetry 100%  Examination: Constitutional: Chronically ill-appearing female Head: Atraumatic, normocephalic Eyes: PERLA, EOMI  ENMT: Has NG tube in place, external ears and nose appear normal, normal hearing, Lips appears normal, moist oral mucosa Neck: neck appears normal, no masses CVS: S1-S2  Respiratory: Decreased breath sounds lower lobes, occasional rhonchi, no wheezing Abdomen: Obese, soft, nontender,normal bowel sounds, lower abdominal wall edema Musculoskeletal: Bilateral lower extremity wounds with dressing in  place, left thigh area induration, increased warmth, tenderness.   Neuro: Severe debility with generalized weakness Psych: Encephalopathic Skin: Bilateral lower extremity wounds, left ischial rectal area wound status post surgical  debridement    Data Reviewed: I have personally reviewed following labs and imaging studies  CBC: Recent Labs  Lab 10/24/20 0552 10/25/20 0449 10/26/20 0521 10/27/20 0416 10/28/20 0334  WBC 12.1* 13.4* 20.6* 22.7* 20.0*  HGB 7.9* 7.7* 7.9* 7.8* 8.0*  HCT 25.7* 25.3* 26.1* 26.1* 26.3*  MCV 85.4 85.5 86.7 90.3 91.0  PLT 35* 59* 137* 197 852    Basic Metabolic Panel: Recent Labs  Lab 10/22/20 0447 10/23/20 0431 10/24/20 0552 10/24/20 0744 10/26/20 0521 10/27/20 0416  NA 130* 132* 131* 132* 131* 135  K 4.6 4.2 4.3 4.2 4.3 3.8  CL 95* 96* 96* 96* 93* 99  CO2 20* 22 24 23 25 26   GLUCOSE 225* 223* 227* 215* 238* 211*  BUN 73* 53* 73* 75* 73* 46*  CREATININE 4.61* 3.59* 4.31* 4.39* 3.93* 2.88*  CALCIUM 9.1 8.6* 9.0 9.0 9.0 8.8*  MG  --  2.0 2.0  --   --   --   PHOS 5.5* 4.2 3.8 3.5 2.4*  --     GFR: CrCl cannot be calculated (Unknown ideal weight.).  Liver Function Tests: Recent Labs  Lab 10/22/20 0447 10/23/20 0431 10/24/20 0552 10/24/20 0744 10/26/20 0521 10/27/20 0416  AST 53*  --   --   --   --  86*  ALT 60*  --   --   --   --  114*  ALKPHOS 203*  --   --   --   --  272*  BILITOT 5.3*  --   --   --   --  3.3*  PROT 8.3*  --   --   --   --  7.3  ALBUMIN 1.9*  2.0* 1.9* 2.1* 2.1* 2.4* 2.4*    CBG: No results for input(s): GLUCAP in the last 168 hours.   No results found for this or any previous visit (from the past 240 hour(s)).   Scheduled Meds: Please see MAR   Yaakov Guthrie, MD   10/28/2020, 7:44 PM

## 2020-10-29 LAB — RENAL FUNCTION PANEL
Albumin: 2.6 g/dL — ABNORMAL LOW (ref 3.5–5.0)
Anion gap: 14 (ref 5–15)
BUN: 104 mg/dL — ABNORMAL HIGH (ref 6–20)
CO2: 22 mmol/L (ref 22–32)
Calcium: 8.7 mg/dL — ABNORMAL LOW (ref 8.9–10.3)
Chloride: 99 mmol/L (ref 98–111)
Creatinine, Ser: 4.35 mg/dL — ABNORMAL HIGH (ref 0.44–1.00)
GFR, Estimated: 11 mL/min — ABNORMAL LOW (ref >60)
Glucose, Bld: 228 mg/dL — ABNORMAL HIGH (ref 70–99)
Phosphorus: 4.8 mg/dL — ABNORMAL HIGH (ref 2.5–4.6)
Potassium: 4.8 mmol/L (ref 3.5–5.1)
Sodium: 135 mmol/L (ref 135–145)

## 2020-10-29 LAB — CBC
HCT: 26.9 % — ABNORMAL LOW (ref 36.0–46.0)
Hemoglobin: 7.8 g/dL — ABNORMAL LOW (ref 12.0–15.0)
MCH: 28.1 pg (ref 26.0–34.0)
MCHC: 29 g/dL — ABNORMAL LOW (ref 30.0–36.0)
MCV: 96.8 fL (ref 80.0–100.0)
Platelets: 274 10*3/uL (ref 150–400)
RBC: 2.78 MIL/uL — ABNORMAL LOW (ref 3.87–5.11)
RDW: 27.8 % — ABNORMAL HIGH (ref 11.5–15.5)
WBC: 18 10*3/uL — ABNORMAL HIGH (ref 4.0–10.5)
nRBC: 124.3 % — ABNORMAL HIGH (ref 0.0–0.2)

## 2020-10-29 NOTE — Progress Notes (Signed)
[facility]  ROUNDING NOTE   Subjective:  Patient seen and evaluated during hemodialysis treatment today. Tolerating well.  Objective:  Vital signs in last 24 hours:  Temperature 98.2 pulse 75 respirations 20 blood pressure 145/77   Physical Exam: General:  No acute distress  Head:  Normocephalic, atraumatic. Moist oral mucosal membranes  Eyes:  Anicteric  Neck:  Supple  Lungs:   Clear to auscultation, normal effort  Heart:  S1S2 no rubs  Abdomen:   Soft, nontender, bowel sounds present  Extremities:  No lower extremity edema  Neurologic:  Awake, alert, following commands  Skin:  No acute rash  Access:  IJ PermCath in place    Basic Metabolic Panel: Recent Labs  Lab 10/23/20 0431 10/24/20 0552 10/24/20 0744 10/26/20 0521 10/27/20 0416 10/29/20 0544  NA 132* 131* 132* 131* 135 135  K 4.2 4.3 4.2 4.3 3.8 4.8  CL 96* 96* 96* 93* 99 99  CO2 22 24 23 25 26 22   GLUCOSE 223* 227* 215* 238* 211* 228*  BUN 53* 73* 75* 73* 46* 104*  CREATININE 3.59* 4.31* 4.39* 3.93* 2.88* 4.35*  CALCIUM 8.6* 9.0 9.0 9.0 8.8* 8.7*  MG 2.0 2.0  --   --   --   --   PHOS 4.2 3.8 3.5 2.4*  --  4.8*    Liver Function Tests: Recent Labs  Lab 10/24/20 0552 10/24/20 0744 10/26/20 0521 10/27/20 0416 10/29/20 0544  AST  --   --   --  86*  --   ALT  --   --   --  114*  --   ALKPHOS  --   --   --  272*  --   BILITOT  --   --   --  3.3*  --   PROT  --   --   --  7.3  --   ALBUMIN 2.1* 2.1* 2.4* 2.4* 2.6*   No results for input(s): LIPASE, AMYLASE in the last 168 hours. No results for input(s): AMMONIA in the last 168 hours.  CBC: Recent Labs  Lab 10/25/20 0449 10/26/20 0521 10/27/20 0416 10/28/20 0334 10/29/20 0720  WBC 13.4* 20.6* 22.7* 20.0* 18.0*  HGB 7.7* 7.9* 7.8* 8.0* 7.8*  HCT 25.3* 26.1* 26.1* 26.3* 26.9*  MCV 85.5 86.7 90.3 91.0 96.8  PLT 59* 137* 197 256 274    Cardiac Enzymes: No results for input(s): CKTOTAL, CKMB, CKMBINDEX, TROPONINI in the last  168 hours.  BNP: Invalid input(s): POCBNP  CBG: No results for input(s): GLUCAP in the last 168 hours.  Microbiology: No results found for this or any previous visit.  Coagulation Studies: No results for input(s): LABPROT, INR in the last 72 hours.  Urinalysis: No results for input(s): COLORURINE, LABSPEC, PHURINE, GLUCOSEU, HGBUR, BILIRUBINUR, KETONESUR, PROTEINUR, UROBILINOGEN, NITRITE, LEUKOCYTESUR in the last 72 hours.  Invalid input(s): APPERANCEUR    Imaging: No results found.   Medications:       Assessment/ Plan:  60 y.o. 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   1.  ESRD on HD.  Patient seen and evaluated during hemodialysis treatment.  Tolerating well.  Plan to complete dialysis treatment today.  2.  Anemia of  chronic kidney disease.   Lab Results  Component Value Date   HGB 7.8 (L) 10/29/2020   Maintain the patient on Retacrit 10,000 units IV with dialysis.  3.  Secondary hyperparathyroidism.  Serum phosphorus 4.8 at last check and at target.  4.  Thrombocytopenia.  Platelets normalized to 274,000.  Infectious disease recommended steroids to be stopped.  5.  Hyponatremia.  Improved significantly.  Serum sodium up to 135.   LOS: 0 Theresa Russell 3/7/20228:02 AM

## 2020-10-31 ENCOUNTER — Other Ambulatory Visit (HOSPITAL_COMMUNITY): Payer: Medicare Other

## 2020-10-31 LAB — CBC
HCT: 28.1 % — ABNORMAL LOW (ref 36.0–46.0)
Hemoglobin: 7.9 g/dL — ABNORMAL LOW (ref 12.0–15.0)
MCH: 27.9 pg (ref 26.0–34.0)
MCHC: 28.1 g/dL — ABNORMAL LOW (ref 30.0–36.0)
MCV: 99.3 fL (ref 80.0–100.0)
Platelets: 281 10*3/uL (ref 150–400)
RBC: 2.83 MIL/uL — ABNORMAL LOW (ref 3.87–5.11)
RDW: 29 % — ABNORMAL HIGH (ref 11.5–15.5)
WBC: 20.4 10*3/uL — ABNORMAL HIGH (ref 4.0–10.5)
nRBC: 57.5 % — ABNORMAL HIGH (ref 0.0–0.2)

## 2020-10-31 LAB — RENAL FUNCTION PANEL
Albumin: 2.6 g/dL — ABNORMAL LOW (ref 3.5–5.0)
Anion gap: 11 (ref 5–15)
BUN: 88 mg/dL — ABNORMAL HIGH (ref 6–20)
CO2: 25 mmol/L (ref 22–32)
Calcium: 8.5 mg/dL — ABNORMAL LOW (ref 8.9–10.3)
Chloride: 96 mmol/L — ABNORMAL LOW (ref 98–111)
Creatinine, Ser: 4 mg/dL — ABNORMAL HIGH (ref 0.44–1.00)
GFR, Estimated: 12 mL/min — ABNORMAL LOW (ref >60)
Glucose, Bld: 125 mg/dL — ABNORMAL HIGH (ref 70–99)
Phosphorus: 4.7 mg/dL — ABNORMAL HIGH (ref 2.5–4.6)
Potassium: 4 mmol/L (ref 3.5–5.1)
Sodium: 132 mmol/L — ABNORMAL LOW (ref 135–145)

## 2020-10-31 LAB — AMMONIA: Ammonia: 75 umol/L — ABNORMAL HIGH (ref 9–35)

## 2020-10-31 LAB — LACTIC ACID, PLASMA: Lactic Acid, Venous: 1.5 mmol/L (ref 0.5–1.9)

## 2020-10-31 NOTE — Progress Notes (Signed)
[facility]  ROUNDING NOTE   Subjective:  Patient seen and evaluated at bedside.  Noted to be a bit hypotensive today. Has been administered midodrine as well as albumin during dialysis treatment. Pressors being considered.  Objective:  Vital signs in last 24 hours:  Temperature 97.2 pulse 89 respirations 24 blood pressure 99/57 during dialysis   Physical Exam: General:  No acute distress  Head:  Normocephalic, atraumatic. Moist oral mucosal membranes  Eyes:  Anicteric  Neck:  Supple  Lungs:   Clear to auscultation, normal effort  Heart:  S1S2 no rubs  Abdomen:   Soft, nontender, bowel sounds present  Extremities:  No lower extremity edema  Neurologic:  Awake, alert, following commands  Skin:  No acute rash  Access:  IJ PermCath in place    Basic Metabolic Panel: Recent Labs  Lab 10/26/20 0521 10/27/20 0416 10/29/20 0544 10/31/20 0631  NA 131* 135 135 132*  K 4.3 3.8 4.8 4.0  CL 93* 99 99 96*  CO2 25 26 22 25   GLUCOSE 238* 211* 228* 125*  BUN 73* 46* 104* 88*  CREATININE 3.93* 2.88* 4.35* 4.00*  CALCIUM 9.0 8.8* 8.7* 8.5*  PHOS 2.4*  --  4.8* 4.7*    Liver Function Tests: Recent Labs  Lab 10/26/20 0521 10/27/20 0416 10/29/20 0544 10/31/20 0631  AST  --  86*  --   --   ALT  --  114*  --   --   ALKPHOS  --  272*  --   --   BILITOT  --  3.3*  --   --   PROT  --  7.3  --   --   ALBUMIN 2.4* 2.4* 2.6* 2.6*   No results for input(s): LIPASE, AMYLASE in the last 168 hours. Recent Labs  Lab 10/31/20 0950  AMMONIA 75*    CBC: Recent Labs  Lab 10/26/20 0521 10/27/20 0416 10/28/20 0334 10/29/20 0720 10/31/20 0631  WBC 20.6* 22.7* 20.0* 18.0* 20.4*  HGB 7.9* 7.8* 8.0* 7.8* 7.9*  HCT 26.1* 26.1* 26.3* 26.9* 28.1*  MCV 86.7 90.3 91.0 96.8 99.3  PLT 137* 197 256 274 281    Cardiac Enzymes: No results for input(s): CKTOTAL, CKMB, CKMBINDEX, TROPONINI in the last 168 hours.  BNP: Invalid input(s): POCBNP  CBG: No results for  input(s): GLUCAP in the last 168 hours.  Microbiology: Results for orders placed or performed during the hospital encounter of 10/16/20  Culture, blood (routine x 2)     Status: None (Preliminary result)   Collection Time: 10/31/20  8:54 AM   Specimen: BLOOD LEFT HAND  Result Value Ref Range Status   Specimen Description BLOOD LEFT HAND  Final   Special Requests   Final    BOTTLES DRAWN AEROBIC AND ANAEROBIC Blood Culture results may not be optimal due to an inadequate volume of blood received in culture bottles   Culture   Final    NO GROWTH <12 HOURS Performed at [facility], [place] [address], [place], [place] [address]    Report Status PENDING  Incomplete    Coagulation Studies: No results for input(s): LABPROT, INR in the last 72 hours.  Urinalysis: No results for input(s): COLORURINE, LABSPEC, PHURINE, GLUCOSEU, HGBUR, BILIRUBINUR, KETONESUR, PROTEINUR, UROBILINOGEN, NITRITE, LEUKOCYTESUR in the last 72 hours.  Invalid input(s): APPERANCEUR    Imaging: DG CHEST PORT 1 VIEW  Result Date: 10/31/2020 CLINICAL DATA:  60 year old female with recent anasarca, ascites, right pleural effusion. Pneumonia/pulmonary edema. EXAM: PORTABLE  CHEST 1 VIEW COMPARISON:  CT Abdomen and Pelvis 10/25/2020. Portable chest 10/23/2020 and earlier. FINDINGS: Portable AP semi upright view at 0621 hours. Stable visible enteric tube. Stable right chest dual lumen dialysis type catheter. Continued low lung volumes. Stable mild cardiomegaly. Other mediastinal contours are within normal limits. No pneumothorax. Increased pulmonary interstitial opacity and indistinctness of vasculature from prior chest radiographs. No consolidation. Small pleural effusions have only been evident on CT. Paucity of bowel gas in the upper abdomen. No acute osseous abnormality identified. IMPRESSION: 1. Stable lines and tubes. 2. Increased pulmonary interstitial opacity, favor acute pulmonary edema. Small pleural effusions have  only been evident recently on CT. Electronically Signed   By: Genevie Ann M.D.   On: 10/31/2020 06:34     Medications:       Assessment/ Plan:  60 y.o. 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   1.  ESRD on HD.  Patient noted to be a bit hypotensive today.  She has already received midodrine and albumin during dialysis treatment.  May need to add on pressors during dialysis treatment as well.  2.  Anemia of chronic kidney disease.   Lab Results  Component Value Date   HGB 7.9 (L) 10/31/2020   Maintain the patient on Retacrit 10,000 units IV with dialysis.  3.  Secondary hyperparathyroidism.  Phosphorus close to target at 4.7.  Continue to monitor.  4.  Thrombocytopenia.  Platelets currently 281,000 and much improved as compared to earlier in the admission.  5.  Hyponatremia.  Serum sodium 132 today.  Should partially correct with dialysis treatment.   LOS: 0 Munsoor Lateef 3/9/202211:45 AM

## 2020-11-01 ENCOUNTER — Other Ambulatory Visit (HOSPITAL_COMMUNITY): Payer: Medicare Other

## 2020-11-01 NOTE — Progress Notes (Signed)
PROGRESS NOTE    Theresa Russell  SWN:[id number] DOB: 11-Jan-1961 DOA: 10/16/2020   Brief Narrative: Theresa Russell is an 60 y.o. female with multiple medical problems including ESRD on dialysis, chronic hypoxemic respiratory failure secondary to COPD, hypertension, hyperlipidemia, coronary disease, diabetes mellitus who apparently was admitted to [facility] when she was brought in by EMS with altered mental status, generalized weakness.  In the emergency room she was found to be tachycardic with heart rate in the 140s and atrial flutter.  Blood pressure was stable.  She was noted to have cellulitis, necrotic wounds to bilateral lower extremities. CT was done and it showed findings concerning for left ischial rectal fossa necrotizing fasciitis.  General surgery was consulted.  They initially recommended transfer to tertiary care center.  She was started on broad-spectrum antimicrobials.  However, transfer was on unsuccessful.  Patient was taken to surgery and underwent incision and drainage of the left perineal and ischio rectal abscess measuring about 5 x 5 x 10 cm.  She subsequently developed septic shock requiring vasopressors.  She was given aggressive hydration and broad-spectrum antimicrobial coverage.  Her wound cultures grew Morganella, E. coli, MRSA.  She was initially treated with IV Zosyn, clindamycin. Nephrology was consulted and patient was receiving hemodialysis.  She also had leukocytosis which improved initially but then again there was an upward trend on 10/11/2020.  She underwent repeat imaging of her pelvis which showed concern for necrotizing fasciitis involving the left thigh extending into the groin.  General surgery was consulted again and patient underwent bedside debridement.  After the procedure her WBC count improved.  Patient failed swallow study therefore placed on tube feeding.  Due to her complex medical problems she was transferred and admitted to [facility] on 10/16/2020.    Here she was treated with IV cefepime, Zyvox.  However, she had worsening thrombocytopenia with drop in Platelet count as low as 29.  Therefore recommended to switch to doxycycline.  She completed treatment with the cefepime.  However, she had worsening leukocytosis and developed left groin/thigh cellulitis.  CT of the abdomen and pelvis without contrast was done on 10/25/2020 per report 12.9 cm mature cystic teratoma within the left ovary.  Also reported was anasarca with extensive subcutaneous body wall edema, mild ascites, small right pleural effusion.  For her thrombocytopenia she was given steroids with some improvement in the platelet count. Due to her worsening cellulitis left thigh ultrasound was done which per report showed complex collection 4 cm dissecting through the subcutaneous fat likely abscess/phlegmon. -She remains on treatment with IV meropenem.  Per the primary team patient has been hypotensive during dialysis and was given midodrine and pressors.  She is having worsening leukocytosis.   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  Likely secondary to volume overload/pulmonary edema/pleural effusion, COPD with acute exacerbation.  She is currently on oxygen by nasal cannula.  She has been on treatment with Zyvox, cefepime.   However, had worsening thrombocytopenia.  Therefore recommended to discontinue the Zyvox and switch to doxycycline  and she completed treatment.  She completed treatment with cefepime.   Due to the left thigh abscess/phlegmon started on meropenem. She also has dysphagia and high risk for aspiration and aspiration pneumonia.  Continue to monitor closely.  Left ischio rectal fossa necrotizing fasciitis status post debridement/perirectal abscess: She had debridement done and she also had drainage of the perirectal abscess.  She had cultures that showed Morganella morganii, E. coli, MRSA at the outside facility.  She received treatment with IV cefepime, Zyvox.  Unfortunately had worsening thrombocytopenia.  Therefore recommended to DC the Zyvox and switch to doxycycline. She completed treatment with doxycycline and cefepime.  However, she had left thigh abscess/phlegmon therefore started on meropenem.  Continue local wound care.  If wound worsening consider consulting surgery to evaluate.  Left thigh abscess/phlegmon: Left thigh area indurated with tenderness.  Ultrasound showed probable abscess/phlegmon.  On physical examination it appears to be very indurated, its not fluctuant therefore most likely phlegmon.  Suggested to start her on meropenem.  We may need to extend the duration of meropenem to 2 weeks pending improvement.  She received treatment with doxycycline for MRSA coverage.  Suggest warm compresses to liquefy the phlegmon.  Once it becomes fluctuant suggest consulting surgery for incision and drainage.  Please send for cultures if drained.  Please monitor BUN/creatinine while on antibiotics.  Antibiotics renally dosed.  Severe sepsis with septic shock, currently shock resolved: In the setting of necrotizing fasciitis/perirectal abscess.  She required pressors at the outside facility. Antibiotics restarted as mentioned above.  Per primary team she has been hypotensive with dialysis and required  midodrine and pressors during dialysis. She is high risk for recurrent sepsis. Continue to monitor closely.  Leukocytosis: She is having worsening leukocytosis likely from the thigh abscess/phlegmon.  She is also on steroids which could be contributing to the WBC count. However, WBC count worsening despite steroid taper.  Antibiotics as mentioned above.  Continue to monitor. -If WBC count continues to worsen or if she starts having fevers greater than 101 would recommend to send for repeat pancultures.  Bilateral lower extremity wounds: Continue local wound care.    Thrombocytopenia: Exact etiology for the thrombocytopenia is unclear. HIT panel was negative per the primary team.    She received treatment with steroids.  Currently platelet count improved.  Suggest to taper the steroids in the setting of the left thigh abscess/phlegmon.  End-stage renal disease on hemodialysis: Dialysis per nephrology team.  Medications renally dosed.  Diabetes mellitus type 2: Continue to monitor Accu-Cheks, medications and management of diabetes per the primary team.  She will need proper glycemic control in order to enable wound healing.  COPD: Continue to monitor, management per the primary team.  Left ovarian cystic teratoma: She is high risk for torsion.  Further management per primary team.  Dysphagia/protein calorie malnutrition: Due to her dysphagia she is very high risk for aspiration and recurrent aspiration pneumonia despite being on antibiotics.  Encephalopathy: Likely toxic/metabolic.  Continue supportive management per the primary team.  Chronic systolic congestive heart failure with EF around 40%: Continue medication and management per the primary team.  She was also noted to have pulmonary hypertension on the echocardiogram.  Atrial flutter: Cardiology consulted by the primary team.  She is on amiodarone, digoxin.  Cardizem and metoprolol dose decreased due to hypotension.  Further  management per the primary team and cardiology.  Unfortunately due to her complex medical problems she is very high risk for worsening and decompensation.  Plan of care discussed with the primary team and pharmacy.  Subjective: She is continuing to have leukocytosis.  Left thigh area induration with tenderness, increased warmth.  Objective: Vitals: Temperature 97.8, pulse 71, respiratory rate 18, blood pressure 99/57, oxygen saturation 99%  Examination: Constitutional: Chronically ill-appearing female, opening eyes Head: Atraumatic, normocephalic Eyes: PERLA, EOMI  ENMT: external ears and nose appear normal, normal hearing, Lips appears normal, moist oral mucosa Neck:  supple CVS: S1-S2  Respiratory: Decreased breath sounds lower lobes, rhonchi, rales, no wheezing Abdomen: Obese, nontender,normal bowel sounds, lower abdominal wall edema Musculoskeletal: Bilateral lower extremity wounds with dressing in place, left thigh area induration, increased warmth, tenderness.   Neuro: Severe debility with generalized weakness Psych: Encephalopathic Skin: Bilateral lower extremity wounds, left ischial rectal area wound status post surgical debridement    Data Reviewed: I have personally reviewed following labs and imaging studies  CBC: Recent Labs  Lab 10/26/20 0521 10/27/20 0416 10/28/20 0334 10/29/20 0720 10/31/20 0631  WBC 20.6* 22.7* 20.0* 18.0* 20.4*  HGB 7.9* 7.8* 8.0* 7.8* 7.9*  HCT 26.1* 26.1* 26.3* 26.9* 28.1*  MCV 86.7 90.3 91.0 96.8 99.3  PLT 137* 197 256 274 283    Basic Metabolic Panel: Recent Labs  Lab 10/26/20 0521 10/27/20 0416 10/29/20 0544 10/31/20 0631  NA 131* 135 135 132*  K 4.3 3.8 4.8 4.0  CL 93* 99 99 96*  CO2 25 26 22 25   GLUCOSE 238* 211* 228* 125*  BUN 73* 46* 104* 88*  CREATININE 3.93* 2.88* 4.35* 4.00*  CALCIUM 9.0 8.8* 8.7* 8.5*  PHOS 2.4*  --  4.8* 4.7*    GFR: CrCl cannot be calculated (Unknown ideal weight.).  Liver Function  Tests: Recent Labs  Lab 10/26/20 0521 10/27/20 0416 10/29/20 0544 10/31/20 0631  AST  --  86*  --   --   ALT  --  114*  --   --   ALKPHOS  --  272*  --   --   BILITOT  --  3.3*  --   --   PROT  --  7.3  --   --   ALBUMIN 2.4* 2.4* 2.6* 2.6*    CBG: No results for input(s): GLUCAP in the last 168 hours.   Recent Results (from the past 240 hour(s))  Culture, blood (routine x 2)     Status: None (Preliminary result)   Collection Time: 10/31/20  8:54 AM   Specimen: BLOOD LEFT HAND  Result Value Ref Range Status   Specimen Description BLOOD LEFT HAND  Final   Special Requests   Final    BOTTLES DRAWN AEROBIC AND ANAEROBIC Blood Culture results may not be optimal due to an inadequate volume of blood received in culture bottles   Culture   Final    NO GROWTH 1 DAY Performed at [facility], [place] [address] [place], [place] [address]    Report Status PENDING  Incomplete  Culture, blood (routine x 2)     Status: None (Preliminary result)   Collection Time: 10/31/20  9:48 AM   Specimen: BLOOD LEFT HAND  Result Value Ref Range Status   Specimen Description BLOOD LEFT HAND  Final   Special Requests   Final    BOTTLES DRAWN AEROBIC ONLY Blood Culture adequate volume   Culture   Final    NO GROWTH 1 DAY Performed at [facility], [place] [address] [place], [place] [address]    Report Status PENDING  Incomplete  Scheduled Meds: Please see MAR   Yaakov Guthrie, MD   11/01/2020, 5:42 PM

## 2020-11-02 LAB — CBC
HCT: 26.4 % — ABNORMAL LOW (ref 36.0–46.0)
Hemoglobin: 7.8 g/dL — ABNORMAL LOW (ref 12.0–15.0)
MCH: 29.4 pg (ref 26.0–34.0)
MCHC: 29.5 g/dL — ABNORMAL LOW (ref 30.0–36.0)
MCV: 99.6 fL (ref 80.0–100.0)
Platelets: 241 10*3/uL (ref 150–400)
RBC: 2.65 MIL/uL — ABNORMAL LOW (ref 3.87–5.11)
RDW: 31.6 % — ABNORMAL HIGH (ref 11.5–15.5)
WBC: 15.4 10*3/uL — ABNORMAL HIGH (ref 4.0–10.5)
nRBC: 48.2 % — ABNORMAL HIGH (ref 0.0–0.2)

## 2020-11-02 LAB — RENAL FUNCTION PANEL
Albumin: 2.8 g/dL — ABNORMAL LOW (ref 3.5–5.0)
Anion gap: 10 (ref 5–15)
BUN: 72 mg/dL — ABNORMAL HIGH (ref 6–20)
CO2: 27 mmol/L (ref 22–32)
Calcium: 8.5 mg/dL — ABNORMAL LOW (ref 8.9–10.3)
Chloride: 98 mmol/L (ref 98–111)
Creatinine, Ser: 3.76 mg/dL — ABNORMAL HIGH (ref 0.44–1.00)
GFR, Estimated: 13 mL/min — ABNORMAL LOW (ref >60)
Glucose, Bld: 176 mg/dL — ABNORMAL HIGH (ref 70–99)
Phosphorus: 3.7 mg/dL (ref 2.5–4.6)
Potassium: 3.5 mmol/L (ref 3.5–5.1)
Sodium: 135 mmol/L (ref 135–145)

## 2020-11-02 LAB — HEPATIC FUNCTION PANEL
ALT: 74 U/L — ABNORMAL HIGH (ref 0–44)
AST: 39 U/L (ref 15–41)
Albumin: 2.8 g/dL — ABNORMAL LOW (ref 3.5–5.0)
Alkaline Phosphatase: 293 U/L — ABNORMAL HIGH (ref 38–126)
Bilirubin, Direct: 1.3 mg/dL — ABNORMAL HIGH (ref 0.0–0.2)
Indirect Bilirubin: 1.3 mg/dL — ABNORMAL HIGH (ref 0.3–0.9)
Total Bilirubin: 2.6 mg/dL — ABNORMAL HIGH (ref 0.3–1.2)
Total Protein: 6.3 g/dL — ABNORMAL LOW (ref 6.5–8.1)

## 2020-11-02 LAB — AMMONIA: Ammonia: 17 umol/L (ref 9–35)

## 2020-11-02 NOTE — Progress Notes (Signed)
[facility]  ROUNDING NOTE   Subjective:   Patient due for hemodialysis treatment today. Currently resting comfortably in bed. Relative hypotension persist.  Objective:  Vital signs in last 24 hours:  Temperature 97.6 pulse 73 respirations 23 blood pressure 101/59   Physical Exam: General:  No acute distress  Head:  Normocephalic, atraumatic. Moist oral mucosal membranes  Eyes:  Anicteric  Neck:  Supple  Lungs:   Clear to auscultation, normal effort  Heart:  S1S2 no rubs  Abdomen:   Soft, nontender, bowel sounds present  Extremities:  No lower extremity edema  Neurologic:  Lethargic but arousable  Skin:  No acute rash  Access:  IJ PermCath in place    Basic Metabolic Panel: Recent Labs  Lab 10/27/20 0416 10/29/20 0544 10/31/20 0631 11/02/20 0547  NA 135 135 132* 135  K 3.8 4.8 4.0 3.5  CL 99 99 96* 98  CO2 26 22 25 27   GLUCOSE 211* 228* 125* 176*  BUN 46* 104* 88* 72*  CREATININE 2.88* 4.35* 4.00* 3.76*  CALCIUM 8.8* 8.7* 8.5* 8.5*  PHOS  --  4.8* 4.7* 3.7    Liver Function Tests: Recent Labs  Lab 10/27/20 0416 10/29/20 0544 10/31/20 0631 11/02/20 0547  AST 86*  --   --  39  ALT 114*  --   --  74*  ALKPHOS 272*  --   --  293*  BILITOT 3.3*  --   --  2.6*  PROT 7.3  --   --  6.3*  ALBUMIN 2.4* 2.6* 2.6* 2.8*  2.8*   No results for input(s): LIPASE, AMYLASE in the last 168 hours. Recent Labs  Lab 10/31/20 0950 11/02/20 0547  AMMONIA 75* 17    CBC: Recent Labs  Lab 10/27/20 0416 10/28/20 0334 10/29/20 0720 10/31/20 0631 11/02/20 0547  WBC 22.7* 20.0* 18.0* 20.4* 15.4*  HGB 7.8* 8.0* 7.8* 7.9* 7.8*  HCT 26.1* 26.3* 26.9* 28.1* 26.4*  MCV 90.3 91.0 96.8 99.3 99.6  PLT 197 256 274 281 241    Cardiac Enzymes: No results for input(s): CKTOTAL, CKMB, CKMBINDEX, TROPONINI in the last 168 hours.  BNP: Invalid input(s): POCBNP  CBG: No results for input(s): GLUCAP in the last 168 hours.  Microbiology: Results for orders  placed or performed during the hospital encounter of 10/16/20  Culture, blood (routine x 2)     Status: None (Preliminary result)   Collection Time: 10/31/20  8:54 AM   Specimen: BLOOD LEFT HAND  Result Value Ref Range Status   Specimen Description BLOOD LEFT HAND  Final   Special Requests   Final    BOTTLES DRAWN AEROBIC AND ANAEROBIC Blood Culture results may not be optimal due to an inadequate volume of blood received in culture bottles   Culture   Final    NO GROWTH 1 DAY Performed at [facility], [place] [address], [place], [place] [address]    Report Status PENDING  Incomplete  Culture, blood (routine x 2)     Status: None (Preliminary result)   Collection Time: 10/31/20  9:48 AM   Specimen: BLOOD LEFT HAND  Result Value Ref Range Status   Specimen Description BLOOD LEFT HAND  Final   Special Requests   Final    BOTTLES DRAWN AEROBIC ONLY Blood Culture adequate volume   Culture   Final    NO GROWTH 1 DAY Performed at [facility], [place] [address] [place], [place] [address]    Report Status  PENDING  Incomplete    Coagulation Studies: No results for input(s): LABPROT, INR in the last 72 hours.  Urinalysis: No results for input(s): COLORURINE, LABSPEC, PHURINE, GLUCOSEU, HGBUR, BILIRUBINUR, KETONESUR, PROTEINUR, UROBILINOGEN, NITRITE, LEUKOCYTESUR in the last 72 hours.  Invalid input(s): APPERANCEUR    Imaging: No results found.   Medications:       Assessment/ Plan:  60 y.o. 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   1.  ESRD on HD.  During the last dialysis treatment she was a bit hypotensive.  We  will need to monitor blood pressure closely during treatment again today.  2.  Anemia of chronic kidney disease.   Lab Results  Component Value Date   HGB 7.8 (L) 11/02/2020   Hemoglobin still low at 7.8.  Patient on Retacrit 10,000 IV with dialysis.  3.  Secondary hyperparathyroidism.  Phosphorus 3.7 and acceptable.  Continue to periodically monitor.  4.  Thrombocytopenia.  Platelets improved.  Currently 241,000.  5.  Hyponatremia.  Serum sodium improved up to 135.  Continue to monitor.   LOS: 0 Autymn Omlor 3/11/20228:15 AM

## 2020-11-05 LAB — RENAL FUNCTION PANEL
Albumin: 2.8 g/dL — ABNORMAL LOW (ref 3.5–5.0)
Anion gap: 9 (ref 5–15)
BUN: 78 mg/dL — ABNORMAL HIGH (ref 6–20)
CO2: 27 mmol/L (ref 22–32)
Calcium: 8.7 mg/dL — ABNORMAL LOW (ref 8.9–10.3)
Chloride: 97 mmol/L — ABNORMAL LOW (ref 98–111)
Creatinine, Ser: 3.95 mg/dL — ABNORMAL HIGH (ref 0.44–1.00)
GFR, Estimated: 13 mL/min — ABNORMAL LOW (ref >60)
Glucose, Bld: 144 mg/dL — ABNORMAL HIGH (ref 70–99)
Phosphorus: 2.8 mg/dL (ref 2.5–4.6)
Potassium: 3.6 mmol/L (ref 3.5–5.1)
Sodium: 133 mmol/L — ABNORMAL LOW (ref 135–145)

## 2020-11-05 LAB — CULTURE, BLOOD (ROUTINE X 2)
Culture: NO GROWTH
Culture: NO GROWTH
Special Requests: ADEQUATE

## 2020-11-05 LAB — CBC
HCT: 25.2 % — ABNORMAL LOW (ref 36.0–46.0)
Hemoglobin: 7.6 g/dL — ABNORMAL LOW (ref 12.0–15.0)
MCH: 29.6 pg (ref 26.0–34.0)
MCHC: 30.2 g/dL (ref 30.0–36.0)
MCV: 98.1 fL (ref 80.0–100.0)
Platelets: 199 10*3/uL (ref 150–400)
RBC: 2.57 MIL/uL — ABNORMAL LOW (ref 3.87–5.11)
RDW: 33.6 % — ABNORMAL HIGH (ref 11.5–15.5)
WBC: 11.7 10*3/uL — ABNORMAL HIGH (ref 4.0–10.5)
nRBC: 13.4 % — ABNORMAL HIGH (ref 0.0–0.2)

## 2020-11-05 NOTE — Progress Notes (Signed)
[facility]  ROUNDING NOTE   Subjective:   Patient seen and evaluated during hemodialysis treatment today. Tolerating well. Currently awake and alert. Seems to be in better spirits today.  Objective:  Vital signs in last 24 hours:  Temperature 96.4 pulse 83 respirations 14 blood pressure 114/57   Physical Exam: General:  No acute distress  Head:  Normocephalic, atraumatic. Moist oral mucosal membranes  Eyes:  Anicteric  Neck:  Supple  Lungs:   Clear to auscultation, normal effort  Heart:  S1S2 no rubs  Abdomen:   Soft, nontender, bowel sounds present  Extremities:  No lower extremity edema  Neurologic:  Awake, alert, conversant  Skin:  No acute rash  Access:  IJ PermCath in place    Basic Metabolic Panel: Recent Labs  Lab 10/31/20 0631 11/02/20 0547 11/05/20 0533  NA 132* 135 133*  K 4.0 3.5 3.6  CL 96* 98 97*  CO2 25 27 27   GLUCOSE 125* 176* 144*  BUN 88* 72* 78*  CREATININE 4.00* 3.76* 3.95*  CALCIUM 8.5* 8.5* 8.7*  PHOS 4.7* 3.7 2.8    Liver Function Tests: Recent Labs  Lab 10/31/20 0631 11/02/20 0547 11/05/20 0533  AST  --  39  --   ALT  --  74*  --   ALKPHOS  --  293*  --   BILITOT  --  2.6*  --   PROT  --  6.3*  --   ALBUMIN 2.6* 2.8*  2.8* 2.8*   No results for input(s): LIPASE, AMYLASE in the last 168 hours. Recent Labs  Lab 10/31/20 0950 11/02/20 0547  AMMONIA 75* 17    CBC: Recent Labs  Lab 10/29/20 0720 10/31/20 0631 11/02/20 0547 11/05/20 0533  WBC 18.0* 20.4* 15.4* 11.7*  HGB 7.8* 7.9* 7.8* 7.6*  HCT 26.9* 28.1* 26.4* 25.2*  MCV 96.8 99.3 99.6 98.1  PLT 274 281 241 199    Cardiac Enzymes: No results for input(s): CKTOTAL, CKMB, CKMBINDEX, TROPONINI in the last 168 hours.  BNP: Invalid input(s): POCBNP  CBG: No results for input(s): GLUCAP in the last 168 hours.  Microbiology: Results for orders placed or performed during the hospital encounter of 10/16/20  Culture, blood (routine x 2)     Status:  None (Preliminary result)   Collection Time: 10/31/20  8:54 AM   Specimen: BLOOD LEFT HAND  Result Value Ref Range Status   Specimen Description BLOOD LEFT HAND  Final   Special Requests   Final    BOTTLES DRAWN AEROBIC AND ANAEROBIC Blood Culture results may not be optimal due to an inadequate volume of blood received in culture bottles   Culture   Final    NO GROWTH 4 DAYS Performed at [facility], [place] [address], [place], [place] [address]    Report Status PENDING  Incomplete  Culture, blood (routine x 2)     Status: None (Preliminary result)   Collection Time: 10/31/20  9:48 AM   Specimen: BLOOD LEFT HAND  Result Value Ref Range Status   Specimen Description BLOOD LEFT HAND  Final   Special Requests   Final    BOTTLES DRAWN AEROBIC ONLY Blood Culture adequate volume   Culture   Final    NO GROWTH 4 DAYS Performed at [facility], [place] [address], [place], [place] [address]    Report Status PENDING  Incomplete    Coagulation Studies: No results for input(s): LABPROT, INR in the last 72 hours.  Urinalysis:  No results for input(s): COLORURINE, LABSPEC, [place], GLUCOSEU, HGBUR, BILIRUBINUR, KETONESUR, PROTEINUR, UROBILINOGEN, NITRITE, LEUKOCYTESUR in the last 72 hours.  Invalid input(s): APPERANCEUR    Imaging: No results found.   Medications:       Assessment/ Plan:  60 y.o. 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   1.  ESRD on HD.  Patient seen and evaluated during dialysis treatment.  Tolerating well.  2.  Anemia of chronic kidney disease.   Lab Results  Component Value Date   HGB 7.6 (L)  11/05/2020   Continue Retacrit 10,000 IV with dialysis treatments.  3.  Secondary hyperparathyroidism.  Phosphorus 2.8 and acceptable today.  4.  Thrombocytopenia.  Overall improved as compared to admission.  Platelets currently 199,000.  5.  Hyponatremia.  Serum sodium slightly low today 133 but should improve with dialysis treatment.   LOS: 0 Jermal Dismuke 3/14/20227:59 AM

## 2020-11-07 LAB — CBC
HCT: 25.2 % — ABNORMAL LOW (ref 36.0–46.0)
Hemoglobin: 7.7 g/dL — ABNORMAL LOW (ref 12.0–15.0)
MCH: 30.6 pg (ref 26.0–34.0)
MCHC: 30.6 g/dL (ref 30.0–36.0)
MCV: 100 fL (ref 80.0–100.0)
Platelets: 146 10*3/uL — ABNORMAL LOW (ref 150–400)
RBC: 2.52 MIL/uL — ABNORMAL LOW (ref 3.87–5.11)
WBC: 11 10*3/uL — ABNORMAL HIGH (ref 4.0–10.5)
nRBC: 9.7 % — ABNORMAL HIGH (ref 0.0–0.2)

## 2020-11-07 LAB — RENAL FUNCTION PANEL
Albumin: 2.8 g/dL — ABNORMAL LOW (ref 3.5–5.0)
Anion gap: 11 (ref 5–15)
BUN: 73 mg/dL — ABNORMAL HIGH (ref 6–20)
CO2: 25 mmol/L (ref 22–32)
Calcium: 8.5 mg/dL — ABNORMAL LOW (ref 8.9–10.3)
Chloride: 95 mmol/L — ABNORMAL LOW (ref 98–111)
Creatinine, Ser: 3.82 mg/dL — ABNORMAL HIGH (ref 0.44–1.00)
GFR, Estimated: 13 mL/min — ABNORMAL LOW (ref >60)
Glucose, Bld: 137 mg/dL — ABNORMAL HIGH (ref 70–99)
Phosphorus: 2.6 mg/dL (ref 2.5–4.6)
Potassium: 3.7 mmol/L (ref 3.5–5.1)
Sodium: 131 mmol/L — ABNORMAL LOW (ref 135–145)

## 2020-11-08 ENCOUNTER — Other Ambulatory Visit (HOSPITAL_COMMUNITY): Payer: Medicare Other

## 2020-11-09 LAB — CBC
HCT: 26.3 % — ABNORMAL LOW (ref 36.0–46.0)
Hemoglobin: 7.9 g/dL — ABNORMAL LOW (ref 12.0–15.0)
MCH: 30.5 pg (ref 26.0–34.0)
MCHC: 30 g/dL (ref 30.0–36.0)
MCV: 101.5 fL — ABNORMAL HIGH (ref 80.0–100.0)
Platelets: 127 10*3/uL — ABNORMAL LOW (ref 150–400)
RBC: 2.59 MIL/uL — ABNORMAL LOW (ref 3.87–5.11)
WBC: 10.1 10*3/uL (ref 4.0–10.5)
nRBC: 7.4 % — ABNORMAL HIGH (ref 0.0–0.2)

## 2020-11-09 LAB — RENAL FUNCTION PANEL
Albumin: 3 g/dL — ABNORMAL LOW (ref 3.5–5.0)
Anion gap: 10 (ref 5–15)
BUN: 68 mg/dL — ABNORMAL HIGH (ref 6–20)
CO2: 26 mmol/L (ref 22–32)
Calcium: 8.7 mg/dL — ABNORMAL LOW (ref 8.9–10.3)
Chloride: 97 mmol/L — ABNORMAL LOW (ref 98–111)
Creatinine, Ser: 3.57 mg/dL — ABNORMAL HIGH (ref 0.44–1.00)
GFR, Estimated: 14 mL/min — ABNORMAL LOW (ref >60)
Glucose, Bld: 88 mg/dL (ref 70–99)
Phosphorus: 2.6 mg/dL (ref 2.5–4.6)
Potassium: 4.1 mmol/L (ref 3.5–5.1)
Sodium: 133 mmol/L — ABNORMAL LOW (ref 135–145)

## 2020-11-09 NOTE — Progress Notes (Signed)
[facility]  ROUNDING NOTE   Subjective:   Patient in good spirits today. Awake and alert. Due for hemodialysis treatment today.  Objective:  Vital signs in last 24 hours:  Temperature 98.2 pulse 70 respirations 23 blood pressure 131/75.   Physical Exam: General:  No acute distress  Head:  Normocephalic, atraumatic. Moist oral mucosal membranes  Eyes:  Anicteric  Neck:  Supple  Lungs:   Clear to auscultation, normal effort  Heart:  S1S2 no rubs  Abdomen:   Soft, nontender, bowel sounds present  Extremities:  No lower extremity edema  Neurologic:  Awake, alert, conversant  Skin:  No acute rash  Access:  IJ PermCath in place    Basic Metabolic Panel: Recent Labs  Lab 11/05/20 0533 11/07/20 0513 11/09/20 0536  NA 133* 131* 133*  K 3.6 3.7 4.1  CL 97* 95* 97*  CO2 27 25 26   GLUCOSE 144* 137* 88  BUN 78* 73* 68*  CREATININE 3.95* 3.82* 3.57*  CALCIUM 8.7* 8.5* 8.7*  PHOS 2.8 2.6 2.6    Liver Function Tests: Recent Labs  Lab 11/05/20 0533 11/07/20 0513 11/09/20 0536  ALBUMIN 2.8* 2.8* 3.0*   No results for input(s): LIPASE, AMYLASE in the last 168 hours. No results for input(s): AMMONIA in the last 168 hours.  CBC: Recent Labs  Lab 11/05/20 0533 11/07/20 0513 11/09/20 0633  WBC 11.7* 11.0* 10.1  HGB 7.6* 7.7* 7.9*  HCT 25.2* 25.2* 26.3*  MCV 98.1 100.0 101.5*  PLT 199 146* 127*    Cardiac Enzymes: No results for input(s): CKTOTAL, CKMB, CKMBINDEX, TROPONINI in the last 168 hours.  BNP: Invalid input(s): POCBNP  CBG: No results for input(s): GLUCAP in the last 168 hours.  Microbiology: Results for orders placed or performed during the hospital encounter of 10/16/20  Culture, blood (routine x 2)     Status: None   Collection Time: 10/31/20  8:54 AM   Specimen: BLOOD LEFT HAND  Result Value Ref Range Status   Specimen Description BLOOD LEFT HAND  Final   Special Requests   Final    BOTTLES DRAWN AEROBIC AND ANAEROBIC Blood  Culture results may not be optimal due to an inadequate volume of blood received in culture bottles   Culture   Final    NO GROWTH 5 DAYS Performed at [facility], [place] [address], [place], [place] [address]    Report Status 11/05/2020 FINAL  Final  Culture, blood (routine x 2)     Status: None   Collection Time: 10/31/20  9:48 AM   Specimen: BLOOD LEFT HAND  Result Value Ref Range Status   Specimen Description BLOOD LEFT HAND  Final   Special Requests   Final    BOTTLES DRAWN AEROBIC ONLY Blood Culture adequate volume   Culture   Final    NO GROWTH 5 DAYS Performed at [facility], [place] [address], [place], [place] [address]    Report Status 11/05/2020 FINAL  Final    Coagulation Studies: No results for input(s): LABPROT, INR in the last 72 hours.  Urinalysis: No results for input(s): COLORURINE, LABSPEC, PHURINE, GLUCOSEU, HGBUR, BILIRUBINUR, KETONESUR, PROTEINUR, UROBILINOGEN, NITRITE, LEUKOCYTESUR in the last 72 hours.  Invalid input(s): APPERANCEUR    Imaging: No results found.   Medications:       Assessment/ Plan:  60 y.o. female with a PMHx of ESRD with a right IJ PermCath in place, left ischio rectal fossa necrotizing fasciitis status post surgical debridement,  sepsis, metabolic encephalopathy, acute respiratory failure, anemia of chronic kidney disease, atrial flutter with RVR, diabetes mellitus type 2, dysphagia, chronic systolic heart failure, who was admitted to [facility] on 10/16/2020 for ongoing care.   1.  ESRD on HD.  Patient due for hemodialysis treatment today.  Orders have been prepared.  2.  Anemia of chronic kidney disease.   Lab Results  Component Value Date   HGB 7.9 (L) 11/09/2020   Maintain the patient on Retacrit 10,000 IV with dialysis.  Monitor CBC.  3.  Secondary hyperparathyroidism.  Phosphorus 2.6 at last check and within target range.  4.  Thrombocytopenia.  Platelets recently fluctuating.  Currently  127,000.  5.  Hyponatremia.  Sodium up to 133 at last check.  Continue to periodically monitor.   LOS: 0 Crisanto Nied 3/18/20228:48 AM

## 2020-11-11 NOTE — Progress Notes (Signed)
PROGRESS NOTE    Theresa Russell  FYB:[id number] DOB: August 21, 1961 DOA: 10/16/2020   Brief Narrative: Theresa Russell is an 60 y.o. female with multiple medical problems including ESRD on dialysis, chronic hypoxemic respiratory failure secondary to COPD, hypertension, hyperlipidemia, coronary disease, diabetes mellitus who apparently was admitted to [facility] when she was brought in by EMS with altered mental status, generalized weakness.  In the emergency room she was found to be tachycardic with heart rate in the 140s and atrial flutter.  Blood pressure was stable.  She was noted to have cellulitis, necrotic wounds to bilateral lower extremities. CT was done and it showed findings concerning for left ischial rectal fossa necrotizing fasciitis.  General surgery was consulted.  They initially recommended transfer to tertiary care center.  She was started on broad-spectrum antimicrobials.  However, transfer was on unsuccessful.  Patient was taken to surgery and underwent incision and drainage of the left perineal and ischio rectal abscess measuring about 5 x 5 x 10 cm.  She subsequently developed septic shock requiring vasopressors.  She was given aggressive hydration and broad-spectrum antimicrobial coverage.  Her wound cultures grew Morganella, E. coli, MRSA.  She was initially treated with IV Zosyn, clindamycin. Nephrology was consulted and patient was receiving hemodialysis.  She also had leukocytosis which improved initially but then again there was an upward trend on 10/11/2020.  She underwent repeat imaging of her pelvis which showed concern for necrotizing fasciitis involving the left thigh extending into the groin.  General surgery was consulted again and patient underwent bedside debridement.  After the procedure her WBC count improved.  Patient failed swallow study therefore placed on tube feeding.  Due to her complex medical problems she was transferred and admitted to [facility] on 10/16/2020. Here she was treated with IV cefepime, Zyvox.  However, she had worsening thrombocytopenia with drop in Platelet count as low as 29.  Therefore recommended to switch to doxycycline.  She completed treatment with the cefepime.  However, she had worsening leukocytosis and developed left groin/thigh cellulitis.  CT of the abdomen and pelvis without contrast was done on 10/25/2020 per report 12.9 cm mature cystic teratoma within the left ovary.  Also reported was anasarca with extensive subcutaneous body wall edema, mild ascites, small right pleural effusion.  For her thrombocytopenia she was given steroids with some improvement in the platelet count. Due to her worsening cellulitis left thigh ultrasound was done which per report showed complex collection 4 cm dissecting through the subcutaneous fat likely abscess/phlegmon. -She underwent drainage of the left inner thigh abscess on 11/08/2020.  Received treatment with IV meropenem.    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  Likely secondary to volume overload/pulmonary edema/pleural effusion, COPD with acute exacerbation. She has been on treatment with Zyvox, cefepime.  However, had worsening thrombocytopenia.  Therefore recommended to discontinue the Zyvox and switch to doxycycline and she  completed treatment.  She completed treatment with cefepime.   Due to the left thigh abscess/phlegmon started on meropenem. She also has dysphagia  and high risk for aspiration and aspiration pneumonia.  Continue to monitor closely.  If her respiratory status worsens suggest repeat chest imaging preferably chest CT which can be done without contrast due to her ESRD.  Left ischio rectal fossa necrotizing fasciitis status post debridement/perirectal abscess: She had debridement done and she also had drainage of the perirectal abscess.  She had cultures that showed Morganella morganii, E. coli, MRSA at the outside facility.  She received treatment with IV cefepime, Zyvox.  Unfortunately had worsening thrombocytopenia.  Therefore recommended to DC the Zyvox and switch to doxycycline. She completed treatment with doxycycline and cefepime.  However, she had left thigh abscess/phlegmon therefore started on meropenem.  Continue local wound care.  If wound worsening consider consulting surgery to evaluate.  Left thigh abscess/phlegmon: Left thigh area was indurated with tenderness.  Ultrasound showed probable abscess/phlegmon.  Therefore suggested treatment with meropenem.  Suggested warm compresses to liquefy the phlegmon.  She had a drainage of the left inner thigh abscess on 11/08/2020.  Continue to monitor.  Severe sepsis with septic shock, currently shock resolved: In the setting of necrotizing fasciitis/perirectal abscess.  She required pressors at the outside facility. Antibiotics restarted as mentioned above.  Per primary team she has been hypotensive with dialysis and required midodrine and pressors during dialysis. She is high risk for recurrent sepsis. Continue to monitor closely.  Leukocytosis: She had worsening leukocytosis likely from the thigh abscess/phlegmon.  She was also on steroids which could have contributed to the WBC count. Now that the left inner thigh abscess was drained her WBC count is  improving.  Bilateral lower extremity wounds: Continue local wound care.    Thrombocytopenia: Exact etiology for the thrombocytopenia is unclear. HIT panel was negative per the primary team. She received treatment with steroids.  Currently platelet count improved.  Continue to monitor.  End-stage renal disease on hemodialysis: Dialysis per nephrology team.  Medications renally dosed.  Diabetes mellitus type 2: Continue to monitor Accu-Cheks, medications and management of diabetes per the primary team.  She will need proper glycemic control in order to enable wound healing.  COPD: Continue to monitor, management per the primary team.  Left ovarian cystic teratoma: She is high risk for torsion.  Further management per primary team.  Dysphagia/protein calorie malnutrition: Due to her dysphagia she is very high risk for aspiration and recurrent aspiration pneumonia despite being on antibiotics.  Encephalopathy: Likely toxic/metabolic.  Continue supportive management per the primary team.  Chronic systolic congestive heart failure with EF around 40%: Continue medication and management per the primary team.  She was also noted to have pulmonary hypertension on the echocardiogram.  Atrial flutter: Cardiology consulted by the primary team.  She is on amiodarone, digoxin.  Cardizem and metoprolol dose decreased due to hypotension.  Further management per the primary team and cardiology.  Unfortunately due to her complex medical problems she is very high risk for worsening and decompensation.  Plan of care discussed with the primary team and pharmacy.  Subjective: She had a left inner thigh abscess drained on 11/08/2020.  Mental status improving.  Objective: Vitals: Temperature 98.2, pulse 70, respiratory 20, blood pressure 128/77, oxygen saturation 99%  Examination: Constitutional: Chronically ill-appearing female Head: Atraumatic, normocephalic Eyes: PERLA, EOMI  ENMT: external ears  and nose appear normal, normal hearing, Lips appears normal, moist oral mucosa Neck:  supple CVS: S1-S2  Respiratory: Decreased breath sounds lower lobes, occasional rhonchi, no wheezing Abdomen: Obese, nontender,normal bowel sounds, lower abdominal wall edema  Musculoskeletal: Bilateral lower extremity wounds with dressing in place, left thigh abscess drained. Neuro: Severe debility with generalized weakness Psych: Mental status improving. Skin: Bilateral lower extremity wounds, left ischial rectal area wound status post surgical debridement    Data Reviewed: I have personally reviewed following labs and imaging studies  CBC: Recent Labs  Lab 11/05/20 0533 11/07/20 0513 11/09/20 0633  WBC 11.7* 11.0* 10.1  HGB 7.6* 7.7* 7.9*  HCT 25.2* 25.2* 26.3*  MCV 98.1 100.0 101.5*  PLT 199 146* 127*    Basic Metabolic Panel: Recent Labs  Lab 11/05/20 0533 11/07/20 0513 11/09/20 0536  NA 133* 131* 133*  K 3.6 3.7 4.1  CL 97* 95* 97*  CO2 27 25 26   GLUCOSE 144* 137* 88  BUN 78* 73* 68*  CREATININE 3.95* 3.82* 3.57*  CALCIUM 8.7* 8.5* 8.7*  PHOS 2.8 2.6 2.6    GFR: CrCl cannot be calculated (Unknown ideal weight.).  Liver Function Tests: Recent Labs  Lab 11/05/20 0533 11/07/20 0513 11/09/20 0536  ALBUMIN 2.8* 2.8* 3.0*    CBG: No results for input(s): GLUCAP in the last 168 hours.   No results found for this or any previous visit (from the past 240 hour(s)).   Scheduled Meds: Please see MAR   Yaakov Guthrie, MD   11/11/2020, 7:14 PM

## 2020-11-12 LAB — RENAL FUNCTION PANEL
Albumin: 3 g/dL — ABNORMAL LOW (ref 3.5–5.0)
Anion gap: 13 (ref 5–15)
BUN: 64 mg/dL — ABNORMAL HIGH (ref 6–20)
CO2: 22 mmol/L (ref 22–32)
Calcium: 8.6 mg/dL — ABNORMAL LOW (ref 8.9–10.3)
Chloride: 99 mmol/L (ref 98–111)
Creatinine, Ser: 4.27 mg/dL — ABNORMAL HIGH (ref 0.44–1.00)
GFR, Estimated: 12 mL/min — ABNORMAL LOW (ref >60)
Glucose, Bld: 86 mg/dL (ref 70–99)
Phosphorus: 3.7 mg/dL (ref 2.5–4.6)
Potassium: 4.3 mmol/L (ref 3.5–5.1)
Sodium: 134 mmol/L — ABNORMAL LOW (ref 135–145)

## 2020-11-12 LAB — CBC
HCT: 27.5 % — ABNORMAL LOW (ref 36.0–46.0)
Hemoglobin: 8 g/dL — ABNORMAL LOW (ref 12.0–15.0)
MCH: 30.3 pg (ref 26.0–34.0)
MCHC: 29.1 g/dL — ABNORMAL LOW (ref 30.0–36.0)
MCV: 104.2 fL — ABNORMAL HIGH (ref 80.0–100.0)
Platelets: 120 10*3/uL — ABNORMAL LOW (ref 150–400)
RBC: 2.64 MIL/uL — ABNORMAL LOW (ref 3.87–5.11)
WBC: 8.5 10*3/uL (ref 4.0–10.5)
nRBC: 6.3 % — ABNORMAL HIGH (ref 0.0–0.2)

## 2020-11-12 NOTE — Progress Notes (Signed)
[facility]  ROUNDING NOTE   Subjective:   Patient seen and evaluated during HD. toleratin gwell.  Sitting up in chair at the moment.   Objective:  Vital signs in last 24 hours:  Temperature 98 pulse 88 respirations 18 blood pressure 124/74   Physical Exam: General:  No acute distress  Head:  Normocephalic, atraumatic. Moist oral mucosal membranes  Eyes:  Anicteric  Neck:  Supple  Lungs:   Clear to auscultation, normal effort  Heart:  S1S2 no rubs  Abdomen:   Soft, nontender, bowel sounds present  Extremities:  No lower extremity edema  Neurologic:  Awake, alert, conversant  Skin:  No acute rash  Access:  IJ PermCath in place    Basic Metabolic Panel: Recent Labs  Lab 11/07/20 0513 11/09/20 0536 11/12/20 0535  NA 131* 133* 134*  K 3.7 4.1 4.3  CL 95* 97* 99  CO2 25 26 22   GLUCOSE 137* 88 86  BUN 73* 68* 64*  CREATININE 3.82* 3.57* 4.27*  CALCIUM 8.5* 8.7* 8.6*  PHOS 2.6 2.6 3.7    Liver Function Tests: Recent Labs  Lab 11/07/20 0513 11/09/20 0536 11/12/20 0535  ALBUMIN 2.8* 3.0* 3.0*   No results for input(s): LIPASE, AMYLASE in the last 168 hours. No results for input(s): AMMONIA in the last 168 hours.  CBC: Recent Labs  Lab 11/07/20 0513 11/09/20 0633 11/12/20 0535  WBC 11.0* 10.1 8.5  HGB 7.7* 7.9* 8.0*  HCT 25.2* 26.3* 27.5*  MCV 100.0 101.5* 104.2*  PLT 146* 127* 120*    Cardiac Enzymes: No results for input(s): CKTOTAL, CKMB, CKMBINDEX, TROPONINI in the last 168 hours.  BNP: Invalid input(s): POCBNP  CBG: No results for input(s): GLUCAP in the last 168 hours.  Microbiology: Results for orders placed or performed during the hospital encounter of 10/16/20  Culture, blood (routine x 2)     Status: None   Collection Time: 10/31/20  8:54 AM   Specimen: BLOOD LEFT HAND  Result Value Ref Range Status   Specimen Description BLOOD LEFT HAND  Final   Special Requests   Final    BOTTLES DRAWN AEROBIC AND ANAEROBIC Blood  Culture results may not be optimal due to an inadequate volume of blood received in culture bottles   Culture   Final    NO GROWTH 5 DAYS Performed at [facility], [place] [address] [place], [place] [address]    Report Status 11/05/2020 FINAL  Final  Culture, blood (routine x 2)     Status: None   Collection Time: 10/31/20  9:48 AM   Specimen: BLOOD LEFT HAND  Result Value Ref Range Status   Specimen Description BLOOD LEFT HAND  Final   Special Requests   Final    BOTTLES DRAWN AEROBIC ONLY Blood Culture adequate volume   Culture   Final    NO GROWTH 5 DAYS Performed at [facility], [place] [address], [place], [place] [address]    Report Status 11/05/2020 FINAL  Final    Coagulation Studies: No results for input(s): LABPROT, INR in the last 72 hours.  Urinalysis: No results for input(s): COLORURINE, LABSPEC, PHURINE, GLUCOSEU, HGBUR, BILIRUBINUR, KETONESUR, PROTEINUR, UROBILINOGEN, NITRITE, LEUKOCYTESUR in the last 72 hours.  Invalid input(s): APPERANCEUR    Imaging: No results found.   Medications:       Assessment/ Plan:  60 y.o. female with a PMHx of ESRD with a right IJ PermCath in place, left ischio rectal fossa necrotizing fasciitis  status post surgical debridement, sepsis, metabolic encephalopathy, acute respiratory failure, anemia of chronic kidney disease, atrial flutter with RVR, diabetes mellitus type 2, dysphagia, chronic systolic heart failure, who was admitted to [facility] on 10/16/2020 for ongoing care.   1.  ESRD on HD.  Patient seen and evaluated during HD, tolerating well, complete HD treatment today.   2.  Anemia of chronic kidney disease.   Lab Results  Component Value Date   HGB 8.0 (L) 11/12/2020   Continue retacrit 10000 units IV with HD.   3.  Secondary hyperparathyroidism.  Phosphorus 3.7 and at target.   4.  Thrombocytopenia.  Platelets recently fluctuating.  Currently 120,000.  5.  Hyponatremia.  Sodium 134 this AM,  will monitor.    LOS: 0 Munsoor Lateef 3/21/20228:22 AM

## 2020-11-14 LAB — HEPATITIS B CORE ANTIBODY, IGM: Hep B C IgM: NONREACTIVE

## 2020-11-14 LAB — RENAL FUNCTION PANEL
Albumin: 3.1 g/dL — ABNORMAL LOW (ref 3.5–5.0)
Anion gap: 13 (ref 5–15)
BUN: 52 mg/dL — ABNORMAL HIGH (ref 6–20)
CO2: 21 mmol/L — ABNORMAL LOW (ref 22–32)
Calcium: 8.4 mg/dL — ABNORMAL LOW (ref 8.9–10.3)
Chloride: 101 mmol/L (ref 98–111)
Creatinine, Ser: 4.16 mg/dL — ABNORMAL HIGH (ref 0.44–1.00)
GFR, Estimated: 12 mL/min — ABNORMAL LOW (ref >60)
Glucose, Bld: 97 mg/dL (ref 70–99)
Phosphorus: 4.3 mg/dL (ref 2.5–4.6)
Potassium: 4.6 mmol/L (ref 3.5–5.1)
Sodium: 135 mmol/L (ref 135–145)

## 2020-11-14 LAB — CBC
HCT: 27.6 % — ABNORMAL LOW (ref 36.0–46.0)
Hemoglobin: 8 g/dL — ABNORMAL LOW (ref 12.0–15.0)
MCH: 30.4 pg (ref 26.0–34.0)
MCHC: 29 g/dL — ABNORMAL LOW (ref 30.0–36.0)
MCV: 104.9 fL — ABNORMAL HIGH (ref 80.0–100.0)
Platelets: 117 10*3/uL — ABNORMAL LOW (ref 150–400)
RBC: 2.63 MIL/uL — ABNORMAL LOW (ref 3.87–5.11)
WBC: 6.7 10*3/uL (ref 4.0–10.5)
nRBC: 5.5 % — ABNORMAL HIGH (ref 0.0–0.2)

## 2020-11-14 LAB — HEPATITIS PANEL, ACUTE
HCV Ab: NONREACTIVE
Hep A IgM: NONREACTIVE
Hep B C IgM: NONREACTIVE
Hepatitis B Surface Ag: NONREACTIVE

## 2020-11-14 LAB — HEPATITIS B SURFACE ANTIGEN: Hepatitis B Surface Ag: NONREACTIVE

## 2020-11-14 NOTE — Progress Notes (Signed)
[facility]  ROUNDING NOTE   Subjective:   Patient seen and evaluated during hemodialysis treatment. Tolerating well. In good spirits today.  Objective:  Vital signs in last 24 hours:  Temperature 96.1 pulse 86 respirations 20 blood pressure 149/81   Physical Exam: General:  No acute distress  Head:  Normocephalic, atraumatic. Moist oral mucosal membranes  Eyes:  Anicteric  Neck:  Supple  Lungs:   Clear to auscultation, normal effort  Heart:  S1S2 no rubs  Abdomen:   Soft, nontender, bowel sounds present  Extremities:  No lower extremity edema  Neurologic:  Awake, alert, conversant  Skin:  No acute rash  Access:  IJ PermCath in place    Basic Metabolic Panel: Recent Labs  Lab 11/09/20 0536 11/12/20 0535  NA 133* 134*  K 4.1 4.3  CL 97* 99  CO2 26 22  GLUCOSE 88 86  BUN 68* 64*  CREATININE 3.57* 4.27*  CALCIUM 8.7* 8.6*  PHOS 2.6 3.7    Liver Function Tests: Recent Labs  Lab 11/09/20 0536 11/12/20 0535  ALBUMIN 3.0* 3.0*   No results for input(s): LIPASE, AMYLASE in the last 168 hours. No results for input(s): AMMONIA in the last 168 hours.  CBC: Recent Labs  Lab 11/09/20 0633 11/12/20 0535 11/14/20 0441  WBC 10.1 8.5 6.7  HGB 7.9* 8.0* 8.0*  HCT 26.3* 27.5* 27.6*  MCV 101.5* 104.2* 104.9*  PLT 127* 120* 117*    Cardiac Enzymes: No results for input(s): CKTOTAL, CKMB, CKMBINDEX, TROPONINI in the last 168 hours.  BNP: Invalid input(s): POCBNP  CBG: No results for input(s): GLUCAP in the last 168 hours.  Microbiology: Results for orders placed or performed during the hospital encounter of 10/16/20  Culture, blood (routine x 2)     Status: None   Collection Time: 10/31/20  8:54 AM   Specimen: BLOOD LEFT HAND  Result Value Ref Range Status   Specimen Description BLOOD LEFT HAND  Final   Special Requests   Final    BOTTLES DRAWN AEROBIC AND ANAEROBIC Blood Culture results may not be optimal due to an inadequate volume of  blood received in culture bottles   Culture   Final    NO GROWTH 5 DAYS Performed at [facility], [place] [address] [place], [place] [address]    Report Status 11/05/2020 FINAL  Final  Culture, blood (routine x 2)     Status: None   Collection Time: 10/31/20  9:48 AM   Specimen: BLOOD LEFT HAND  Result Value Ref Range Status   Specimen Description BLOOD LEFT HAND  Final   Special Requests   Final    BOTTLES DRAWN AEROBIC ONLY Blood Culture adequate volume   Culture   Final    NO GROWTH 5 DAYS Performed at [facility], [place] [address], [place], [place] [address]    Report Status 11/05/2020 FINAL  Final    Coagulation Studies: No results for input(s): LABPROT, INR in the last 72 hours.  Urinalysis: No results for input(s): COLORURINE, LABSPEC, PHURINE, GLUCOSEU, HGBUR, BILIRUBINUR, KETONESUR, PROTEINUR, UROBILINOGEN, NITRITE, LEUKOCYTESUR in the last 72 hours.  Invalid input(s): APPERANCEUR    Imaging: No results found.   Medications:       Assessment/ Plan:  60 y.o. female with a PMHx of ESRD with a right IJ PermCath in place, left ischio rectal fossa necrotizing fasciitis status post surgical debridement, sepsis, metabolic encephalopathy, acute respiratory failure, anemia of chronic kidney disease, atrial flutter with  RVR, diabetes mellitus type 2, dysphagia, chronic systolic heart failure, who was admitted to [facility] on 10/16/2020 for ongoing care.   1.  ESRD on HD.  Patient seen and evaluated during hemodialysis treatment today.  Tolerating well.  Ultrafiltration target 2 kg.  2.  Anemia of chronic kidney disease.   Lab Results  Component Value Date   HGB 8.0 (L) 11/14/2020   Hemoglobin 8.0 as above.  Administer Retacrit 10,000 units IV with dialysis session today.  3.  Secondary hyperparathyroidism.  Continue to monitor bone mineral metabolism parameters.  Phosphorus at target.  4.  Thrombocytopenia.  Platelets currently 117,000.  5.   Hyponatremia.  Sodium close to target at 134.  Continue to periodically monitor.   LOS: 0 Theresa Russell 3/23/20228:56 AM

## 2020-11-15 LAB — HEPATITIS B SURFACE ANTIBODY, QUANTITATIVE: Hep B S AB Quant (Post): <3.1 m[IU]/mL — ABNORMAL LOW (ref >9.9)

## 2020-11-15 LAB — HEPATITIS B E ANTIBODY: Hep B E Ab: NEGATIVE

## 2020-11-16 LAB — RENAL FUNCTION PANEL
Albumin: 3.4 g/dL — ABNORMAL LOW (ref 3.5–5.0)
Anion gap: 11 (ref 5–15)
BUN: 43 mg/dL — ABNORMAL HIGH (ref 6–20)
CO2: 26 mmol/L (ref 22–32)
Calcium: 8.6 mg/dL — ABNORMAL LOW (ref 8.9–10.3)
Chloride: 97 mmol/L — ABNORMAL LOW (ref 98–111)
Creatinine, Ser: 3.92 mg/dL — ABNORMAL HIGH (ref 0.44–1.00)
GFR, Estimated: 13 mL/min — ABNORMAL LOW (ref >60)
Glucose, Bld: 97 mg/dL (ref 70–99)
Phosphorus: 4.6 mg/dL (ref 2.5–4.6)
Potassium: 4.7 mmol/L (ref 3.5–5.1)
Sodium: 134 mmol/L — ABNORMAL LOW (ref 135–145)

## 2020-11-16 LAB — CBC
HCT: 29.2 % — ABNORMAL LOW (ref 36.0–46.0)
Hemoglobin: 8.8 g/dL — ABNORMAL LOW (ref 12.0–15.0)
MCH: 31.2 pg (ref 26.0–34.0)
MCHC: 30.1 g/dL (ref 30.0–36.0)
MCV: 103.5 fL — ABNORMAL HIGH (ref 80.0–100.0)
Platelets: 166 10*3/uL (ref 150–400)
RBC: 2.82 MIL/uL — ABNORMAL LOW (ref 3.87–5.11)
WBC: 6.6 10*3/uL (ref 4.0–10.5)
nRBC: 5.2 % — ABNORMAL HIGH (ref 0.0–0.2)

## 2020-11-16 LAB — DIGOXIN LEVEL: Digoxin Level: 3.6 ng/mL (ref 0.8–2.0)

## 2020-11-16 NOTE — Progress Notes (Signed)
[facility]  ROUNDING NOTE   Subjective:   Patient seen and evaluated during hemodialysis treatment today. Blood flow rate 400. Ultrafiltration target 1.5 kg.  Objective:  Vital signs in last 24 hours:  Temperature 96.8 pulse 83 respirations 20 blood pressure 156/95   Physical Exam: General:  No acute distress  Head:  Normocephalic, atraumatic. Moist oral mucosal membranes  Eyes:  Anicteric  Neck:  Supple  Lungs:   Clear to auscultation, normal effort  Heart:  S1S2 no rubs  Abdomen:   Soft, nontender, bowel sounds present  Extremities:  No lower extremity edema  Neurologic:  Awake, alert, conversant  Skin:  No acute rash  Access:  IJ PermCath in place    Basic Metabolic Panel: Recent Labs  Lab 11/12/20 0535 11/14/20 0441 11/16/20 0553  NA 134* 135 134*  K 4.3 4.6 4.7  CL 99 101 97*  CO2 22 21* 26  GLUCOSE 86 97 97  BUN 64* 52* 43*  CREATININE 4.27* 4.16* 3.92*  CALCIUM 8.6* 8.4* 8.6*  PHOS 3.7 4.3 4.6    Liver Function Tests: Recent Labs  Lab 11/12/20 0535 11/14/20 0441 11/16/20 0553  ALBUMIN 3.0* 3.1* 3.4*   No results for input(s): LIPASE, AMYLASE in the last 168 hours. No results for input(s): AMMONIA in the last 168 hours.  CBC: Recent Labs  Lab 11/12/20 0535 11/14/20 0441  WBC 8.5 6.7  HGB 8.0* 8.0*  HCT 27.5* 27.6*  MCV 104.2* 104.9*  PLT 120* 117*    Cardiac Enzymes: No results for input(s): CKTOTAL, CKMB, CKMBINDEX, TROPONINI in the last 168 hours.  BNP: Invalid input(s): POCBNP  CBG: No results for input(s): GLUCAP in the last 168 hours.  Microbiology: Results for orders placed or performed during the hospital encounter of 10/16/20  Culture, blood (routine x 2)     Status: None   Collection Time: 10/31/20  8:54 AM   Specimen: BLOOD LEFT HAND  Result Value Ref Range Status   Specimen Description BLOOD LEFT HAND  Final   Special Requests   Final    BOTTLES DRAWN AEROBIC AND ANAEROBIC Blood Culture results may  not be optimal due to an inadequate volume of blood received in culture bottles   Culture   Final    NO GROWTH 5 DAYS Performed at [facility], [place] [address] [place], [place] [address]    Report Status 11/05/2020 FINAL  Final  Culture, blood (routine x 2)     Status: None   Collection Time: 10/31/20  9:48 AM   Specimen: BLOOD LEFT HAND  Result Value Ref Range Status   Specimen Description BLOOD LEFT HAND  Final   Special Requests   Final    BOTTLES DRAWN AEROBIC ONLY Blood Culture adequate volume   Culture   Final    NO GROWTH 5 DAYS Performed at [facility], [place] [address] [place],  [address]    Report Status 11/05/2020 FINAL  Final    Coagulation Studies: No results for input(s): LABPROT, INR in the last 72 hours.  Urinalysis: No results for input(s): COLORURINE, LABSPEC, PHURINE, GLUCOSEU, HGBUR, BILIRUBINUR, KETONESUR, PROTEINUR, UROBILINOGEN, NITRITE, LEUKOCYTESUR in the last 72 hours.  Invalid input(s): APPERANCEUR    Imaging: No results found.   Medications:       Assessment/ Plan:  60 y.o. female with a PMHx of ESRD with a right IJ PermCath in place, left ischio rectal fossa necrotizing fasciitis status post surgical debridement, sepsis, metabolic encephalopathy, acute  respiratory failure, anemia of chronic kidney disease, atrial flutter with RVR, diabetes mellitus type 2, dysphagia, chronic systolic heart failure, who was admitted to [facility] on 10/16/2020 for ongoing care.   1.  ESRD on HD.  Patient seen during dialysis treatment this AM.  Blood flow rate 400 with ultrafiltration target of 1.5 kg.  2.  Anemia of chronic kidney disease.   Lab Results  Component Value Date   HGB 8.0 (L) 11/14/2020   Continue Retacrit 10,000 IV with dialysis treatments.  3.  Secondary hyperparathyroidism.  Phosphorus 4.6 at last check.  Continue to periodically monitor.  4.  Thrombocytopenia.  Platelets currently 117,000.  5.  Hyponatremia.   Serum sodium 134 this AM.   LOS: 0 Pleas Carneal 3/25/20228:15 AM

## 2020-11-18 ENCOUNTER — Other Ambulatory Visit: Payer: Self-pay

## 2020-11-19 LAB — RENAL FUNCTION PANEL
Albumin: 3.3 g/dL — ABNORMAL LOW (ref 3.5–5.0)
Anion gap: 11 (ref 5–15)
BUN: 48 mg/dL — ABNORMAL HIGH (ref 6–20)
CO2: 25 mmol/L (ref 22–32)
Calcium: 8.4 mg/dL — ABNORMAL LOW (ref 8.9–10.3)
Chloride: 101 mmol/L (ref 98–111)
Creatinine, Ser: 5.14 mg/dL — ABNORMAL HIGH (ref 0.44–1.00)
GFR, Estimated: 9 mL/min — ABNORMAL LOW (ref >60)
Glucose, Bld: 137 mg/dL — ABNORMAL HIGH (ref 70–99)
Phosphorus: 4.8 mg/dL — ABNORMAL HIGH (ref 2.5–4.6)
Potassium: 5 mmol/L (ref 3.5–5.1)
Sodium: 137 mmol/L (ref 135–145)

## 2020-11-19 LAB — CBC
HCT: 30 % — ABNORMAL LOW (ref 36.0–46.0)
Hemoglobin: 8.4 g/dL — ABNORMAL LOW (ref 12.0–15.0)
MCH: 30.3 pg (ref 26.0–34.0)
MCHC: 28 g/dL — ABNORMAL LOW (ref 30.0–36.0)
MCV: 108.3 fL — ABNORMAL HIGH (ref 80.0–100.0)
Platelets: 181 10*3/uL (ref 150–400)
RBC: 2.77 MIL/uL — ABNORMAL LOW (ref 3.87–5.11)
RDW: 25.9 % — ABNORMAL HIGH (ref 11.5–15.5)
WBC: 7.2 10*3/uL (ref 4.0–10.5)
nRBC: 4.6 % — ABNORMAL HIGH (ref 0.0–0.2)

## 2020-11-19 NOTE — Progress Notes (Signed)
[facility]  ROUNDING NOTE   Subjective:   Patient seen during dialysis treatment this AM. She is currently tolerating well. Blood flow rate currently 400. Ultrafiltration target 2 kg today.  Objective:  Vital signs in last 24 hours:  Temperature 98 pulse 66 respirations 17 blood pressure 147/83   Physical Exam: General:  No acute distress  Head:  Normocephalic, atraumatic. Moist oral mucosal membranes  Eyes:  Anicteric  Neck:  Supple  Lungs:   Clear to auscultation, normal effort  Heart:  S1S2 no rubs  Abdomen:   Soft, nontender, bowel sounds present  Extremities:  No lower extremity edema  Neurologic:  Awake, alert, conversant  Skin:  No acute rash  Access:  IJ PermCath in place    Basic Metabolic Panel: Recent Labs  Lab 11/14/20 0441 11/16/20 0553 11/19/20 0640  NA 135 134* 137  K 4.6 4.7 5.0  CL 101 97* 101  CO2 21* 26 25  GLUCOSE 97 97 137*  BUN 52* 43* 48*  CREATININE 4.16* 3.92* 5.14*  CALCIUM 8.4* 8.6* 8.4*  PHOS 4.3 4.6 4.8*    Liver Function Tests: Recent Labs  Lab 11/14/20 0441 11/16/20 0553 11/19/20 0640  ALBUMIN 3.1* 3.4* 3.3*   No results for input(s): LIPASE, AMYLASE in the last 168 hours. No results for input(s): AMMONIA in the last 168 hours.  CBC: Recent Labs  Lab 11/14/20 0441 11/16/20 0553 11/19/20 0640  WBC 6.7 6.6 PENDING  HGB 8.0* 8.8* 8.4*  HCT 27.6* 29.2* 30.0*  MCV 104.9* 103.5* 108.3*  PLT 117* 166 181    Cardiac Enzymes: No results for input(s): CKTOTAL, CKMB, CKMBINDEX, TROPONINI in the last 168 hours.  BNP: Invalid input(s): POCBNP  CBG: No results for input(s): GLUCAP in the last 168 hours.  Microbiology: Results for orders placed or performed during the hospital encounter of 10/16/20  Culture, blood (routine x 2)     Status: None   Collection Time: 10/31/20  8:54 AM   Specimen: BLOOD LEFT HAND  Result Value Ref Range Status   Specimen Description BLOOD LEFT HAND  Final   Special  Requests   Final    BOTTLES DRAWN AEROBIC AND ANAEROBIC Blood Culture results may not be optimal due to an inadequate volume of blood received in culture bottles   Culture   Final    NO GROWTH 5 DAYS Performed at [facility], [place] [address] [place], [place] [address]    Report Status 11/05/2020 FINAL  Final  Culture, blood (routine x 2)     Status: None   Collection Time: 10/31/20  9:48 AM   Specimen: BLOOD LEFT HAND  Result Value Ref Range Status   Specimen Description BLOOD LEFT HAND  Final   Special Requests   Final    BOTTLES DRAWN AEROBIC ONLY Blood Culture adequate volume   Culture   Final    NO GROWTH 5 DAYS Performed at [facility], [place] [address], [place], [place] [address]    Report Status 11/05/2020 FINAL  Final    Coagulation Studies: No results for input(s): LABPROT, INR in the last 72 hours.  Urinalysis: No results for input(s): COLORURINE, LABSPEC, PHURINE, GLUCOSEU, HGBUR, BILIRUBINUR, KETONESUR, PROTEINUR, UROBILINOGEN, NITRITE, LEUKOCYTESUR in the last 72 hours.  Invalid input(s): APPERANCEUR    Imaging: No results found.   Medications:       Assessment/ Plan:  60 y.o. 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   1.  ESRD on HD.  Patient seen and evaluated during dialysis treatment today.  Tolerating well.  Blood flow rate 400 with ultrafiltration target of 2 kg..  2.  Anemia of chronic kidney disease.   Lab Results  Component Value Date   HGB 8.4 (L) 11/19/2020   Maintain the patient on Retacrit 10,000 units IV with dialysis today.  3.  Secondary hyperparathyroidism.  Phosphorus  currently 4.8 and at target.  4.  Thrombocytopenia.  Platelets now normalized at 181,000.  5.  Hyponatremia.  Serum sodium now at target range at 137.   LOS: 0 Rajeev Escue 3/28/20228:04 AM

## 2020-11-20 LAB — BLOOD GAS, ARTERIAL
Acid-base deficit: 0 mmol/L (ref 0.0–2.0)
Bicarbonate: 26 mmol/L (ref 20.0–28.0)
FIO2: 40
O2 Saturation: 92.8 %
Patient temperature: 36.4
pCO2 arterial: 56.6 mmHg — ABNORMAL HIGH (ref 32.0–48.0)
pH, Arterial: 7.281 — ABNORMAL LOW (ref 7.350–7.450)
pO2, Arterial: 67.6 mmHg — ABNORMAL LOW (ref 83.0–108.0)

## 2020-11-21 LAB — RENAL FUNCTION PANEL
Albumin: 2.9 g/dL — ABNORMAL LOW (ref 3.5–5.0)
Anion gap: 11 (ref 5–15)
BUN: 44 mg/dL — ABNORMAL HIGH (ref 6–20)
CO2: 26 mmol/L (ref 22–32)
Calcium: 8.2 mg/dL — ABNORMAL LOW (ref 8.9–10.3)
Chloride: 99 mmol/L (ref 98–111)
Creatinine, Ser: 4.56 mg/dL — ABNORMAL HIGH (ref 0.44–1.00)
GFR, Estimated: 11 mL/min — ABNORMAL LOW (ref >60)
Glucose, Bld: 80 mg/dL (ref 70–99)
Phosphorus: 3.2 mg/dL (ref 2.5–4.6)
Potassium: 4.6 mmol/L (ref 3.5–5.1)
Sodium: 136 mmol/L (ref 135–145)

## 2020-11-21 LAB — CBC
HCT: 27.3 % — ABNORMAL LOW (ref 36.0–46.0)
Hemoglobin: 8 g/dL — ABNORMAL LOW (ref 12.0–15.0)
MCH: 31.3 pg (ref 26.0–34.0)
MCHC: 29.3 g/dL — ABNORMAL LOW (ref 30.0–36.0)
MCV: 106.6 fL — ABNORMAL HIGH (ref 80.0–100.0)
Platelets: 141 10*3/uL — ABNORMAL LOW (ref 150–400)
RBC: 2.56 MIL/uL — ABNORMAL LOW (ref 3.87–5.11)
RDW: 24.7 % — ABNORMAL HIGH (ref 11.5–15.5)
WBC: 4.1 10*3/uL (ref 4.0–10.5)
nRBC: 3.9 % — ABNORMAL HIGH (ref 0.0–0.2)

## 2020-11-21 NOTE — Progress Notes (Signed)
[facility]  ROUNDING NOTE   Subjective:   Patient seen and evaluated during dialysis treatment. Much more confused today. Appears to be talking to herself.  Objective:  Vital signs in last 24 hours:  Temperature 98.8 pulse 70 respirations 20 blood pressure 148/67   Physical Exam: General:  No acute distress  Head:  Normocephalic, atraumatic. Moist oral mucosal membranes  Eyes:  Anicteric  Neck:  Supple  Lungs:   Clear to auscultation, normal effort  Heart:  S1S2 no rubs  Abdomen:   Soft, nontender, bowel sounds present  Extremities:  No lower extremity edema  Neurologic:  Awake, alert, confused but does follow commands.  Skin:  No acute rash  Access:  IJ PermCath in place    Basic Metabolic Panel: Recent Labs  Lab 11/16/20 0553 11/19/20 0640 11/21/20 0721  NA 134* 137 136  K 4.7 5.0 4.6  CL 97* 101 99  CO2 26 25 26   GLUCOSE 97 137* 80  BUN 43* 48* 44*  CREATININE 3.92* 5.14* 4.56*  CALCIUM 8.6* 8.4* 8.2*  PHOS 4.6 4.8* 3.2    Liver Function Tests: Recent Labs  Lab 11/16/20 0553 11/19/20 0640 11/21/20 0721  ALBUMIN 3.4* 3.3* 2.9*   No results for input(s): LIPASE, AMYLASE in the last 168 hours. No results for input(s): AMMONIA in the last 168 hours.  CBC: Recent Labs  Lab 11/16/20 0553 11/19/20 0640 11/21/20 0703  WBC 6.6 7.2 4.1  HGB 8.8* 8.4* 8.0*  HCT 29.2* 30.0* 27.3*  MCV 103.5* 108.3* 106.6*  PLT 166 181 141*    Cardiac Enzymes: No results for input(s): CKTOTAL, CKMB, CKMBINDEX, TROPONINI in the last 168 hours.  BNP: Invalid input(s): POCBNP  CBG: No results for input(s): GLUCAP in the last 168 hours.  Microbiology: Results for orders placed or performed during the hospital encounter of 10/16/20  Culture, blood (routine x 2)     Status: None   Collection Time: 10/31/20  8:54 AM   Specimen: BLOOD LEFT HAND  Result Value Ref Range Status   Specimen Description BLOOD LEFT HAND  Final   Special Requests   Final     BOTTLES DRAWN AEROBIC AND ANAEROBIC Blood Culture results may not be optimal due to an inadequate volume of blood received in culture bottles   Culture   Final    NO GROWTH 5 DAYS Performed at [facility], [place] [address], [place], [place] [address]    Report Status 11/05/2020 FINAL  Final  Culture, blood (routine x 2)     Status: None   Collection Time: 10/31/20  9:48 AM   Specimen: BLOOD LEFT HAND  Result Value Ref Range Status   Specimen Description BLOOD LEFT HAND  Final   Special Requests   Final    BOTTLES DRAWN AEROBIC ONLY Blood Culture adequate volume   Culture   Final    NO GROWTH 5 DAYS Performed at [facility], [place] [address] [place], [place] [address]    Report Status 11/05/2020 FINAL  Final    Coagulation Studies: No results for input(s): LABPROT, INR in the last 72 hours.  Urinalysis: No results for input(s): COLORURINE, LABSPEC, PHURINE, GLUCOSEU, HGBUR, BILIRUBINUR, KETONESUR, PROTEINUR, UROBILINOGEN, NITRITE, LEUKOCYTESUR in the last 72 hours.  Invalid input(s): APPERANCEUR    Imaging: No results found.   Medications:       Assessment/ Plan:  60 y.o. female with a PMHx of ESRD with a right IJ PermCath in place, left ischio  rectal fossa necrotizing fasciitis status post surgical debridement, sepsis, metabolic encephalopathy, acute respiratory failure, anemia of chronic kidney disease, atrial flutter with RVR, diabetes mellitus type 2, dysphagia, chronic systolic heart failure, who was admitted to [facility] on 10/16/2020 for ongoing care.   1.  ESRD on HD.  Patient seen during dialysis treatment.  Tolerating well.  We plan to complete dialysis treatment today.  2.  Anemia of chronic kidney disease.   Lab Results  Component Value Date   HGB 8.0 (L) 11/21/2020   Continue Retacrit 10,000 units IV with dialysis treatments.  3.  Secondary hyperparathyroidism.  Phosphorus at target at 3.2.  4.  Thrombocytopenia.  Platelets down a bit  today to 141,000.  These have been fluctuating.  5.  Hyponatremia.  Serum sodium normalized to 136.  6.  Altered mental status.  Could be due to psychotropic medications.  Pain medications, benzodiazepines, and gabapentin to be held.  Discussed with hospitalist.   LOS: 0 Theresa Russell 3/30/202210:19 AM

## 2020-11-23 LAB — CBC
HCT: 29.6 % — ABNORMAL LOW (ref 36.0–46.0)
Hemoglobin: 8.7 g/dL — ABNORMAL LOW (ref 12.0–15.0)
MCH: 31 pg (ref 26.0–34.0)
MCHC: 29.4 g/dL — ABNORMAL LOW (ref 30.0–36.0)
MCV: 105.3 fL — ABNORMAL HIGH (ref 80.0–100.0)
Platelets: 150 10*3/uL (ref 150–400)
RBC: 2.81 MIL/uL — ABNORMAL LOW (ref 3.87–5.11)
RDW: 22.8 % — ABNORMAL HIGH (ref 11.5–15.5)
WBC: 7.4 10*3/uL (ref 4.0–10.5)
nRBC: 2.2 % — ABNORMAL HIGH (ref 0.0–0.2)

## 2020-11-23 LAB — RENAL FUNCTION PANEL
Albumin: 3 g/dL — ABNORMAL LOW (ref 3.5–5.0)
Anion gap: 10 (ref 5–15)
BUN: 44 mg/dL — ABNORMAL HIGH (ref 6–20)
CO2: 27 mmol/L (ref 22–32)
Calcium: 8.5 mg/dL — ABNORMAL LOW (ref 8.9–10.3)
Chloride: 100 mmol/L (ref 98–111)
Creatinine, Ser: 4.18 mg/dL — ABNORMAL HIGH (ref 0.44–1.00)
GFR, Estimated: 12 mL/min — ABNORMAL LOW (ref >60)
Glucose, Bld: 90 mg/dL (ref 70–99)
Phosphorus: 4.5 mg/dL (ref 2.5–4.6)
Potassium: 5.1 mmol/L (ref 3.5–5.1)
Sodium: 137 mmol/L (ref 135–145)

## 2020-11-23 LAB — AMMONIA: Ammonia: 55 umol/L — ABNORMAL HIGH (ref 9–35)

## 2020-11-23 NOTE — Progress Notes (Signed)
[facility]  ROUNDING NOTE   Subjective:   Patient seen and evaluated during hemodialysis treatment today. Tolerating well. Ultrafiltration target 1.5 kg.  Objective:  Vital signs in last 24 hours:  Temperature 97.6 pulse 49 respirations 16 blood pressure 108/73   Physical Exam: General:  No acute distress  Head:  Normocephalic, atraumatic. Moist oral mucosal membranes  Eyes:  Anicteric  Neck:  Supple  Lungs:   Clear to auscultation, normal effort  Heart:  S1S2 no rubs  Abdomen:   Soft, nontender, bowel sounds present  Extremities:  No lower extremity edema  Neurologic:  Awake, alert, confused but does follow commands.  Skin:  No acute rash  Access:  IJ PermCath in place    Basic Metabolic Panel: Recent Labs  Lab 11/19/20 0640 11/21/20 0721 11/23/20 0557  NA 137 136 137  K 5.0 4.6 5.1  CL 101 99 100  CO2 25 26 27   GLUCOSE 137* 80 90  BUN 48* 44* 44*  CREATININE 5.14* 4.56* 4.18*  CALCIUM 8.4* 8.2* 8.5*  PHOS 4.8* 3.2 4.5    Liver Function Tests: Recent Labs  Lab 11/19/20 0640 11/21/20 0721 11/23/20 0557  ALBUMIN 3.3* 2.9* 3.0*   No results for input(s): LIPASE, AMYLASE in the last 168 hours. Recent Labs  Lab 11/23/20 0557  AMMONIA 55*    CBC: Recent Labs  Lab 11/19/20 0640 11/21/20 0703 11/23/20 0557  WBC 7.2 4.1 7.4  HGB 8.4* 8.0* 8.7*  HCT 30.0* 27.3* 29.6*  MCV 108.3* 106.6* 105.3*  PLT 181 141* 150    Cardiac Enzymes: No results for input(s): CKTOTAL, CKMB, CKMBINDEX, TROPONINI in the last 168 hours.  BNP: Invalid input(s): POCBNP  CBG: No results for input(s): GLUCAP in the last 168 hours.  Microbiology: Results for orders placed or performed during the hospital encounter of 10/16/20  Culture, blood (routine x 2)     Status: None   Collection Time: 10/31/20  8:54 AM   Specimen: BLOOD LEFT HAND  Result Value Ref Range Status   Specimen Description BLOOD LEFT HAND  Final   Special Requests   Final    BOTTLES  DRAWN AEROBIC AND ANAEROBIC Blood Culture results may not be optimal due to an inadequate volume of blood received in culture bottles   Culture   Final    NO GROWTH 5 DAYS Performed at [facility], [place] [address], [place], [place] [address]    Report Status 11/05/2020 FINAL  Final  Culture, blood (routine x 2)     Status: None   Collection Time: 10/31/20  9:48 AM   Specimen: BLOOD LEFT HAND  Result Value Ref Range Status   Specimen Description BLOOD LEFT HAND  Final   Special Requests   Final    BOTTLES DRAWN AEROBIC ONLY Blood Culture adequate volume   Culture   Final    NO GROWTH 5 DAYS Performed at [facility], [place] [address] [place], [place] [address]    Report Status 11/05/2020 FINAL  Final    Coagulation Studies: No results for input(s): LABPROT, INR in the last 72 hours.  Urinalysis: No results for input(s): COLORURINE, LABSPEC, PHURINE, GLUCOSEU, HGBUR, BILIRUBINUR, KETONESUR, PROTEINUR, UROBILINOGEN, NITRITE, LEUKOCYTESUR in the last 72 hours.  Invalid input(s): APPERANCEUR    Imaging: No results found.   Medications:       Assessment/ Plan:  60 y.o. female with a PMHx of ESRD with a right IJ PermCath in place, left ischio rectal fossa  necrotizing fasciitis status post surgical debridement, sepsis, metabolic encephalopathy, acute respiratory failure, anemia of chronic kidney disease, atrial flutter with RVR, diabetes mellitus type 2, dysphagia, chronic systolic heart failure, who was admitted to [facility] on 10/16/2020 for ongoing care.   1.  ESRD on HD.  Patient seen during dialysis treatment today with ultrafiltration target of 1.5 kg.  We will complete dialysis treatment today and next treatment will be on Monday.  2.  Anemia of chronic kidney disease.   Lab Results  Component Value Date   HGB 8.7 (L) 11/23/2020   Maintain the patient on Retacrit 10,000 units with dialysis on MWF  3.  Secondary hyperparathyroidism.  Phosphorus 4.5  and at target.  4.  Thrombocytopenia.  Platelets now up to 150,000.  Continue to monitor.  5.  Hyponatremia.  Serum sodium now at target at 137.  Continue periodically monitor.  6.  Altered mental status.  Could be due to psychotropic medications.  Pain medications, benzodiazepines, and gabapentin remain on hold.     LOS: 0 Sheleen Conchas 4/1/20228:26 AM

## 2020-11-26 ENCOUNTER — Other Ambulatory Visit (HOSPITAL_COMMUNITY): Payer: Medicare Other

## 2020-11-26 LAB — CBC
HCT: 30 % — ABNORMAL LOW (ref 36.0–46.0)
Hemoglobin: 8.7 g/dL — ABNORMAL LOW (ref 12.0–15.0)
MCH: 29.7 pg (ref 26.0–34.0)
MCHC: 29 g/dL — ABNORMAL LOW (ref 30.0–36.0)
MCV: 102.4 fL — ABNORMAL HIGH (ref 80.0–100.0)
Platelets: 150 10*3/uL (ref 150–400)
RBC: 2.93 MIL/uL — ABNORMAL LOW (ref 3.87–5.11)
RDW: 21.4 % — ABNORMAL HIGH (ref 11.5–15.5)
WBC: 17.5 10*3/uL — ABNORMAL HIGH (ref 4.0–10.5)
nRBC: 1.2 % — ABNORMAL HIGH (ref 0.0–0.2)

## 2020-11-26 LAB — RENAL FUNCTION PANEL
Albumin: 2.9 g/dL — ABNORMAL LOW (ref 3.5–5.0)
Anion gap: 14 (ref 5–15)
BUN: 59 mg/dL — ABNORMAL HIGH (ref 6–20)
CO2: 21 mmol/L — ABNORMAL LOW (ref 22–32)
Calcium: 8.7 mg/dL — ABNORMAL LOW (ref 8.9–10.3)
Chloride: 103 mmol/L (ref 98–111)
Creatinine, Ser: 4.67 mg/dL — ABNORMAL HIGH (ref 0.44–1.00)
GFR, Estimated: 10 mL/min — ABNORMAL LOW (ref >60)
Glucose, Bld: 80 mg/dL (ref 70–99)
Phosphorus: 1.9 mg/dL — ABNORMAL LOW (ref 2.5–4.6)
Potassium: 5 mmol/L (ref 3.5–5.1)
Sodium: 138 mmol/L (ref 135–145)

## 2020-11-26 NOTE — Progress Notes (Signed)
[facility]  ROUNDING NOTE   Subjective:   Patient in pleasant spirits today. Does report some pain this AM. Due for hemodialysis treatment today.  Objective:  Vital signs in last 24 hours:  Temperature 101.3 pulse 88 respirations 43 blood pressure 149/56   Physical Exam: General:  No acute distress  Head:  Normocephalic, atraumatic. Moist oral mucosal membranes  Eyes:  Anicteric  Neck:  Supple  Lungs:   Clear to auscultation, normal effort  Heart:  S1S2 no rubs  Abdomen:   Soft, nontender, bowel sounds present  Extremities:  No lower extremity edema  Neurologic:  Awake, alert, conversant.  Skin:  No acute rash  Access:  IJ PermCath in place    Basic Metabolic Panel: Recent Labs  Lab 11/21/20 0721 11/23/20 0557 11/26/20 0559  NA 136 137 138  K 4.6 5.1 5.0  CL 99 100 103  CO2 26 27 21*  GLUCOSE 80 90 80  BUN 44* 44* 59*  CREATININE 4.56* 4.18* 4.67*  CALCIUM 8.2* 8.5* 8.7*  PHOS 3.2 4.5 1.9*    Liver Function Tests: Recent Labs  Lab 11/21/20 0721 11/23/20 0557 11/26/20 0559  ALBUMIN 2.9* 3.0* 2.9*   No results for input(s): LIPASE, AMYLASE in the last 168 hours. Recent Labs  Lab 11/23/20 0557  AMMONIA 55*    CBC: Recent Labs  Lab 11/21/20 0703 11/23/20 0557 11/26/20 0559  WBC 4.1 7.4 17.5*  HGB 8.0* 8.7* 8.7*  HCT 27.3* 29.6* 30.0*  MCV 106.6* 105.3* 102.4*  PLT 141* 150 150    Cardiac Enzymes: No results for input(s): CKTOTAL, CKMB, CKMBINDEX, TROPONINI in the last 168 hours.  BNP: Invalid input(s): POCBNP  CBG: No results for input(s): GLUCAP in the last 168 hours.  Microbiology: Results for orders placed or performed during the hospital encounter of 10/16/20  Culture, blood (routine x 2)     Status: None   Collection Time: 10/31/20  8:54 AM   Specimen: BLOOD LEFT HAND  Result Value Ref Range Status   Specimen Description BLOOD LEFT HAND  Final   Special Requests   Final    BOTTLES DRAWN AEROBIC AND ANAEROBIC  Blood Culture results may not be optimal due to an inadequate volume of blood received in culture bottles   Culture   Final    NO GROWTH 5 DAYS Performed at [facility], [place] [address] [place], [place] [address]    Report Status 11/05/2020 FINAL  Final  Culture, blood (routine x 2)     Status: None   Collection Time: 10/31/20  9:48 AM   Specimen: BLOOD LEFT HAND  Result Value Ref Range Status   Specimen Description BLOOD LEFT HAND  Final   Special Requests   Final    BOTTLES DRAWN AEROBIC ONLY Blood Culture adequate volume   Culture   Final    NO GROWTH 5 DAYS Performed at [facility], [place] [address], [place], [place] [address]    Report Status 11/05/2020 FINAL  Final    Coagulation Studies: No results for input(s): LABPROT, INR in the last 72 hours.  Urinalysis: No results for input(s): COLORURINE, LABSPEC, PHURINE, GLUCOSEU, HGBUR, BILIRUBINUR, KETONESUR, PROTEINUR, UROBILINOGEN, NITRITE, LEUKOCYTESUR in the last 72 hours.  Invalid input(s): APPERANCEUR    Imaging: No results found.   Medications:       Assessment/ Plan:  60 y.o. female with a PMHx of ESRD with a right IJ PermCath in place, left ischio rectal fossa necrotizing fasciitis  status post surgical debridement, sepsis, metabolic encephalopathy, acute respiratory failure, anemia of chronic kidney disease, atrial flutter with RVR, diabetes mellitus type 2, dysphagia, chronic systolic heart failure, who was admitted to [facility] on 10/16/2020 for ongoing care.   1.  ESRD on HD.  Patient seen and evaluated at bedside.  Due for hemodialysis treatment later today.  2.  Anemia of chronic kidney disease.   Lab Results  Component Value Date   HGB 8.7 (L) 11/26/2020   Administer Retacrit 10,000 IV with dialysis today.  3.  Secondary hyperparathyroidism.  Continue to monitor bone mineral metabolism parameters.  4.  Thrombocytopenia.  Platelets stable at 150,000 this AM.  5.  Hyponatremia.   Appears to be resolved.  Serum sodium 138.  6.  Leukocytosis/fever.  Noted to be febrile today as well as having leukocytosis.  WBC count 17.5.  Check blood cultures x2 sets and we will go ahead and dose the patient on vancomycin while awaiting blood cultures.   LOS: 0 Theresa Russell 4/4/20228:03 AM

## 2020-11-27 ENCOUNTER — Other Ambulatory Visit (HOSPITAL_COMMUNITY): Payer: Medicare Other

## 2020-11-27 HISTORY — PX: IR REMOVAL TUN CV CATH W/O FL: IMG2289

## 2020-11-27 LAB — BLOOD CULTURE ID PANEL (REFLEXED) - BCID2

## 2020-11-27 LAB — CBC
HCT: 27.9 % — ABNORMAL LOW (ref 36.0–46.0)
Hemoglobin: 8.4 g/dL — ABNORMAL LOW (ref 12.0–15.0)
MCH: 29.8 pg (ref 26.0–34.0)
MCHC: 30.1 g/dL (ref 30.0–36.0)
MCV: 98.9 fL (ref 80.0–100.0)
Platelets: 150 10*3/uL (ref 150–400)
RBC: 2.82 MIL/uL — ABNORMAL LOW (ref 3.87–5.11)
RDW: 21.2 % — ABNORMAL HIGH (ref 11.5–15.5)
WBC: 27.6 10*3/uL — ABNORMAL HIGH (ref 4.0–10.5)
nRBC: 0.4 % — ABNORMAL HIGH (ref 0.0–0.2)

## 2020-11-27 LAB — EXPECTORATED SPUTUM ASSESSMENT W GRAM STAIN, RFLX TO RESP C

## 2020-11-27 LAB — LACTIC ACID, PLASMA: Lactic Acid, Venous: 1.8 mmol/L (ref 0.5–1.9)

## 2020-11-27 LAB — AMMONIA: Ammonia: 45 umol/L — ABNORMAL HIGH (ref 9–35)

## 2020-11-27 NOTE — Procedures (Signed)
Successful removal of tunneled (R)IJ HD cath No complications.  Ascencion Dike PA-C Interventional Radiology 11/27/2020 2:27 PM

## 2020-11-28 LAB — BLOOD GAS, ARTERIAL
Acid-base deficit: 3.3 mmol/L — ABNORMAL HIGH (ref 0.0–2.0)
Acid-base deficit: 2.5 mmol/L — ABNORMAL HIGH (ref 0.0–2.0)
Bicarbonate: 24.4 mmol/L (ref 20.0–28.0)
Bicarbonate: 23.8 mmol/L (ref 20.0–28.0)
FIO2: 44
FIO2: 28
O2 Saturation: 98.1 %
O2 Saturation: 87.8 %
Patient temperature: 37
Patient temperature: 37
pCO2 arterial: 72.5 mmHg (ref 32.0–48.0)
pCO2 arterial: 57 mmHg — ABNORMAL HIGH (ref 32.0–48.0)
pH, Arterial: 7.154 — CL (ref 7.350–7.450)
pH, Arterial: 7.244 — ABNORMAL LOW (ref 7.350–7.450)
pO2, Arterial: 132 mmHg — ABNORMAL HIGH (ref 83.0–108.0)
pO2, Arterial: 61.8 mmHg — ABNORMAL LOW (ref 83.0–108.0)

## 2020-11-28 LAB — RENAL FUNCTION PANEL
Albumin: 2.4 g/dL — ABNORMAL LOW (ref 3.5–5.0)
Anion gap: 9 (ref 5–15)
BUN: 53 mg/dL — ABNORMAL HIGH (ref 6–20)
CO2: 26 mmol/L (ref 22–32)
Calcium: 8.6 mg/dL — ABNORMAL LOW (ref 8.9–10.3)
Chloride: 101 mmol/L (ref 98–111)
Creatinine, Ser: 3.91 mg/dL — ABNORMAL HIGH (ref 0.44–1.00)
GFR, Estimated: 13 mL/min — ABNORMAL LOW (ref >60)
Glucose, Bld: 128 mg/dL — ABNORMAL HIGH (ref 70–99)
Phosphorus: 3.3 mg/dL (ref 2.5–4.6)
Potassium: 4.2 mmol/L (ref 3.5–5.1)
Sodium: 136 mmol/L (ref 135–145)

## 2020-11-28 LAB — CBC
HCT: 32.4 % — ABNORMAL LOW (ref 36.0–46.0)
Hemoglobin: 9.5 g/dL — ABNORMAL LOW (ref 12.0–15.0)
MCH: 29.3 pg (ref 26.0–34.0)
MCHC: 29.3 g/dL — ABNORMAL LOW (ref 30.0–36.0)
MCV: 100 fL (ref 80.0–100.0)
Platelets: 144 10*3/uL — ABNORMAL LOW (ref 150–400)
RBC: 3.24 MIL/uL — ABNORMAL LOW (ref 3.87–5.11)
RDW: 21.7 % — ABNORMAL HIGH (ref 11.5–15.5)
WBC: 25.9 10*3/uL — ABNORMAL HIGH (ref 4.0–10.5)
nRBC: 0.5 % — ABNORMAL HIGH (ref 0.0–0.2)

## 2020-11-28 NOTE — Progress Notes (Signed)
[facility]  ROUNDING NOTE   Subjective:   Patient lethargic today. PermCath was removed yesterday. WBC count still high at 25.9.  Objective:  Vital signs in last 24 hours:  Temperature 96.9 pulse 95 respirations 15 blood pressure 124/68   Physical Exam: General:  No acute distress  Head:  Normocephalic, atraumatic. Moist oral mucosal membranes  Eyes:  Anicteric  Neck:  Supple  Lungs:   Clear to auscultation, normal effort  Heart:  S1S2 no rubs  Abdomen:   Soft, nontender, bowel sounds present  Extremities:  No lower extremity edema  Neurologic:  Lethargic today  Skin:  No acute rash  Access:  Dialysis catheter removed    Basic Metabolic Panel: Recent Labs  Lab 11/23/20 0557 11/26/20 0559 11/28/20 0423  NA 137 138 136  K 5.1 5.0 4.2  CL 100 103 101  CO2 27 21* 26  GLUCOSE 90 80 128*  BUN 44* 59* 53*  CREATININE 4.18* 4.67* 3.91*  CALCIUM 8.5* 8.7* 8.6*  PHOS 4.5 1.9* 3.3    Liver Function Tests: Recent Labs  Lab 11/23/20 0557 11/26/20 0559 11/28/20 0423  ALBUMIN 3.0* 2.9* 2.4*   No results for input(s): LIPASE, AMYLASE in the last 168 hours. Recent Labs  Lab 11/23/20 0557 11/27/20 0121  AMMONIA 55* 45*    CBC: Recent Labs  Lab 11/23/20 0557 11/26/20 0559 11/27/20 0121 11/28/20 0423  WBC 7.4 17.5* 27.6* 25.9*  HGB 8.7* 8.7* 8.4* 9.5*  HCT 29.6* 30.0* 27.9* 32.4*  MCV 105.3* 102.4* 98.9 100.0  PLT 150 150 150 144*    Cardiac Enzymes: No results for input(s): CKTOTAL, CKMB, CKMBINDEX, TROPONINI in the last 168 hours.  BNP: Invalid input(s): POCBNP  CBG: No results for input(s): GLUCAP in the last 168 hours.  Microbiology: Results for orders placed or performed during the hospital encounter of 10/16/20  Culture, blood (routine x 2)     Status: None   Collection Time: 10/31/20  8:54 AM   Specimen: BLOOD LEFT HAND  Result Value Ref Range Status   Specimen Description BLOOD LEFT HAND  Final   Special Requests   Final     BOTTLES DRAWN AEROBIC AND ANAEROBIC Blood Culture results may not be optimal due to an inadequate volume of blood received in culture bottles   Culture   Final    NO GROWTH 5 DAYS Performed at [facility], [place] [address] [place], [place] [address]    Report Status 11/05/2020 FINAL  Final  Culture, blood (routine x 2)     Status: None   Collection Time: 10/31/20  9:48 AM   Specimen: BLOOD LEFT HAND  Result Value Ref Range Status   Specimen Description BLOOD LEFT HAND  Final   Special Requests   Final    BOTTLES DRAWN AEROBIC ONLY Blood Culture adequate volume   Culture   Final    NO GROWTH 5 DAYS Performed at [facility], [place] [address], [place], [place] [address]    Report Status 11/05/2020 FINAL  Final  Expectorated Sputum Assessment w Gram Stain, Rflx to Resp Cult     Status: None   Collection Time: 11/26/20  8:37 AM   Specimen: Sputum  Result Value Ref Range Status   Specimen Description SPUTUM  Final   Special Requests NONE  Final   Sputum evaluation   Final    THIS SPECIMEN IS ACCEPTABLE FOR SPUTUM CULTURE Performed at [facility], [place],  [place] [address]    Report Status 11/27/2020 FINAL  Final  Culture, Respiratory w Gram Stain     Status: None (Preliminary result)   Collection Time: 11/26/20  8:37 AM   Specimen: SPU  Result Value Ref Range Status   Specimen Description SPUTUM  Final   Special Requests NONE Reflexed from [id number]  Final   Gram Stain   Final    ABUNDANT WBC PRESENT,BOTH PMN AND MONONUCLEAR FEW GRAM VARIABLE ROD RARE GRAM POSITIVE COCCI Performed at [facility], [place] [address], [place], [place] [address]    Culture RARE GRAM NEGATIVE RODS  Final   Report Status PENDING  Incomplete  Culture, blood (routine x 2)     Status: Abnormal (Preliminary result)   Collection Time: 11/26/20 10:21 AM   Specimen: BLOOD LEFT HAND  Result Value Ref Range Status   Specimen Description BLOOD LEFT HAND  Final   Special  Requests   Final    BOTTLES DRAWN AEROBIC AND ANAEROBIC Blood Culture adequate volume   Culture  Setup Time   Final    AEROBIC BOTTLE ONLY GRAM NEGATIVE RODS CRITICAL RESULT CALLED TO, READ BACK BY AND VERIFIED WITH: RN D [place] [id number] AT 0717 AM BY CM    Culture (A)  Final    KLEBSIELLA PNEUMONIAE SUSCEPTIBILITIES TO FOLLOW Performed at [facility], [place] [address], [place], [place] [address]    Report Status PENDING  Incomplete  Blood Culture ID Panel (Reflexed)     Status: Abnormal   Collection Time: 11/26/20 10:21 AM  Result Value Ref Range Status   Enterococcus faecalis NOT DETECTED NOT DETECTED Final   Enterococcus Faecium NOT DETECTED NOT DETECTED Final   Listeria monocytogenes NOT DETECTED NOT DETECTED Final   Staphylococcus species NOT DETECTED NOT DETECTED Final   Staphylococcus aureus (BCID) NOT DETECTED NOT DETECTED Final   Staphylococcus epidermidis NOT DETECTED NOT DETECTED Final   Staphylococcus lugdunensis NOT DETECTED NOT DETECTED Final   Streptococcus species NOT DETECTED NOT DETECTED Final   Streptococcus agalactiae NOT DETECTED NOT DETECTED Final   Streptococcus pneumoniae NOT DETECTED NOT DETECTED Final   Streptococcus pyogenes NOT DETECTED NOT DETECTED Final   A.calcoaceticus-baumannii NOT DETECTED NOT DETECTED Final   Bacteroides fragilis NOT DETECTED NOT DETECTED Final   Enterobacterales DETECTED (A) NOT DETECTED Final    Comment: Enterobacterales represent a large order of gram negative bacteria, not a single organism. CRITICAL RESULT CALLED TO, READ BACK BY AND VERIFIED WITH: RN D [place] [id number] AT 6384 AM BY CM    Enterobacter cloacae complex NOT DETECTED NOT DETECTED Final   Escherichia coli NOT DETECTED NOT DETECTED Final   Klebsiella aerogenes NOT DETECTED NOT DETECTED Final   Klebsiella oxytoca NOT DETECTED NOT DETECTED Final   Klebsiella pneumoniae DETECTED (A) NOT DETECTED Final    Comment: CRITICAL RESULT CALLED TO, READ BACK BY  AND VERIFIED WITH: RN D [place] [id number] AT 0717 AM BY CM    Proteus species NOT DETECTED NOT DETECTED Final   Salmonella species NOT DETECTED NOT DETECTED Final   Serratia marcescens NOT DETECTED NOT DETECTED Final   Haemophilus influenzae NOT DETECTED NOT DETECTED Final   Neisseria meningitidis NOT DETECTED NOT DETECTED Final   Pseudomonas aeruginosa NOT DETECTED NOT DETECTED Final   Stenotrophomonas maltophilia NOT DETECTED NOT DETECTED Final   Candida albicans NOT DETECTED NOT DETECTED Final   Candida auris NOT DETECTED NOT DETECTED Final   Candida glabrata NOT DETECTED NOT DETECTED Final   Candida  krusei NOT DETECTED NOT DETECTED Final   Candida parapsilosis NOT DETECTED NOT DETECTED Final   Candida tropicalis NOT DETECTED NOT DETECTED Final   Cryptococcus neoformans/gattii NOT DETECTED NOT DETECTED Final   CTX-M ESBL NOT DETECTED NOT DETECTED Final   Carbapenem resistance IMP NOT DETECTED NOT DETECTED Final   Carbapenem resistance KPC NOT DETECTED NOT DETECTED Final   Carbapenem resistance NDM NOT DETECTED NOT DETECTED Final   Carbapenem resist OXA 48 LIKE NOT DETECTED NOT DETECTED Final   Carbapenem resistance VIM NOT DETECTED NOT DETECTED Final    Comment: Performed at [facility], [place] [address] [place], [place] [address]  Culture, blood (routine x 2)     Status: None (Preliminary result)   Collection Time: 11/26/20  5:00 PM   Specimen: BLOOD LEFT HAND  Result Value Ref Range Status   Specimen Description BLOOD LEFT HAND  Final   Special Requests   Final    BOTTLES DRAWN AEROBIC AND ANAEROBIC Blood Culture results may not be optimal due to an inadequate volume of blood received in culture bottles   Culture   Final    NO GROWTH < 24 HOURS Performed at [facility], [place] [address], [place], [place] [address]    Report Status PENDING  Incomplete    Coagulation Studies: No results for input(s): LABPROT, INR in the last 72 hours.  Urinalysis: No results  for input(s): COLORURINE, LABSPEC, PHURINE, GLUCOSEU, HGBUR, BILIRUBINUR, KETONESUR, PROTEINUR, UROBILINOGEN, NITRITE, LEUKOCYTESUR in the last 72 hours.  Invalid input(s): APPERANCEUR    Imaging: IR Removal Tun Cv Cath W/O FL  Result Date: 11/27/2020 INDICATION: Bacteremia.  Request removal of tunneled hemodialysis catheter. EXAM: REMOVAL OF TUNNELED RIGHT IJ HEMODIALYSIS CATHETER MEDICATIONS: None COMPLICATIONS: None immediate. PROCEDURE: Informed written consent was obtained from the patient following an explanation of the procedure, risks, benefits and alternatives to treatment. A time out was performed prior to the initiation of the procedure. Maximal barrier sterile technique was utilized including caps, mask, sterile gowns, sterile gloves, large sterile drape, hand hygiene, and chlorhexidine. Utilizing a combination of blunt dissection and gentle traction, the catheter was removed intact. Hemostasis was obtained with manual compression. A dressing was placed. The patient tolerated the procedure well without immediate post procedural complication. IMPRESSION: Successful removal of tunneled right IJ dialysis catheter. Read by: Ascencion Dike PA-C Electronically Signed   By: Corrie Mckusick D.O.   On: 11/27/2020 14:33     Medications:       Assessment/ Plan:  60 y.o. 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   1.  ESRD on HD.  Dialysis being held today as PermCath has been removed.  Reevaluate for dialysis treatment on Friday.  2.  Anemia of chronic kidney disease.   Lab Results  Component Value Date   HGB 9.5 (L) 11/28/2020    Hemoglobin up to 9.5.  Continue to monitor hemoglobin.  3.  Secondary hyperparathyroidism.  Phosphorus 3.3 and at target.  4.  Thrombocytopenia.  Platelets slightly lower today at 144,000.  5.  Hyponatremia.  Serum sodium likely to trend down now that we have held dialysis.  Serum sodium currently 136.  6.  Leukocytosis/fever/sepsis.  Klebsiella  growing in the blood.  Antibiotic management as per hospitalist.  Consider infectious disease consultation.  PermCath was removed.   LOS: 0 Talecia Sherlin 4/6/202211:26 AM

## 2020-11-29 ENCOUNTER — Other Ambulatory Visit (HOSPITAL_COMMUNITY): Payer: Medicare Other

## 2020-11-29 LAB — CBC
HCT: 31.2 % — ABNORMAL LOW (ref 36.0–46.0)
Hemoglobin: 9.2 g/dL — ABNORMAL LOW (ref 12.0–15.0)
MCH: 30.2 pg (ref 26.0–34.0)
MCHC: 29.5 g/dL — ABNORMAL LOW (ref 30.0–36.0)
MCV: 102.3 fL — ABNORMAL HIGH (ref 80.0–100.0)
Platelets: 89 10*3/uL — ABNORMAL LOW (ref 150–400)
RBC: 3.05 MIL/uL — ABNORMAL LOW (ref 3.87–5.11)
RDW: 21.9 % — ABNORMAL HIGH (ref 11.5–15.5)
WBC: 18 10*3/uL — ABNORMAL HIGH (ref 4.0–10.5)
nRBC: 0.7 % — ABNORMAL HIGH (ref 0.0–0.2)

## 2020-11-29 LAB — CULTURE, RESPIRATORY W GRAM STAIN

## 2020-11-29 LAB — AMMONIA: Ammonia: 41 umol/L — ABNORMAL HIGH (ref 9–35)

## 2020-11-29 LAB — CULTURE, BLOOD (ROUTINE X 2): Special Requests: ADEQUATE

## 2020-11-29 LAB — BLOOD GAS, ARTERIAL
Acid-base deficit: 2.3 mmol/L — ABNORMAL HIGH (ref 0.0–2.0)
Acid-base deficit: 2.6 mmol/L — ABNORMAL HIGH (ref 0.0–2.0)
Bicarbonate: 24.2 mmol/L (ref 20.0–28.0)
Bicarbonate: 23.5 mmol/L (ref 20.0–28.0)
FIO2: 35
FIO2: 35
O2 Saturation: 63.6 %
O2 Saturation: 92.7 %
Patient temperature: 35.9
Patient temperature: 36
pCO2 arterial: 56.4 mmHg — ABNORMAL HIGH (ref 32.0–48.0)
pCO2 arterial: 51.5 mmHg — ABNORMAL HIGH (ref 32.0–48.0)
pH, Arterial: 7.248 — ABNORMAL LOW (ref 7.350–7.450)
pH, Arterial: 7.274 — ABNORMAL LOW (ref 7.350–7.450)
pO2, Arterial: 36.6 mmHg — CL (ref 83.0–108.0)
pO2, Arterial: 68.5 mmHg — ABNORMAL LOW (ref 83.0–108.0)

## 2020-11-29 LAB — LACTIC ACID, PLASMA: Lactic Acid, Venous: 2 mmol/L (ref 0.5–1.9)

## 2020-11-29 NOTE — Progress Notes (Signed)
PROGRESS NOTE    JNYA BROSSARD  UXN:[id number] DOB: 04/26/1961 DOA: 10/16/2020   Brief Narrative: Theresa Russell is an 60 y.o. female with multiple medical problems including ESRD on dialysis, chronic hypoxemic respiratory failure secondary to COPD, hypertension, hyperlipidemia, coronary disease, diabetes mellitus who apparently was admitted to [facility] when she was brought in by EMS with altered mental status, generalized weakness.  In the emergency room she was found to be tachycardic with heart rate in the 140s and atrial flutter.  Blood pressure was stable.  She was noted to have cellulitis, necrotic wounds to bilateral lower extremities. CT was done and it showed findings concerning for left ischial rectal fossa necrotizing fasciitis.  General surgery was consulted.  They initially recommended transfer to tertiary care center.  She was started on broad-spectrum antimicrobials.  However, transfer was on unsuccessful.  Patient was taken to surgery and underwent incision and drainage of the left perineal and ischio rectal abscess measuring about 5 x 5 x 10 cm.  She subsequently developed septic shock requiring vasopressors.  She was given aggressive hydration and broad-spectrum antimicrobial coverage.  Her wound cultures grew Morganella, E. coli, MRSA.  She was initially treated with IV Zosyn, clindamycin. Nephrology was consulted and patient was receiving hemodialysis.  She also had leukocytosis which improved initially but then again there was an upward trend on 10/11/2020.  She underwent repeat imaging of her pelvis which showed concern for necrotizing fasciitis involving the left thigh extending into the groin.  General surgery was consulted again and patient underwent bedside debridement.  After the procedure her WBC count improved.  Patient failed swallow study therefore placed on tube feeding.  Due to her complex medical problems she was transferred and admitted to [facility] [place] on 10/16/2020. Here she was treated with IV cefepime, Zyvox.  However, she had worsening thrombocytopenia with drop in Platelet count as low as 29.  Therefore recommended to switch to doxycycline.  She completed treatment with the cefepime.  However, she had worsening leukocytosis and developed left groin/thigh cellulitis.  CT of the abdomen and pelvis without contrast was done on 10/25/2020 per report 12.9 cm mature cystic teratoma within the left ovary.  Also reported was anasarca with extensive subcutaneous body wall edema, mild ascites, small right pleural effusion.  For her thrombocytopenia she was given steroids with some improvement in the platelet count. Due to her worsening cellulitis left thigh ultrasound was done which per report showed complex collection 4 cm dissecting through the subcutaneous fat likely abscess/phlegmon. -She underwent drainage of the left inner thigh abscess on 11/08/2020.  Received treatment with IV meropenem.   11/29/2020: Patient had worsening with decompensation.  She had to be placed on BiPAP yesterday.  Increased somnolence.  Respiratory cultures from 11/26/2020 per report Klebsiella pneumonia.  She also had blood cultures from 11/26/2020 that came back as gram-negative rods, Klebsiella.  She had a PermCath which has been removed.  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  Likely secondary to volume overload/pulmonary edema/pleural effusion, COPD  with acute exacerbation. She received treatment with multiple rounds of antibiotics.  She improved but again decompensated.  Currently on BiPAP.  Respiratory culture showing Klebsiella.  She has been started on treatment with cefepime, Flagyl, Zyvox.  At this time suggest to discontinue the Zyvox.  Continue treatment with the cefepime.  She also has bacteremia with Klebsiella.  Once the blood cultures finalized we may be able to de-escalate to ceftriaxone. She also has dysphagia and high risk for aspiration and aspiration pneumonia.  Continue to monitor closely.  If her respiratory status continues to worsen suggest repeat chest imaging preferably chest CT which can be done without contrast due to her ESRD.  Sepsis: In the setting of gram-negative bacteremia with Klebsiella.  She had blood cultures that showed Klebsiella.  She also had fever and leukocytosis.  On treatment with IV cefepime, Zyvox, Flagyl.  For now suggest to continue treatment with the Flagyl for anaerobic coverage for the pneumonia.  Suggest to discontinue Zyvox.  Suggest to continue cefepime for now.  PermCath has been removed.  Repeat blood cultures ordered.  Once repeat blood cultures are negative we may be able to de-escalate to ceftriaxone.  Will need to plan for a treatment of 14 days from the date of negative blood cultures.  Left ischio rectal fossa necrotizing fasciitis status post debridement/perirectal abscess: She had debridement done and she also had drainage of the perirectal abscess.  She had cultures that showed Morganella morganii, E. coli, MRSA at the outside facility.  She received treatment with IV cefepime, Zyvox.  Unfortunately had worsening thrombocytopenia.  Therefore recommended to DC the Zyvox and and treated with doxycycline. She completed treatment with  doxycycline and cefepime.  However, she had left thigh abscess/phlegmon therefore again had to be treated with meropenem. Continue local wound care.  If wound worsening consider consulting surgery to evaluate.  Left thigh abscess/phlegmon: Left thigh area was indurated with tenderness.  Ultrasound showed probable abscess/phlegmon.  Therefore suggested treatment with meropenem.  Suggested warm compresses to liquefy the phlegmon.  She had a drainage of the left inner thigh abscess on 11/08/2020.  She will receive treatment with meropenem now started on antibiotics again in the setting of sepsis with gram-negative bacteremia.  Continue local wound care. Continue to monitor.  Bilateral lower extremity wounds: Continue local wound care.    Thrombocytopenia: Exact etiology for the thrombocytopenia is unclear. HIT panel was negative per the primary team. She received treatment with steroids.  Platelet count improved but now decreased again in the setting of sepsis.  Continue to monitor closely.  End-stage renal disease on hemodialysis: Dialysis per nephrology team.  Medications renally dosed.  Diabetes mellitus type 2: Continue to monitor Accu-Cheks, medications and management of diabetes per the primary team.  She will need proper glycemic control in order to enable wound healing.  COPD: Continue to monitor, management per the primary team.  Left ovarian cystic teratoma: She is high risk for torsion.  Further management per primary team.  Dysphagia/protein calorie malnutrition: Due to her dysphagia she is very high risk for aspiration and recurrent aspiration pneumonia despite being on antibiotics.  Encephalopathy: Likely toxic/metabolic.  Continue supportive management per the primary team.  Chronic systolic congestive heart failure with EF around 40%: Continue medication and management per the primary team.  She was also noted to have pulmonary hypertension on the echocardiogram.  Atrial  flutter: On medications per cardiology. Further management per the primary team and cardiology.  Unfortunately due to her complex  medical problems she is very high risk for worsening and decompensation.  Plan of care discussed with the primary team and pharmacy.  Subjective: She had a left inner thigh abscess drained on 11/08/2020.  She improved briefly.  However, now again decompensated acutely.  Currently on BiPAP due to worsening respiratory failure.  Also noted to have sepsis with bacteremia with blood cultures growing Klebsiella.  Objective: Vitals: Temperature 96.5, pulse 74, respiratory rate 20, blood pressure 143/79, oxygen saturation 100%  Examination: Constitutional: Chronically ill-appearing female, on BiPAP Head: Atraumatic, normocephalic Eyes: PERLA, EOMI  ENMT: external ears and nose appear normal, normal hearing, Lips appears normal, moist oral mucosa Neck:  supple CVS: S1-S2  Respiratory: Decreased breath sounds lower lobes, coarse breath sounds with scattered rhonchi, no wheezing Abdomen: Obese, nontender,normal bowel sounds, lower abdominal wall edema Musculoskeletal: Bilateral lower extremity wounds with dressing in place, left thigh abscess drained.  Has dressing in place Neuro: Severe debility with generalized weakness Psych: Lethargic Skin: Bilateral lower extremity wounds, left ischial rectal area wound status post surgical debridement  Data Reviewed: I have personally reviewed following labs and imaging studies  CBC: Recent Labs  Lab 11/23/20 0557 11/26/20 0559 11/27/20 0121 11/28/20 0423 11/29/20 0523  WBC 7.4 17.5* 27.6* 25.9* 18.0*  HGB 8.7* 8.7* 8.4* 9.5* 9.2*  HCT 29.6* 30.0* 27.9* 32.4* 31.2*  MCV 105.3* 102.4* 98.9 100.0 102.3*  PLT 150 150 150 144* 89*    Basic Metabolic Panel: Recent Labs  Lab 11/23/20 0557 11/26/20 0559 11/28/20 0423  NA 137 138 136  K 5.1 5.0 4.2  CL 100 103 101  CO2 27 21* 26  GLUCOSE 90 80 128*  BUN 44* 59* 53*   CREATININE 4.18* 4.67* 3.91*  CALCIUM 8.5* 8.7* 8.6*  PHOS 4.5 1.9* 3.3    GFR: CrCl cannot be calculated (Unknown ideal weight.).  Liver Function Tests: Recent Labs  Lab 11/23/20 0557 11/26/20 0559 11/28/20 0423  ALBUMIN 3.0* 2.9* 2.4*    CBG: No results for input(s): GLUCAP in the last 168 hours.   Recent Results (from the past 240 hour(s))  Expectorated Sputum Assessment w Gram Stain, Rflx to Resp Cult     Status: None   Collection Time: 11/26/20  8:37 AM   Specimen: Sputum  Result Value Ref Range Status   Specimen Description SPUTUM  Final   Special Requests NONE  Final   Sputum evaluation   Final    THIS SPECIMEN IS ACCEPTABLE FOR SPUTUM CULTURE Performed at [facility], [address], [place], [place] [address]    Report Status 11/27/2020 FINAL  Final  Culture, Respiratory w Gram Stain     Status: None   Collection Time: 11/26/20  8:37 AM   Specimen: SPU  Result Value Ref Range Status   Specimen Description SPUTUM  Final   Special Requests NONE Reflexed from [id number]  Final   Gram Stain   Final    ABUNDANT WBC PRESENT,BOTH PMN AND MONONUCLEAR FEW GRAM VARIABLE ROD RARE GRAM POSITIVE COCCI Performed at [facility], [place] [address], [place], [place] [address]    Culture RARE KLEBSIELLA PNEUMONIAE  Final   Report Status 11/29/2020 FINAL  Final   Organism ID, Bacteria KLEBSIELLA PNEUMONIAE  Final      Susceptibility   Klebsiella pneumoniae - MIC*    AMPICILLIN >=32 RESISTANT Resistant     CEFAZOLIN <=4 SENSITIVE Sensitive     CEFEPIME <=0.12 SENSITIVE Sensitive     CEFTAZIDIME <=1 SENSITIVE Sensitive  CEFTRIAXONE <=0.25 SENSITIVE Sensitive     CIPROFLOXACIN <=0.25 SENSITIVE Sensitive     GENTAMICIN <=1 SENSITIVE Sensitive     IMIPENEM <=0.25 SENSITIVE Sensitive     TRIMETH/SULFA <=20 SENSITIVE Sensitive     AMPICILLIN/SULBACTAM 4 SENSITIVE Sensitive     PIP/TAZO <=4 SENSITIVE Sensitive     * RARE KLEBSIELLA PNEUMONIAE  Culture, blood  (routine x 2)     Status: Abnormal   Collection Time: 11/26/20 10:21 AM   Specimen: BLOOD LEFT HAND  Result Value Ref Range Status   Specimen Description BLOOD LEFT HAND  Final   Special Requests   Final    BOTTLES DRAWN AEROBIC AND ANAEROBIC Blood Culture adequate volume   Culture  Setup Time   Final    AEROBIC BOTTLE ONLY GRAM NEGATIVE RODS CRITICAL RESULT CALLED TO, READ BACK BY AND VERIFIED WITH: RN D [place] [id number] AT 0717 AM BY CM Performed at [facility], [place] [address] [place], [place] [address]    Culture KLEBSIELLA PNEUMONIAE (A)  Final   Report Status 11/29/2020 FINAL  Final   Organism ID, Bacteria KLEBSIELLA PNEUMONIAE  Final      Susceptibility   Klebsiella pneumoniae - MIC*    AMPICILLIN >=32 RESISTANT Resistant     CEFAZOLIN <=4 SENSITIVE Sensitive     CEFEPIME <=0.12 SENSITIVE Sensitive     CEFTAZIDIME <=1 SENSITIVE Sensitive     CEFTRIAXONE <=0.25 SENSITIVE Sensitive     CIPROFLOXACIN <=0.25 SENSITIVE Sensitive     GENTAMICIN <=1 SENSITIVE Sensitive     IMIPENEM <=0.25 SENSITIVE Sensitive     TRIMETH/SULFA <=20 SENSITIVE Sensitive     AMPICILLIN/SULBACTAM 4 SENSITIVE Sensitive     PIP/TAZO <=4 SENSITIVE Sensitive     * KLEBSIELLA PNEUMONIAE  Blood Culture ID Panel (Reflexed)     Status: Abnormal   Collection Time: 11/26/20 10:21 AM  Result Value Ref Range Status   Enterococcus faecalis NOT DETECTED NOT DETECTED Final   Enterococcus Faecium NOT DETECTED NOT DETECTED Final   Listeria monocytogenes NOT DETECTED NOT DETECTED Final   Staphylococcus species NOT DETECTED NOT DETECTED Final   Staphylococcus aureus (BCID) NOT DETECTED NOT DETECTED Final   Staphylococcus epidermidis NOT DETECTED NOT DETECTED Final   Staphylococcus lugdunensis NOT DETECTED NOT DETECTED Final   Streptococcus species NOT DETECTED NOT DETECTED Final   Streptococcus agalactiae NOT DETECTED NOT DETECTED Final   Streptococcus pneumoniae NOT DETECTED NOT DETECTED Final    Streptococcus pyogenes NOT DETECTED NOT DETECTED Final   A.calcoaceticus-baumannii NOT DETECTED NOT DETECTED Final   Bacteroides fragilis NOT DETECTED NOT DETECTED Final   Enterobacterales DETECTED (A) NOT DETECTED Final    Comment: Enterobacterales represent a large order of gram negative bacteria, not a single organism. CRITICAL RESULT CALLED TO, READ BACK BY AND VERIFIED WITH: RN D [place] [id number] AT 5625 AM BY CM    Enterobacter cloacae complex NOT DETECTED NOT DETECTED Final   Escherichia coli NOT DETECTED NOT DETECTED Final   Klebsiella aerogenes NOT DETECTED NOT DETECTED Final   Klebsiella oxytoca NOT DETECTED NOT DETECTED Final   Klebsiella pneumoniae DETECTED (A) NOT DETECTED Final    Comment: CRITICAL RESULT CALLED TO, READ BACK BY AND VERIFIED WITH: RN D [place] [id number] AT 0717 AM BY CM    Proteus species NOT DETECTED NOT DETECTED Final   Salmonella species NOT DETECTED NOT DETECTED Final   Serratia marcescens NOT DETECTED NOT DETECTED Final   Haemophilus influenzae NOT DETECTED NOT DETECTED Final   Neisseria meningitidis  NOT DETECTED NOT DETECTED Final   Pseudomonas aeruginosa NOT DETECTED NOT DETECTED Final   Stenotrophomonas maltophilia NOT DETECTED NOT DETECTED Final   Candida albicans NOT DETECTED NOT DETECTED Final   Candida auris NOT DETECTED NOT DETECTED Final   Candida glabrata NOT DETECTED NOT DETECTED Final   Candida krusei NOT DETECTED NOT DETECTED Final   Candida parapsilosis NOT DETECTED NOT DETECTED Final   Candida tropicalis NOT DETECTED NOT DETECTED Final   Cryptococcus neoformans/gattii NOT DETECTED NOT DETECTED Final   CTX-M ESBL NOT DETECTED NOT DETECTED Final   Carbapenem resistance IMP NOT DETECTED NOT DETECTED Final   Carbapenem resistance KPC NOT DETECTED NOT DETECTED Final   Carbapenem resistance NDM NOT DETECTED NOT DETECTED Final   Carbapenem resist OXA 48 LIKE NOT DETECTED NOT DETECTED Final   Carbapenem resistance VIM NOT DETECTED NOT  DETECTED Final    Comment: Performed at [facility], [address] [place], [place] [address]  Culture, blood (routine x 2)     Status: None (Preliminary result)   Collection Time: 11/26/20  5:00 PM   Specimen: BLOOD LEFT HAND  Result Value Ref Range Status   Specimen Description BLOOD LEFT HAND  Final   Special Requests   Final    BOTTLES DRAWN AEROBIC AND ANAEROBIC Blood Culture results may not be optimal due to an inadequate volume of blood received in culture bottles   Culture   Final    NO GROWTH 3 DAYS Performed at [facility], [place] [address], [place], [place] [address]    Report Status PENDING  Incomplete  Culture, blood (routine x 2)     Status: None (Preliminary result)   Collection Time: 11/28/20 12:16 PM   Specimen: BLOOD RIGHT HAND  Result Value Ref Range Status   Specimen Description BLOOD RIGHT HAND  Final   Special Requests   Final    BOTTLES DRAWN AEROBIC ONLY Blood Culture adequate volume   Culture   Final    NO GROWTH 1 DAY Performed at [facility], [place] [address] [place], [place] [address]    Report Status PENDING  Incomplete  Culture, blood (routine x 2)     Status: None (Preliminary result)   Collection Time: 11/28/20 12:22 PM   Specimen: BLOOD RIGHT FOREARM  Result Value Ref Range Status   Specimen Description BLOOD RIGHT FOREARM  Final   Special Requests   Final    BOTTLES DRAWN AEROBIC ONLY Blood Culture adequate volume   Culture   Final    NO GROWTH 1 DAY Performed at [facility], [place] [address], [place], [place] [address]    Report Status PENDING  Incomplete     Scheduled Meds: Please see MAR   Yaakov Guthrie, MD   11/29/2020, 7:28 PM

## 2020-11-30 ENCOUNTER — Other Ambulatory Visit (HOSPITAL_COMMUNITY): Payer: Medicare Other

## 2020-11-30 HISTORY — PX: IR US GUIDE VASC ACCESS RIGHT: IMG2390

## 2020-11-30 HISTORY — PX: IR FLUORO GUIDE CV LINE RIGHT: IMG2283

## 2020-11-30 LAB — RENAL FUNCTION PANEL
Albumin: 2.4 g/dL — ABNORMAL LOW (ref 3.5–5.0)
Anion gap: 17 — ABNORMAL HIGH (ref 5–15)
BUN: 74 mg/dL — ABNORMAL HIGH (ref 6–20)
CO2: 21 mmol/L — ABNORMAL LOW (ref 22–32)
Calcium: 8.7 mg/dL — ABNORMAL LOW (ref 8.9–10.3)
Chloride: 102 mmol/L (ref 98–111)
Creatinine, Ser: 5.04 mg/dL — ABNORMAL HIGH (ref 0.44–1.00)
GFR, Estimated: 9 mL/min — ABNORMAL LOW
Glucose, Bld: 175 mg/dL — ABNORMAL HIGH (ref 70–99)
Phosphorus: 4.3 mg/dL (ref 2.5–4.6)
Potassium: 4.5 mmol/L (ref 3.5–5.1)
Sodium: 140 mmol/L (ref 135–145)

## 2020-11-30 LAB — CBC
HCT: 29.2 % — ABNORMAL LOW (ref 36.0–46.0)
Hemoglobin: 9 g/dL — ABNORMAL LOW (ref 12.0–15.0)
MCH: 29.8 pg (ref 26.0–34.0)
MCHC: 30.8 g/dL (ref 30.0–36.0)
MCV: 96.7 fL (ref 80.0–100.0)
Platelets: 96 K/uL — ABNORMAL LOW (ref 150–400)
RBC: 3.02 MIL/uL — ABNORMAL LOW (ref 3.87–5.11)
RDW: 21.1 % — ABNORMAL HIGH (ref 11.5–15.5)
WBC: 11 K/uL — ABNORMAL HIGH (ref 4.0–10.5)
nRBC: 1.2 % — ABNORMAL HIGH (ref 0.0–0.2)

## 2020-11-30 NOTE — Progress Notes (Signed)
  Echocardiogram 2D Echocardiogram has been performed.  Darlina Sicilian M 11/30/2020, 2:14 PM

## 2020-11-30 NOTE — Progress Notes (Signed)
[facility]  ROUNDING NOTE   Subjective:  Patient seen and evaluated at bedside. Currently on BiPAP. Patient will need temporary dialysis catheter to be replaced today.   Objective:  Vital signs in last 24 hours:  Temperature 97 pulse 75 respiration 17 blood pressure 133/61   Physical Exam: General:  Currently on BiPAP.  Head:  Normocephalic, atraumatic. Moist oral mucosal membranes  Eyes:  Anicteric  Neck:  Supple  Lungs:   Scattered rales, on BiPAP  Heart:  S1S2 no rubs  Abdomen:   Soft, nontender, bowel sounds present  Extremities:  No lower extremity edema  Neurologic:  Lethargic today  Skin:  No acute rash  Access:  Dialysis catheter removed    Basic Metabolic Panel: Recent Labs  Lab 11/26/20 0559 11/28/20 0423  NA 138 136  K 5.0 4.2  CL 103 101  CO2 21* 26  GLUCOSE 80 128*  BUN 59* 53*  CREATININE 4.67* 3.91*  CALCIUM 8.7* 8.6*  PHOS 1.9* 3.3    Liver Function Tests: Recent Labs  Lab 11/26/20 0559 11/28/20 0423  ALBUMIN 2.9* 2.4*   No results for input(s): LIPASE, AMYLASE in the last 168 hours. Recent Labs  Lab 11/27/20 0121 11/29/20 0523  AMMONIA 45* 41*    CBC: Recent Labs  Lab 11/26/20 0559 11/27/20 0121 11/28/20 0423 11/29/20 0523  WBC 17.5* 27.6* 25.9* 18.0*  HGB 8.7* 8.4* 9.5* 9.2*  HCT 30.0* 27.9* 32.4* 31.2*  MCV 102.4* 98.9 100.0 102.3*  PLT 150 150 144* 89*    Cardiac Enzymes: No results for input(s): CKTOTAL, CKMB, CKMBINDEX, TROPONINI in the last 168 hours.  BNP: Invalid input(s): POCBNP  CBG: No results for input(s): GLUCAP in the last 168 hours.  Microbiology: Results for orders placed or performed during the hospital encounter of 10/16/20  Culture, blood (routine x 2)     Status: None   Collection Time: 10/31/20  8:54 AM   Specimen: BLOOD LEFT HAND  Result Value Ref Range Status   Specimen Description BLOOD LEFT HAND  Final   Special Requests   Final    BOTTLES DRAWN AEROBIC AND ANAEROBIC  Blood Culture results may not be optimal due to an inadequate volume of blood received in culture bottles   Culture   Final    NO GROWTH 5 DAYS Performed at [facility], [place] [address], [place], [place] [address]    Report Status 11/05/2020 FINAL  Final  Culture, blood (routine x 2)     Status: None   Collection Time: 10/31/20  9:48 AM   Specimen: BLOOD LEFT HAND  Result Value Ref Range Status   Specimen Description BLOOD LEFT HAND  Final   Special Requests   Final    BOTTLES DRAWN AEROBIC ONLY Blood Culture adequate volume   Culture   Final    NO GROWTH 5 DAYS Performed at [facility], [place] [address] [place], [place] [address]    Report Status 11/05/2020 FINAL  Final  Expectorated Sputum Assessment w Gram Stain, Rflx to Resp Cult     Status: None   Collection Time: 11/26/20  8:37 AM   Specimen: Sputum  Result Value Ref Range Status   Specimen Description SPUTUM  Final   Special Requests NONE  Final   Sputum evaluation   Final    THIS SPECIMEN IS ACCEPTABLE FOR SPUTUM CULTURE Performed at [facility], [place] [address] [place], [place] [address]    Report Status 11/27/2020 FINAL  Final  Culture, Respiratory w Gram Stain     Status: None   Collection Time: 11/26/20  8:37 AM   Specimen: SPU  Result Value Ref Range Status   Specimen Description SPUTUM  Final   Special Requests NONE Reflexed from [id number]  Final   Gram Stain   Final    ABUNDANT WBC PRESENT,BOTH PMN AND MONONUCLEAR FEW GRAM VARIABLE ROD RARE GRAM POSITIVE COCCI Performed at [facility], [place] [address], [place], [place] [address]    Culture RARE KLEBSIELLA PNEUMONIAE  Final   Report Status 11/29/2020 FINAL  Final   Organism ID, Bacteria KLEBSIELLA PNEUMONIAE  Final      Susceptibility   Klebsiella pneumoniae - MIC*    AMPICILLIN >=32 RESISTANT Resistant     CEFAZOLIN <=4 SENSITIVE Sensitive     CEFEPIME <=0.12 SENSITIVE Sensitive     CEFTAZIDIME <=1 SENSITIVE Sensitive      CEFTRIAXONE <=0.25 SENSITIVE Sensitive     CIPROFLOXACIN <=0.25 SENSITIVE Sensitive     GENTAMICIN <=1 SENSITIVE Sensitive     IMIPENEM <=0.25 SENSITIVE Sensitive     TRIMETH/SULFA <=20 SENSITIVE Sensitive     AMPICILLIN/SULBACTAM 4 SENSITIVE Sensitive     PIP/TAZO <=4 SENSITIVE Sensitive     * RARE KLEBSIELLA PNEUMONIAE  Culture, blood (routine x 2)     Status: Abnormal   Collection Time: 11/26/20 10:21 AM   Specimen: BLOOD LEFT HAND  Result Value Ref Range Status   Specimen Description BLOOD LEFT HAND  Final   Special Requests   Final    BOTTLES DRAWN AEROBIC AND ANAEROBIC Blood Culture adequate volume   Culture  Setup Time   Final    AEROBIC BOTTLE ONLY GRAM NEGATIVE RODS CRITICAL RESULT CALLED TO, READ BACK BY AND VERIFIED WITH: RN D [place] [id number] AT 0717 AM BY CM Performed at [facility], [address], [place], [place] [address]    Culture KLEBSIELLA PNEUMONIAE (A)  Final   Report Status 11/29/2020 FINAL  Final   Organism ID, Bacteria KLEBSIELLA PNEUMONIAE  Final      Susceptibility   Klebsiella pneumoniae - MIC*    AMPICILLIN >=32 RESISTANT Resistant     CEFAZOLIN <=4 SENSITIVE Sensitive     CEFEPIME <=0.12 SENSITIVE Sensitive     CEFTAZIDIME <=1 SENSITIVE Sensitive     CEFTRIAXONE <=0.25 SENSITIVE Sensitive     CIPROFLOXACIN <=0.25 SENSITIVE Sensitive     GENTAMICIN <=1 SENSITIVE Sensitive     IMIPENEM <=0.25 SENSITIVE Sensitive     TRIMETH/SULFA <=20 SENSITIVE Sensitive     AMPICILLIN/SULBACTAM 4 SENSITIVE Sensitive     PIP/TAZO <=4 SENSITIVE Sensitive     * KLEBSIELLA PNEUMONIAE  Blood Culture ID Panel (Reflexed)     Status: Abnormal   Collection Time: 11/26/20 10:21 AM  Result Value Ref Range Status   Enterococcus faecalis NOT DETECTED NOT DETECTED Final   Enterococcus Faecium NOT DETECTED NOT DETECTED Final   Listeria monocytogenes NOT DETECTED NOT DETECTED Final   Staphylococcus species NOT DETECTED NOT DETECTED Final   Staphylococcus aureus  (BCID) NOT DETECTED NOT DETECTED Final   Staphylococcus epidermidis NOT DETECTED NOT DETECTED Final   Staphylococcus lugdunensis NOT DETECTED NOT DETECTED Final   Streptococcus species NOT DETECTED NOT DETECTED Final   Streptococcus agalactiae NOT DETECTED NOT DETECTED Final   Streptococcus pneumoniae NOT DETECTED NOT DETECTED Final   Streptococcus pyogenes NOT DETECTED NOT DETECTED Final   A.calcoaceticus-baumannii NOT DETECTED NOT DETECTED Final   Bacteroides fragilis NOT DETECTED  NOT DETECTED Final   Enterobacterales DETECTED (A) NOT DETECTED Final    Comment: Enterobacterales represent a large order of gram negative bacteria, not a single organism. CRITICAL RESULT CALLED TO, READ BACK BY AND VERIFIED WITH: RN D [place] [id number] AT 0454 AM BY CM    Enterobacter cloacae complex NOT DETECTED NOT DETECTED Final   Escherichia coli NOT DETECTED NOT DETECTED Final   Klebsiella aerogenes NOT DETECTED NOT DETECTED Final   Klebsiella oxytoca NOT DETECTED NOT DETECTED Final   Klebsiella pneumoniae DETECTED (A) NOT DETECTED Final    Comment: CRITICAL RESULT CALLED TO, READ BACK BY AND VERIFIED WITH: RN D [place] [id number] AT 0717 AM BY CM    Proteus species NOT DETECTED NOT DETECTED Final   Salmonella species NOT DETECTED NOT DETECTED Final   Serratia marcescens NOT DETECTED NOT DETECTED Final   Haemophilus influenzae NOT DETECTED NOT DETECTED Final   Neisseria meningitidis NOT DETECTED NOT DETECTED Final   Pseudomonas aeruginosa NOT DETECTED NOT DETECTED Final   Stenotrophomonas maltophilia NOT DETECTED NOT DETECTED Final   Candida albicans NOT DETECTED NOT DETECTED Final   Candida auris NOT DETECTED NOT DETECTED Final   Candida glabrata NOT DETECTED NOT DETECTED Final   Candida krusei NOT DETECTED NOT DETECTED Final   Candida parapsilosis NOT DETECTED NOT DETECTED Final   Candida tropicalis NOT DETECTED NOT DETECTED Final   Cryptococcus neoformans/gattii NOT DETECTED NOT DETECTED  Final   CTX-M ESBL NOT DETECTED NOT DETECTED Final   Carbapenem resistance IMP NOT DETECTED NOT DETECTED Final   Carbapenem resistance KPC NOT DETECTED NOT DETECTED Final   Carbapenem resistance NDM NOT DETECTED NOT DETECTED Final   Carbapenem resist OXA 48 LIKE NOT DETECTED NOT DETECTED Final   Carbapenem resistance VIM NOT DETECTED NOT DETECTED Final    Comment: Performed at [facility], [address] [place], [place] [address]  Culture, blood (routine x 2)     Status: None (Preliminary result)   Collection Time: 11/26/20  5:00 PM   Specimen: BLOOD LEFT HAND  Result Value Ref Range Status   Specimen Description BLOOD LEFT HAND  Final   Special Requests   Final    BOTTLES DRAWN AEROBIC AND ANAEROBIC Blood Culture results may not be optimal due to an inadequate volume of blood received in culture bottles   Culture   Final    NO GROWTH 3 DAYS Performed at [facility], [place] [address] [place], [place] [address]    Report Status PENDING  Incomplete  Culture, blood (routine x 2)     Status: None (Preliminary result)   Collection Time: 11/28/20 12:16 PM   Specimen: BLOOD RIGHT HAND  Result Value Ref Range Status   Specimen Description BLOOD RIGHT HAND  Final   Special Requests   Final    BOTTLES DRAWN AEROBIC ONLY Blood Culture adequate volume   Culture   Final    NO GROWTH 1 DAY Performed at [facility], [place] [address] [place], [place] [address]    Report Status PENDING  Incomplete  Culture, blood (routine x 2)     Status: None (Preliminary result)   Collection Time: 11/28/20 12:22 PM   Specimen: BLOOD RIGHT FOREARM  Result Value Ref Range Status   Specimen Description BLOOD RIGHT FOREARM  Final   Special Requests   Final    BOTTLES DRAWN AEROBIC ONLY Blood Culture adequate volume   Culture   Final    NO GROWTH 1 DAY  Performed at [facility], [place] [address] [place], [place] [address]    Report Status PENDING  Incomplete    Coagulation  Studies: No results for input(s): LABPROT, INR in the last 72 hours.  Urinalysis: No results for input(s): COLORURINE, LABSPEC, PHURINE, GLUCOSEU, HGBUR, BILIRUBINUR, KETONESUR, PROTEINUR, UROBILINOGEN, NITRITE, LEUKOCYTESUR in the last 72 hours.  Invalid input(s): APPERANCEUR    Imaging: DG Abd Portable 1V  Result Date: 11/29/2020 CLINICAL DATA:  NG tube placement EXAM: PORTABLE ABDOMEN - 1 VIEW COMPARISON:  10/16/2020 FINDINGS: NG tube is in the stomach. Relative paucity of gas. No evidence of bowel obstruction. IMPRESSION: NG tube in the mid stomach. Electronically Signed   By: Rolm Baptise M.D.   On: 11/29/2020 01:32     Medications:       Assessment/ Plan:  60 y.o. 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   1.  ESRD on HD.  Patient seen and evaluated at bedside.  She will need her temporary dialysis catheter replaced today.  We will consult IR for this purpose.  2.  Anemia of chronic kidney disease.   Lab Results  Component Value Date   HGB 9.2 (L) 11/29/2020   Hemoglobin currently 9.2.  Continue to monitor CBC periodically.  3.  Secondary hyperparathyroidism.  Phosphorus 3.3 and at target.  4.  Thrombocytopenia.  Platelets down to 89,000.  Recommend continued monitoring.  5.  Hyponatremia.  Repeat serum sodium today.  6.  Leukocytosis/fever/sepsis.  Klebsiella growing in the blood.  Appreciate input from infectious disease.  Recommended that cefepime be continued.   LOS: 0 Theresa Russell 4/8/20228:21 AM

## 2020-11-30 NOTE — Procedures (Signed)
Interventional Radiology Procedure Note  Procedure: Placement of a right IJ approach non-tunneled HD catheter.  Tip is positioned at the superior cavoatrial junction and catheter is ready for immediate use.  Complications: None Recommendations:  - Ok to use - Do not submerge - Routine line care   Signed,  Dulcy Fanny. Earleen Newport, DO

## 2020-12-01 LAB — CULTURE, BLOOD (ROUTINE X 2): Culture: NO GROWTH

## 2020-12-03 LAB — RENAL FUNCTION PANEL
Albumin: 2.4 g/dL — ABNORMAL LOW (ref 3.5–5.0)
Anion gap: 15 (ref 5–15)
BUN: 84 mg/dL — ABNORMAL HIGH (ref 6–20)
CO2: 22 mmol/L (ref 22–32)
Calcium: 9 mg/dL (ref 8.9–10.3)
Chloride: 101 mmol/L (ref 98–111)
Creatinine, Ser: 4.24 mg/dL — ABNORMAL HIGH (ref 0.44–1.00)
GFR, Estimated: 12 mL/min — ABNORMAL LOW (ref >60)
Glucose, Bld: 172 mg/dL — ABNORMAL HIGH (ref 70–99)
Phosphorus: 3.3 mg/dL (ref 2.5–4.6)
Potassium: 3.8 mmol/L (ref 3.5–5.1)
Sodium: 138 mmol/L (ref 135–145)

## 2020-12-03 LAB — CULTURE, BLOOD (ROUTINE X 2)
Culture: NO GROWTH
Culture: NO GROWTH
Special Requests: ADEQUATE
Special Requests: ADEQUATE

## 2020-12-03 LAB — CBC
HCT: 25.8 % — ABNORMAL LOW (ref 36.0–46.0)
Hemoglobin: 8 g/dL — ABNORMAL LOW (ref 12.0–15.0)
MCH: 29.5 pg (ref 26.0–34.0)
MCHC: 31 g/dL (ref 30.0–36.0)
MCV: 95.2 fL (ref 80.0–100.0)
Platelets: 65 10*3/uL — ABNORMAL LOW (ref 150–400)
RBC: 2.71 MIL/uL — ABNORMAL LOW (ref 3.87–5.11)
RDW: 20.3 % — ABNORMAL HIGH (ref 11.5–15.5)
WBC: 8.6 10*3/uL (ref 4.0–10.5)
nRBC: 0.7 % — ABNORMAL HIGH (ref 0.0–0.2)

## 2020-12-03 NOTE — Progress Notes (Signed)
[facility]  ROUNDING NOTE   Subjective:  Right IJ temporary dialysis catheter placed. Appreciate vascular interventional radiology assistance. Due for dialysis treatment again today.   Objective:  Vital signs in last 24 hours:  Temperature 97 pulse 70 respirations 20 blood pressure 139/94   Physical Exam: General:  No acute distress  Head:  Normocephalic, atraumatic. Moist oral mucosal membranes  Eyes:  Anicteric  Neck:  Supple  Lungs:   Basilar rales, normal effort  Heart:  S1S2 no rubs  Abdomen:   Soft, nontender, bowel sounds present  Extremities:  No lower extremity edema  Neurologic:  Awake, alert, follows simple commands  Skin:  No acute rash  Access:  Right IJ temporary dialysis catheter in place    Basic Metabolic Panel: Recent Labs  Lab 11/28/20 0423 11/30/20 0619 12/03/20 0446  NA 136 140 138  K 4.2 4.5 3.8  CL 101 102 101  CO2 26 21* 22  GLUCOSE 128* 175* 172*  BUN 53* 74* 84*  CREATININE 3.91* 5.04* 4.24*  CALCIUM 8.6* 8.7* 9.0  PHOS 3.3 4.3 3.3    Liver Function Tests: Recent Labs  Lab 11/28/20 0423 11/30/20 0619 12/03/20 0446  ALBUMIN 2.4* 2.4* 2.4*   No results for input(s): LIPASE, AMYLASE in the last 168 hours. Recent Labs  Lab 11/27/20 0121 11/29/20 0523  AMMONIA 45* 41*    CBC: Recent Labs  Lab 11/27/20 0121 11/28/20 0423 11/29/20 0523 11/30/20 0619 12/03/20 0446  WBC 27.6* 25.9* 18.0* 11.0* 8.6  HGB 8.4* 9.5* 9.2* 9.0* 8.0*  HCT 27.9* 32.4* 31.2* 29.2* 25.8*  MCV 98.9 100.0 102.3* 96.7 95.2  PLT 150 144* 89* 96* 65*    Cardiac Enzymes: No results for input(s): CKTOTAL, CKMB, CKMBINDEX, TROPONINI in the last 168 hours.  BNP: Invalid input(s): POCBNP  CBG: No results for input(s): GLUCAP in the last 168 hours.  Microbiology: Results for orders placed or performed during the hospital encounter of 10/16/20  Culture, blood (routine x 2)     Status: None   Collection Time: 10/31/20  8:54 AM    Specimen: BLOOD LEFT HAND  Result Value Ref Range Status   Specimen Description BLOOD LEFT HAND  Final   Special Requests   Final    BOTTLES DRAWN AEROBIC AND ANAEROBIC Blood Culture results may not be optimal due to an inadequate volume of blood received in culture bottles   Culture   Final    NO GROWTH 5 DAYS Performed at [facility], [place] [address], [place], [place] [address]    Report Status 11/05/2020 FINAL  Final  Culture, blood (routine x 2)     Status: None   Collection Time: 10/31/20  9:48 AM   Specimen: BLOOD LEFT HAND  Result Value Ref Range Status   Specimen Description BLOOD LEFT HAND  Final   Special Requests   Final    BOTTLES DRAWN AEROBIC ONLY Blood Culture adequate volume   Culture   Final    NO GROWTH 5 DAYS Performed at [facility], [place] [address] [place], [place] [address]    Report Status 11/05/2020 FINAL  Final  Expectorated Sputum Assessment w Gram Stain, Rflx to Resp Cult     Status: None   Collection Time: 11/26/20  8:37 AM   Specimen: Sputum  Result Value Ref Range Status   Specimen Description SPUTUM  Final   Special Requests NONE  Final   Sputum evaluation   Final    THIS  SPECIMEN IS ACCEPTABLE FOR SPUTUM CULTURE Performed at [facility], [place] [address], [place], [place] [address]    Report Status 11/27/2020 FINAL  Final  Culture, Respiratory w Gram Stain     Status: None   Collection Time: 11/26/20  8:37 AM   Specimen: SPU  Result Value Ref Range Status   Specimen Description SPUTUM  Final   Special Requests NONE Reflexed from M2512  Final   Gram Stain   Final    ABUNDANT WBC PRESENT,BOTH PMN AND MONONUCLEAR FEW GRAM VARIABLE ROD RARE GRAM POSITIVE COCCI Performed at [facility], [place] [address], [place], [place] [address]    Culture RARE KLEBSIELLA PNEUMONIAE  Final   Report Status 11/29/2020 FINAL  Final   Organism ID, Bacteria KLEBSIELLA PNEUMONIAE  Final      Susceptibility   Klebsiella pneumoniae - MIC*     AMPICILLIN >=32 RESISTANT Resistant     CEFAZOLIN <=4 SENSITIVE Sensitive     CEFEPIME <=0.12 SENSITIVE Sensitive     CEFTAZIDIME <=1 SENSITIVE Sensitive     CEFTRIAXONE <=0.25 SENSITIVE Sensitive     CIPROFLOXACIN <=0.25 SENSITIVE Sensitive     GENTAMICIN <=1 SENSITIVE Sensitive     IMIPENEM <=0.25 SENSITIVE Sensitive     TRIMETH/SULFA <=20 SENSITIVE Sensitive     AMPICILLIN/SULBACTAM 4 SENSITIVE Sensitive     PIP/TAZO <=4 SENSITIVE Sensitive     * RARE KLEBSIELLA PNEUMONIAE  Culture, blood (routine x 2)     Status: Abnormal   Collection Time: 11/26/20 10:21 AM   Specimen: BLOOD LEFT HAND  Result Value Ref Range Status   Specimen Description BLOOD LEFT HAND  Final   Special Requests   Final    BOTTLES DRAWN AEROBIC AND ANAEROBIC Blood Culture adequate volume   Culture  Setup Time   Final    AEROBIC BOTTLE ONLY GRAM NEGATIVE RODS CRITICAL RESULT CALLED TO, READ BACK BY AND VERIFIED WITH: RN D [place] [id number] AT 0717 AM BY CM Performed at [facility], [address], [place], [place] [address]    Culture KLEBSIELLA PNEUMONIAE (A)  Final   Report Status 11/29/2020 FINAL  Final   Organism ID, Bacteria KLEBSIELLA PNEUMONIAE  Final      Susceptibility   Klebsiella pneumoniae - MIC*    AMPICILLIN >=32 RESISTANT Resistant     CEFAZOLIN <=4 SENSITIVE Sensitive     CEFEPIME <=0.12 SENSITIVE Sensitive     CEFTAZIDIME <=1 SENSITIVE Sensitive     CEFTRIAXONE <=0.25 SENSITIVE Sensitive     CIPROFLOXACIN <=0.25 SENSITIVE Sensitive     GENTAMICIN <=1 SENSITIVE Sensitive     IMIPENEM <=0.25 SENSITIVE Sensitive     TRIMETH/SULFA <=20 SENSITIVE Sensitive     AMPICILLIN/SULBACTAM 4 SENSITIVE Sensitive     PIP/TAZO <=4 SENSITIVE Sensitive     * KLEBSIELLA PNEUMONIAE  Blood Culture ID Panel (Reflexed)     Status: Abnormal   Collection Time: 11/26/20 10:21 AM  Result Value Ref Range Status   Enterococcus faecalis NOT DETECTED NOT DETECTED Final   Enterococcus Faecium NOT DETECTED  NOT DETECTED Final   Listeria monocytogenes NOT DETECTED NOT DETECTED Final   Staphylococcus species NOT DETECTED NOT DETECTED Final   Staphylococcus aureus (BCID) NOT DETECTED NOT DETECTED Final   Staphylococcus epidermidis NOT DETECTED NOT DETECTED Final   Staphylococcus lugdunensis NOT DETECTED NOT DETECTED Final   Streptococcus species NOT DETECTED NOT DETECTED Final   Streptococcus agalactiae NOT DETECTED NOT DETECTED Final   Streptococcus pneumoniae NOT  DETECTED NOT DETECTED Final   Streptococcus pyogenes NOT DETECTED NOT DETECTED Final   A.calcoaceticus-baumannii NOT DETECTED NOT DETECTED Final   Bacteroides fragilis NOT DETECTED NOT DETECTED Final   Enterobacterales DETECTED (A) NOT DETECTED Final    Comment: Enterobacterales represent a large order of gram negative bacteria, not a single organism. CRITICAL RESULT CALLED TO, READ BACK BY AND VERIFIED WITH: RN D [place] [id number] AT 0174 AM BY CM    Enterobacter cloacae complex NOT DETECTED NOT DETECTED Final   Escherichia coli NOT DETECTED NOT DETECTED Final   Klebsiella aerogenes NOT DETECTED NOT DETECTED Final   Klebsiella oxytoca NOT DETECTED NOT DETECTED Final   Klebsiella pneumoniae DETECTED (A) NOT DETECTED Final    Comment: CRITICAL RESULT CALLED TO, READ BACK BY AND VERIFIED WITH: RN D [place] [id number] AT 0717 AM BY CM    Proteus species NOT DETECTED NOT DETECTED Final   Salmonella species NOT DETECTED NOT DETECTED Final   Serratia marcescens NOT DETECTED NOT DETECTED Final   Haemophilus influenzae NOT DETECTED NOT DETECTED Final   Neisseria meningitidis NOT DETECTED NOT DETECTED Final   Pseudomonas aeruginosa NOT DETECTED NOT DETECTED Final   Stenotrophomonas maltophilia NOT DETECTED NOT DETECTED Final   Candida albicans NOT DETECTED NOT DETECTED Final   Candida auris NOT DETECTED NOT DETECTED Final   Candida glabrata NOT DETECTED NOT DETECTED Final   Candida krusei NOT DETECTED NOT DETECTED Final   Candida  parapsilosis NOT DETECTED NOT DETECTED Final   Candida tropicalis NOT DETECTED NOT DETECTED Final   Cryptococcus neoformans/gattii NOT DETECTED NOT DETECTED Final   CTX-M ESBL NOT DETECTED NOT DETECTED Final   Carbapenem resistance IMP NOT DETECTED NOT DETECTED Final   Carbapenem resistance KPC NOT DETECTED NOT DETECTED Final   Carbapenem resistance NDM NOT DETECTED NOT DETECTED Final   Carbapenem resist OXA 48 LIKE NOT DETECTED NOT DETECTED Final   Carbapenem resistance VIM NOT DETECTED NOT DETECTED Final    Comment: Performed at [facility], [address], [place], [place] [address]  Culture, blood (routine x 2)     Status: None   Collection Time: 11/26/20  5:00 PM   Specimen: BLOOD LEFT HAND  Result Value Ref Range Status   Specimen Description BLOOD LEFT HAND  Final   Special Requests   Final    BOTTLES DRAWN AEROBIC AND ANAEROBIC Blood Culture results may not be optimal due to an inadequate volume of blood received in culture bottles   Culture   Final    NO GROWTH 5 DAYS Performed at [facility], [place] [address], [place], [place] [address]    Report Status 12/01/2020 FINAL  Final  Culture, blood (routine x 2)     Status: None   Collection Time: 11/28/20 12:16 PM   Specimen: BLOOD RIGHT HAND  Result Value Ref Range Status   Specimen Description BLOOD RIGHT HAND  Final   Special Requests   Final    BOTTLES DRAWN AEROBIC ONLY Blood Culture adequate volume   Culture   Final    NO GROWTH 5 DAYS Performed at [facility], [place] [address], [place], [place] [address]    Report Status 12/03/2020 FINAL  Final  Culture, blood (routine x 2)     Status: None   Collection Time: 11/28/20 12:22 PM   Specimen: BLOOD RIGHT FOREARM  Result Value Ref Range Status   Specimen Description BLOOD RIGHT FOREARM  Final   Special Requests   Final  BOTTLES DRAWN AEROBIC ONLY Blood Culture adequate volume   Culture   Final    NO GROWTH 5 DAYS Performed at [place], [place] [address] [place], [place] [address]    Report Status 12/03/2020 FINAL  Final    Coagulation Studies: No results for input(s): LABPROT, INR in the last 72 hours.  Urinalysis: No results for input(s): COLORURINE, LABSPEC, PHURINE, GLUCOSEU, HGBUR, BILIRUBINUR, KETONESUR, PROTEINUR, UROBILINOGEN, NITRITE, LEUKOCYTESUR in the last 72 hours.  Invalid input(s): APPERANCEUR    Imaging: No results found.   Medications:       Assessment/ Plan:  60 y.o. 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   1.  ESRD on HD.  Patient underwent hemodialysis on Friday.  She will undergo dialysis treatment again today.  2.  Anemia of chronic kidney disease.   Lab Results  Component Value Date   HGB 8.0 (L) 12/03/2020   Hemoglobin down to 8.0.  Maintain the patient on Retacrit 10,000 units IV with dialysis..  3.  Secondary hyperparathyroidism.  Continue to periodically monitor serum phosphorus.  4.  Thrombocytopenia.  Platelets continue to fluctuate and currently down to 65,000.  Evaluation per hospitalist.  5.  Hyponatremia.  Sodium remains within target range and currently 138.  6.  Leukocytosis/fever/sepsis.  Klebsiella growing in the blood.  Appreciate input from infectious disease.  Recommended that cefepime be continued.   LOS: 0 Kelia Gibbon 4/11/20228:25 AM

## 2020-12-04 LAB — CBC
HCT: 27.9 % — ABNORMAL LOW (ref 36.0–46.0)
Hemoglobin: 8.7 g/dL — ABNORMAL LOW (ref 12.0–15.0)
MCH: 29.7 pg (ref 26.0–34.0)
MCHC: 31.2 g/dL (ref 30.0–36.0)
MCV: 95.2 fL (ref 80.0–100.0)
Platelets: 51 10*3/uL — ABNORMAL LOW (ref 150–400)
RBC: 2.93 MIL/uL — ABNORMAL LOW (ref 3.87–5.11)
RDW: 20.1 % — ABNORMAL HIGH (ref 11.5–15.5)
WBC: 9.2 10*3/uL (ref 4.0–10.5)
nRBC: 0.8 % — ABNORMAL HIGH (ref 0.0–0.2)

## 2020-12-05 LAB — RENAL FUNCTION PANEL
Albumin: 2.4 g/dL — ABNORMAL LOW (ref 3.5–5.0)
Anion gap: 11 (ref 5–15)
BUN: 59 mg/dL — ABNORMAL HIGH (ref 6–20)
CO2: 22 mmol/L (ref 22–32)
Calcium: 9 mg/dL (ref 8.9–10.3)
Chloride: 101 mmol/L (ref 98–111)
Creatinine, Ser: 3.51 mg/dL — ABNORMAL HIGH (ref 0.44–1.00)
GFR, Estimated: 15 mL/min — ABNORMAL LOW (ref >60)
Glucose, Bld: 199 mg/dL — ABNORMAL HIGH (ref 70–99)
Phosphorus: 1.9 mg/dL — ABNORMAL LOW (ref 2.5–4.6)
Potassium: 3.4 mmol/L — ABNORMAL LOW (ref 3.5–5.1)
Sodium: 134 mmol/L — ABNORMAL LOW (ref 135–145)

## 2020-12-05 LAB — CBC
HCT: 26.4 % — ABNORMAL LOW (ref 36.0–46.0)
Hemoglobin: 8 g/dL — ABNORMAL LOW (ref 12.0–15.0)
MCH: 29.4 pg (ref 26.0–34.0)
MCHC: 30.3 g/dL (ref 30.0–36.0)
MCV: 97.1 fL (ref 80.0–100.0)
Platelets: 62 10*3/uL — ABNORMAL LOW (ref 150–400)
RBC: 2.72 MIL/uL — ABNORMAL LOW (ref 3.87–5.11)
RDW: 20.4 % — ABNORMAL HIGH (ref 11.5–15.5)
WBC: 11.8 10*3/uL — ABNORMAL HIGH (ref 4.0–10.5)
nRBC: 0.3 % — ABNORMAL HIGH (ref 0.0–0.2)

## 2020-12-05 LAB — ECHOCARDIOGRAM COMPLETE: S' Lateral: 2.7 cm

## 2020-12-05 NOTE — Progress Notes (Signed)
[facility]  ROUNDING NOTE   Subjective:  Patient seen and evaluated during hemodialysis treatment today. Ultrafiltration target 1.5 kg. Tolerating well.   Objective:  Vital signs in last 24 hours:  Temperature 97 pulse 72 respirations 15 blood pressure 156/81   Physical Exam: General:  No acute distress  Head:  Normocephalic, atraumatic. Moist oral mucosal membranes  Eyes:  Anicteric  Neck:  Supple  Lungs:   Clear bilateral, normal effort  Heart:  S1S2 no rubs  Abdomen:   Soft, nontender, bowel sounds present  Extremities:  No lower extremity edema  Neurologic:  Awake, alert, follows simple commands  Skin:  No acute rash  Access:  Right IJ temporary dialysis catheter in place    Basic Metabolic Panel: Recent Labs  Lab 11/30/20 0619 12/03/20 0446  NA 140 138  K 4.5 3.8  CL 102 101  CO2 21* 22  GLUCOSE 175* 172*  BUN 74* 84*  CREATININE 5.04* 4.24*  CALCIUM 8.7* 9.0  PHOS 4.3 3.3    Liver Function Tests: Recent Labs  Lab 11/30/20 0619 12/03/20 0446  ALBUMIN 2.4* 2.4*   No results for input(s): LIPASE, AMYLASE in the last 168 hours. Recent Labs  Lab 11/29/20 0523  AMMONIA 41*    CBC: Recent Labs  Lab 11/29/20 0523 11/30/20 0619 12/03/20 0446 12/04/20 0539 12/05/20 0632  WBC 18.0* 11.0* 8.6 9.2 11.8*  HGB 9.2* 9.0* 8.0* 8.7* 8.0*  HCT 31.2* 29.2* 25.8* 27.9* 26.4*  MCV 102.3* 96.7 95.2 95.2 97.1  PLT 89* 96* 65* 51* 62*    Cardiac Enzymes: No results for input(s): CKTOTAL, CKMB, CKMBINDEX, TROPONINI in the last 168 hours.  BNP: Invalid input(s): POCBNP  CBG: No results for input(s): GLUCAP in the last 168 hours.  Microbiology: Results for orders placed or performed during the hospital encounter of 10/16/20  Culture, blood (routine x 2)     Status: None   Collection Time: 10/31/20  8:54 AM   Specimen: BLOOD LEFT HAND  Result Value Ref Range Status   Specimen Description BLOOD LEFT HAND  Final   Special Requests   Final     BOTTLES DRAWN AEROBIC AND ANAEROBIC Blood Culture results may not be optimal due to an inadequate volume of blood received in culture bottles   Culture   Final    NO GROWTH 5 DAYS Performed at [facility], [place] [address] [place], [place] [address]    Report Status 11/05/2020 FINAL  Final  Culture, blood (routine x 2)     Status: None   Collection Time: 10/31/20  9:48 AM   Specimen: BLOOD LEFT HAND  Result Value Ref Range Status   Specimen Description BLOOD LEFT HAND  Final   Special Requests   Final    BOTTLES DRAWN AEROBIC ONLY Blood Culture adequate volume   Culture   Final    NO GROWTH 5 DAYS Performed at [facility], [place] [address], [place], [place] [address]    Report Status 11/05/2020 FINAL  Final  Expectorated Sputum Assessment w Gram Stain, Rflx to Resp Cult     Status: None   Collection Time: 11/26/20  8:37 AM   Specimen: Sputum  Result Value Ref Range Status   Specimen Description SPUTUM  Final   Special Requests NONE  Final   Sputum evaluation   Final    THIS SPECIMEN IS ACCEPTABLE FOR SPUTUM CULTURE Performed at [facility], [place] [address], [place], [place] [address]  Report Status 11/27/2020 FINAL  Final  Culture, Respiratory w Gram Stain     Status: None   Collection Time: 11/26/20  8:37 AM   Specimen: SPU  Result Value Ref Range Status   Specimen Description SPUTUM  Final   Special Requests NONE Reflexed from M2512  Final   Gram Stain   Final    ABUNDANT WBC PRESENT,BOTH PMN AND MONONUCLEAR FEW GRAM VARIABLE ROD RARE GRAM POSITIVE COCCI Performed at [facility], [place] [address], [place], [place] [address]    Culture RARE KLEBSIELLA PNEUMONIAE  Final   Report Status 11/29/2020 FINAL  Final   Organism ID, Bacteria KLEBSIELLA PNEUMONIAE  Final      Susceptibility   Klebsiella pneumoniae - MIC*    AMPICILLIN >=32 RESISTANT Resistant     CEFAZOLIN <=4 SENSITIVE Sensitive     CEFEPIME <=0.12 SENSITIVE Sensitive      CEFTAZIDIME <=1 SENSITIVE Sensitive     CEFTRIAXONE <=0.25 SENSITIVE Sensitive     CIPROFLOXACIN <=0.25 SENSITIVE Sensitive     GENTAMICIN <=1 SENSITIVE Sensitive     IMIPENEM <=0.25 SENSITIVE Sensitive     TRIMETH/SULFA <=20 SENSITIVE Sensitive     AMPICILLIN/SULBACTAM 4 SENSITIVE Sensitive     PIP/TAZO <=4 SENSITIVE Sensitive     * RARE KLEBSIELLA PNEUMONIAE  Culture, blood (routine x 2)     Status: Abnormal   Collection Time: 11/26/20 10:21 AM   Specimen: BLOOD LEFT HAND  Result Value Ref Range Status   Specimen Description BLOOD LEFT HAND  Final   Special Requests   Final    BOTTLES DRAWN AEROBIC AND ANAEROBIC Blood Culture adequate volume   Culture  Setup Time   Final    AEROBIC BOTTLE ONLY GRAM NEGATIVE RODS CRITICAL RESULT CALLED TO, READ BACK BY AND VERIFIED WITH: RN D [place] [id number] AT 0717 AM BY CM Performed at [facility], [address], [place], [place] [address]    Culture KLEBSIELLA PNEUMONIAE (A)  Final   Report Status 11/29/2020 FINAL  Final   Organism ID, Bacteria KLEBSIELLA PNEUMONIAE  Final      Susceptibility   Klebsiella pneumoniae - MIC*    AMPICILLIN >=32 RESISTANT Resistant     CEFAZOLIN <=4 SENSITIVE Sensitive     CEFEPIME <=0.12 SENSITIVE Sensitive     CEFTAZIDIME <=1 SENSITIVE Sensitive     CEFTRIAXONE <=0.25 SENSITIVE Sensitive     CIPROFLOXACIN <=0.25 SENSITIVE Sensitive     GENTAMICIN <=1 SENSITIVE Sensitive     IMIPENEM <=0.25 SENSITIVE Sensitive     TRIMETH/SULFA <=20 SENSITIVE Sensitive     AMPICILLIN/SULBACTAM 4 SENSITIVE Sensitive     PIP/TAZO <=4 SENSITIVE Sensitive     * KLEBSIELLA PNEUMONIAE  Blood Culture ID Panel (Reflexed)     Status: Abnormal   Collection Time: 11/26/20 10:21 AM  Result Value Ref Range Status   Enterococcus faecalis NOT DETECTED NOT DETECTED Final   Enterococcus Faecium NOT DETECTED NOT DETECTED Final   Listeria monocytogenes NOT DETECTED NOT DETECTED Final   Staphylococcus species NOT DETECTED NOT  DETECTED Final   Staphylococcus aureus (BCID) NOT DETECTED NOT DETECTED Final   Staphylococcus epidermidis NOT DETECTED NOT DETECTED Final   Staphylococcus lugdunensis NOT DETECTED NOT DETECTED Final   Streptococcus species NOT DETECTED NOT DETECTED Final   Streptococcus agalactiae NOT DETECTED NOT DETECTED Final   Streptococcus pneumoniae NOT DETECTED NOT DETECTED Final   Streptococcus pyogenes NOT DETECTED NOT DETECTED Final   A.calcoaceticus-baumannii NOT DETECTED NOT DETECTED Final  Bacteroides fragilis NOT DETECTED NOT DETECTED Final   Enterobacterales DETECTED (A) NOT DETECTED Final    Comment: Enterobacterales represent a large order of gram negative bacteria, not a single organism. CRITICAL RESULT CALLED TO, READ BACK BY AND VERIFIED WITH: RN D [place] [id number] AT 0454 AM BY CM    Enterobacter cloacae complex NOT DETECTED NOT DETECTED Final   Escherichia coli NOT DETECTED NOT DETECTED Final   Klebsiella aerogenes NOT DETECTED NOT DETECTED Final   Klebsiella oxytoca NOT DETECTED NOT DETECTED Final   Klebsiella pneumoniae DETECTED (A) NOT DETECTED Final    Comment: CRITICAL RESULT CALLED TO, READ BACK BY AND VERIFIED WITH: RN D [place] [id number] AT 0717 AM BY CM    Proteus species NOT DETECTED NOT DETECTED Final   Salmonella species NOT DETECTED NOT DETECTED Final   Serratia marcescens NOT DETECTED NOT DETECTED Final   Haemophilus influenzae NOT DETECTED NOT DETECTED Final   Neisseria meningitidis NOT DETECTED NOT DETECTED Final   Pseudomonas aeruginosa NOT DETECTED NOT DETECTED Final   Stenotrophomonas maltophilia NOT DETECTED NOT DETECTED Final   Candida albicans NOT DETECTED NOT DETECTED Final   Candida auris NOT DETECTED NOT DETECTED Final   Candida glabrata NOT DETECTED NOT DETECTED Final   Candida krusei NOT DETECTED NOT DETECTED Final   Candida parapsilosis NOT DETECTED NOT DETECTED Final   Candida tropicalis NOT DETECTED NOT DETECTED Final   Cryptococcus  neoformans/gattii NOT DETECTED NOT DETECTED Final   CTX-M ESBL NOT DETECTED NOT DETECTED Final   Carbapenem resistance IMP NOT DETECTED NOT DETECTED Final   Carbapenem resistance KPC NOT DETECTED NOT DETECTED Final   Carbapenem resistance NDM NOT DETECTED NOT DETECTED Final   Carbapenem resist OXA 48 LIKE NOT DETECTED NOT DETECTED Final   Carbapenem resistance VIM NOT DETECTED NOT DETECTED Final    Comment: Performed at [facility], [address], [place], [place] [address]  Culture, blood (routine x 2)     Status: None   Collection Time: 11/26/20  5:00 PM   Specimen: BLOOD LEFT HAND  Result Value Ref Range Status   Specimen Description BLOOD LEFT HAND  Final   Special Requests   Final    BOTTLES DRAWN AEROBIC AND ANAEROBIC Blood Culture results may not be optimal due to an inadequate volume of blood received in culture bottles   Culture   Final    NO GROWTH 5 DAYS Performed at [facility], [place] [address] [place], [place] [address]    Report Status 12/01/2020 FINAL  Final  Culture, blood (routine x 2)     Status: None   Collection Time: 11/28/20 12:16 PM   Specimen: BLOOD RIGHT HAND  Result Value Ref Range Status   Specimen Description BLOOD RIGHT HAND  Final   Special Requests   Final    BOTTLES DRAWN AEROBIC ONLY Blood Culture adequate volume   Culture   Final    NO GROWTH 5 DAYS Performed at [facility], [place] [address] [place], [place] [address]    Report Status 12/03/2020 FINAL  Final  Culture, blood (routine x 2)     Status: None   Collection Time: 11/28/20 12:22 PM   Specimen: BLOOD RIGHT FOREARM  Result Value Ref Range Status   Specimen Description BLOOD RIGHT FOREARM  Final   Special Requests   Final    BOTTLES DRAWN AEROBIC ONLY Blood Culture adequate volume   Culture   Final    NO GROWTH 5 DAYS  Performed at [facility], [place] [address], [place], [place] [address]    Report Status 12/03/2020 FINAL  Final    Coagulation Studies: No  results for input(s): LABPROT, INR in the last 72 hours.  Urinalysis: No results for input(s): COLORURINE, LABSPEC, PHURINE, GLUCOSEU, HGBUR, BILIRUBINUR, KETONESUR, PROTEINUR, UROBILINOGEN, NITRITE, LEUKOCYTESUR in the last 72 hours.  Invalid input(s): APPERANCEUR    Imaging: No results found.   Medications:       Assessment/ Plan:  60 y.o. 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   1.  ESRD on HD.  Patient seen and evaluated during hemodialysis treatment today.  Ultrafiltration target 1.5 kg.  Tolerating treatment well..  2.  Anemia of chronic kidney disease.   Lab Results  Component Value Date   HGB 8.0 (L) 12/05/2020   Continue Retacrit 10,000 units IV with dialysis.  3.  Secondary hyperparathyroidism.  Phosphorus 3.3 and at target.  4.  Thrombocytopenia.  Platelets remain low at 62,000.  Evaluation management per hospitalist.  5.  Hyponatremia.  Most recent serum sodium was normal at 138.  Continue to periodically monitor.  6.  Leukocytosis/fever/sepsis.  Klebsiella noted on blood cultures appreciate input from infectious disease.  Recommended that cefepime be continued.   LOS: 0 Haldon Carley 4/13/20228:00 AM

## 2020-12-07 LAB — CBC
HCT: 25 % — ABNORMAL LOW (ref 36.0–46.0)
Hemoglobin: 7.8 g/dL — ABNORMAL LOW (ref 12.0–15.0)
MCH: 30 pg (ref 26.0–34.0)
MCHC: 31.2 g/dL (ref 30.0–36.0)
MCV: 96.2 fL (ref 80.0–100.0)
Platelets: 135 10*3/uL — ABNORMAL LOW (ref 150–400)
RBC: 2.6 MIL/uL — ABNORMAL LOW (ref 3.87–5.11)
RDW: 20.4 % — ABNORMAL HIGH (ref 11.5–15.5)
WBC: 12.2 10*3/uL — ABNORMAL HIGH (ref 4.0–10.5)
nRBC: 0.2 % (ref 0.0–0.2)

## 2020-12-07 LAB — MAGNESIUM: Magnesium: 1.9 mg/dL (ref 1.7–2.4)

## 2020-12-07 LAB — RENAL FUNCTION PANEL
Albumin: 2.6 g/dL — ABNORMAL LOW (ref 3.5–5.0)
Anion gap: 9 (ref 5–15)
BUN: 42 mg/dL — ABNORMAL HIGH (ref 6–20)
CO2: 25 mmol/L (ref 22–32)
Calcium: 8.6 mg/dL — ABNORMAL LOW (ref 8.9–10.3)
Chloride: 96 mmol/L — ABNORMAL LOW (ref 98–111)
Creatinine, Ser: 3.18 mg/dL — ABNORMAL HIGH (ref 0.44–1.00)
GFR, Estimated: 16 mL/min — ABNORMAL LOW (ref >60)
Glucose, Bld: 159 mg/dL — ABNORMAL HIGH (ref 70–99)
Phosphorus: 2.4 mg/dL — ABNORMAL LOW (ref 2.5–4.6)
Potassium: 3.9 mmol/L (ref 3.5–5.1)
Sodium: 130 mmol/L — ABNORMAL LOW (ref 135–145)

## 2020-12-07 LAB — AMMONIA: Ammonia: 52 umol/L — ABNORMAL HIGH (ref 9–35)

## 2020-12-07 NOTE — Progress Notes (Signed)
[facility]  ROUNDING NOTE   Subjective:  Patient resting comfortably this AM. Due for hemodialysis later today.   Objective:  Vital signs in last 24 hours:  Temperature 97.3 pulse 78 respirations 20 blood pressure 173/78   Physical Exam: General:  No acute distress  Head:  Normocephalic, atraumatic. Moist oral mucosal membranes  Eyes:  Anicteric  Neck:  Supple  Lungs:   Clear bilateral, normal effort  Heart:  S1S2 no rubs  Abdomen:   Soft, nontender, bowel sounds present  Extremities:  No lower extremity edema  Neurologic:  Awake, alert, follows simple commands  Skin:  No acute rash  Access:  Right IJ temporary dialysis catheter in place    Basic Metabolic Panel: Recent Labs  Lab 12/03/20 0446 12/05/20 0632 12/07/20 0443  NA 138 134* 130*  K 3.8 3.4* 3.9  CL 101 101 96*  CO2 22 22 25   GLUCOSE 172* 199* 159*  BUN 84* 59* 42*  CREATININE 4.24* 3.51* 3.18*  CALCIUM 9.0 9.0 8.6*  MG  --   --  1.9  PHOS 3.3 1.9* 2.4*    Liver Function Tests: Recent Labs  Lab 12/03/20 0446 12/05/20 0632 12/07/20 0443  ALBUMIN 2.4* 2.4* 2.6*   No results for input(s): LIPASE, AMYLASE in the last 168 hours. Recent Labs  Lab 12/07/20 0443  AMMONIA 52*    CBC: Recent Labs  Lab 12/03/20 0446 12/04/20 0539 12/05/20 0632 12/07/20 0443  WBC 8.6 9.2 11.8* 12.2*  HGB 8.0* 8.7* 8.0* 7.8*  HCT 25.8* 27.9* 26.4* 25.0*  MCV 95.2 95.2 97.1 96.2  PLT 65* 51* 62* 135*    Cardiac Enzymes: No results for input(s): CKTOTAL, CKMB, CKMBINDEX, TROPONINI in the last 168 hours.  BNP: Invalid input(s): POCBNP  CBG: No results for input(s): GLUCAP in the last 168 hours.  Microbiology: Results for orders placed or performed during the hospital encounter of 10/16/20  Culture, blood (routine x 2)     Status: None   Collection Time: 10/31/20  8:54 AM   Specimen: BLOOD LEFT HAND  Result Value Ref Range Status   Specimen Description BLOOD LEFT HAND  Final   Special  Requests   Final    BOTTLES DRAWN AEROBIC AND ANAEROBIC Blood Culture results may not be optimal due to an inadequate volume of blood received in culture bottles   Culture   Final    NO GROWTH 5 DAYS Performed at [facility], [place] [address] [place], [place] [address]    Report Status 11/05/2020 FINAL  Final  Culture, blood (routine x 2)     Status: None   Collection Time: 10/31/20  9:48 AM   Specimen: BLOOD LEFT HAND  Result Value Ref Range Status   Specimen Description BLOOD LEFT HAND  Final   Special Requests   Final    BOTTLES DRAWN AEROBIC ONLY Blood Culture adequate volume   Culture   Final    NO GROWTH 5 DAYS Performed at [facility], [place] [address] [place], [place] [address]    Report Status 11/05/2020 FINAL  Final  Expectorated Sputum Assessment w Gram Stain, Rflx to Resp Cult     Status: None   Collection Time: 11/26/20  8:37 AM   Specimen: Sputum  Result Value Ref Range Status   Specimen Description SPUTUM  Final   Special Requests NONE  Final   Sputum evaluation   Final    THIS SPECIMEN IS ACCEPTABLE FOR SPUTUM CULTURE Performed at  [facility], [place] [address] [place], [place] [address]    Report Status 11/27/2020 FINAL  Final  Culture, Respiratory w Gram Stain     Status: None   Collection Time: 11/26/20  8:37 AM   Specimen: SPU  Result Value Ref Range Status   Specimen Description SPUTUM  Final   Special Requests NONE Reflexed from M2512  Final   Gram Stain   Final    ABUNDANT WBC PRESENT,BOTH PMN AND MONONUCLEAR FEW GRAM VARIABLE ROD RARE GRAM POSITIVE COCCI Performed at [facility], [place] [address], [place], [place] [address]    Culture RARE KLEBSIELLA PNEUMONIAE  Final   Report Status 11/29/2020 FINAL  Final   Organism ID, Bacteria KLEBSIELLA PNEUMONIAE  Final      Susceptibility   Klebsiella pneumoniae - MIC*    AMPICILLIN >=32 RESISTANT Resistant     CEFAZOLIN <=4 SENSITIVE Sensitive     CEFEPIME <=0.12 SENSITIVE  Sensitive     CEFTAZIDIME <=1 SENSITIVE Sensitive     CEFTRIAXONE <=0.25 SENSITIVE Sensitive     CIPROFLOXACIN <=0.25 SENSITIVE Sensitive     GENTAMICIN <=1 SENSITIVE Sensitive     IMIPENEM <=0.25 SENSITIVE Sensitive     TRIMETH/SULFA <=20 SENSITIVE Sensitive     AMPICILLIN/SULBACTAM 4 SENSITIVE Sensitive     PIP/TAZO <=4 SENSITIVE Sensitive     * RARE KLEBSIELLA PNEUMONIAE  Culture, blood (routine x 2)     Status: Abnormal   Collection Time: 11/26/20 10:21 AM   Specimen: BLOOD LEFT HAND  Result Value Ref Range Status   Specimen Description BLOOD LEFT HAND  Final   Special Requests   Final    BOTTLES DRAWN AEROBIC AND ANAEROBIC Blood Culture adequate volume   Culture  Setup Time   Final    AEROBIC BOTTLE ONLY GRAM NEGATIVE RODS CRITICAL RESULT CALLED TO, READ BACK BY AND VERIFIED WITH: RN D [place] [id number] AT 0717 AM BY CM Performed at [facility], [address] [place], [place] [address]    Culture KLEBSIELLA PNEUMONIAE (A)  Final   Report Status 11/29/2020 FINAL  Final   Organism ID, Bacteria KLEBSIELLA PNEUMONIAE  Final      Susceptibility   Klebsiella pneumoniae - MIC*    AMPICILLIN >=32 RESISTANT Resistant     CEFAZOLIN <=4 SENSITIVE Sensitive     CEFEPIME <=0.12 SENSITIVE Sensitive     CEFTAZIDIME <=1 SENSITIVE Sensitive     CEFTRIAXONE <=0.25 SENSITIVE Sensitive     CIPROFLOXACIN <=0.25 SENSITIVE Sensitive     GENTAMICIN <=1 SENSITIVE Sensitive     IMIPENEM <=0.25 SENSITIVE Sensitive     TRIMETH/SULFA <=20 SENSITIVE Sensitive     AMPICILLIN/SULBACTAM 4 SENSITIVE Sensitive     PIP/TAZO <=4 SENSITIVE Sensitive     * KLEBSIELLA PNEUMONIAE  Blood Culture ID Panel (Reflexed)     Status: Abnormal   Collection Time: 11/26/20 10:21 AM  Result Value Ref Range Status   Enterococcus faecalis NOT DETECTED NOT DETECTED Final   Enterococcus Faecium NOT DETECTED NOT DETECTED Final   Listeria monocytogenes NOT DETECTED NOT DETECTED Final   Staphylococcus species NOT  DETECTED NOT DETECTED Final   Staphylococcus aureus (BCID) NOT DETECTED NOT DETECTED Final   Staphylococcus epidermidis NOT DETECTED NOT DETECTED Final   Staphylococcus lugdunensis NOT DETECTED NOT DETECTED Final   Streptococcus species NOT DETECTED NOT DETECTED Final   Streptococcus agalactiae NOT DETECTED NOT DETECTED Final   Streptococcus pneumoniae NOT DETECTED NOT DETECTED Final   Streptococcus pyogenes  NOT DETECTED NOT DETECTED Final   A.calcoaceticus-baumannii NOT DETECTED NOT DETECTED Final   Bacteroides fragilis NOT DETECTED NOT DETECTED Final   Enterobacterales DETECTED (A) NOT DETECTED Final    Comment: Enterobacterales represent a large order of gram negative bacteria, not a single organism. CRITICAL RESULT CALLED TO, READ BACK BY AND VERIFIED WITH: RN D [place] [id number] AT 6294 AM BY CM    Enterobacter cloacae complex NOT DETECTED NOT DETECTED Final   Escherichia coli NOT DETECTED NOT DETECTED Final   Klebsiella aerogenes NOT DETECTED NOT DETECTED Final   Klebsiella oxytoca NOT DETECTED NOT DETECTED Final   Klebsiella pneumoniae DETECTED (A) NOT DETECTED Final    Comment: CRITICAL RESULT CALLED TO, READ BACK BY AND VERIFIED WITH: RN D [place] [id number] AT 0717 AM BY CM    Proteus species NOT DETECTED NOT DETECTED Final   Salmonella species NOT DETECTED NOT DETECTED Final   Serratia marcescens NOT DETECTED NOT DETECTED Final   Haemophilus influenzae NOT DETECTED NOT DETECTED Final   Neisseria meningitidis NOT DETECTED NOT DETECTED Final   Pseudomonas aeruginosa NOT DETECTED NOT DETECTED Final   Stenotrophomonas maltophilia NOT DETECTED NOT DETECTED Final   Candida albicans NOT DETECTED NOT DETECTED Final   Candida auris NOT DETECTED NOT DETECTED Final   Candida glabrata NOT DETECTED NOT DETECTED Final   Candida krusei NOT DETECTED NOT DETECTED Final   Candida parapsilosis NOT DETECTED NOT DETECTED Final   Candida tropicalis NOT DETECTED NOT DETECTED Final    Cryptococcus neoformans/gattii NOT DETECTED NOT DETECTED Final   CTX-M ESBL NOT DETECTED NOT DETECTED Final   Carbapenem resistance IMP NOT DETECTED NOT DETECTED Final   Carbapenem resistance KPC NOT DETECTED NOT DETECTED Final   Carbapenem resistance NDM NOT DETECTED NOT DETECTED Final   Carbapenem resist OXA 48 LIKE NOT DETECTED NOT DETECTED Final   Carbapenem resistance VIM NOT DETECTED NOT DETECTED Final    Comment: Performed at [facility], [address], [place], [place] [address]  Culture, blood (routine x 2)     Status: None   Collection Time: 11/26/20  5:00 PM   Specimen: BLOOD LEFT HAND  Result Value Ref Range Status   Specimen Description BLOOD LEFT HAND  Final   Special Requests   Final    BOTTLES DRAWN AEROBIC AND ANAEROBIC Blood Culture results may not be optimal due to an inadequate volume of blood received in culture bottles   Culture   Final    NO GROWTH 5 DAYS Performed at [facility], [place] [address] [place], [place] [address]    Report Status 12/01/2020 FINAL  Final  Culture, blood (routine x 2)     Status: None   Collection Time: 11/28/20 12:16 PM   Specimen: BLOOD RIGHT HAND  Result Value Ref Range Status   Specimen Description BLOOD RIGHT HAND  Final   Special Requests   Final    BOTTLES DRAWN AEROBIC ONLY Blood Culture adequate volume   Culture   Final    NO GROWTH 5 DAYS Performed at [facility], [place] [address], [place], [place] [address]    Report Status 12/03/2020 FINAL  Final  Culture, blood (routine x 2)     Status: None   Collection Time: 11/28/20 12:22 PM   Specimen: BLOOD RIGHT FOREARM  Result Value Ref Range Status   Specimen Description BLOOD RIGHT FOREARM  Final   Special Requests   Final    BOTTLES DRAWN AEROBIC ONLY Blood Culture  adequate volume   Culture   Final    NO GROWTH 5 DAYS Performed at [facility], [place] [address] [place], [place] [address]    Report Status 12/03/2020 FINAL  Final    Coagulation  Studies: No results for input(s): LABPROT, INR in the last 72 hours.  Urinalysis: No results for input(s): COLORURINE, LABSPEC, PHURINE, GLUCOSEU, HGBUR, BILIRUBINUR, KETONESUR, PROTEINUR, UROBILINOGEN, NITRITE, LEUKOCYTESUR in the last 72 hours.  Invalid input(s): APPERANCEUR    Imaging: No results found.   Medications:       Assessment/ Plan:  60 y.o. 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   1.  ESRD on HD.  Patient due for hemodialysis treatment today.  Continue MWF schedule.  2.  Anemia of chronic kidney disease.   Lab Results  Component Value Date   HGB 7.8 (L) 12/07/2020   Administer Retacrit 10,000 units IV with dialysis today.  3.  Secondary hyperparathyroidism.  Phosphorus 2.4 and acceptable.  4.  Thrombocytopenia.  Platelets continue to fluctuate quite a bit.  Platelets up to 135,000.  5.  Hyponatremia.  Serum sodium low at 130 today.  This should correct with dialysis treatment today.  6.  Leukocytosis/fever/sepsis.  Kleibsella noted in blood, abx management per primary team and ID.  WBC count up slightly to 12.2.    LOS: 0 Olan Kurek 4/15/20227:24 AM

## 2020-12-09 NOTE — Progress Notes (Signed)
PROGRESS NOTE    Theresa Russell  URK:[id number] DOB: 1961/01/17 DOA: 10/16/2020   Brief Narrative: Theresa Russell is an 60 y.o. female with multiple medical problems including ESRD on dialysis, chronic hypoxemic respiratory failure secondary to COPD, hypertension, hyperlipidemia, coronary disease, diabetes mellitus who apparently was admitted to [facility] when she was brought in by EMS with altered mental status, generalized weakness.  In the emergency room she was found to be tachycardic with heart rate in the 140s and atrial flutter.  Blood pressure was stable.  She was noted to have cellulitis, necrotic wounds to bilateral lower extremities. CT was done and it showed findings concerning for left ischial rectal fossa necrotizing fasciitis.  General surgery was consulted.  They initially recommended transfer to tertiary care center.  She was started on broad-spectrum antimicrobials.  However, transfer was on unsuccessful.  Patient was taken to surgery and underwent incision and drainage of the left perineal and ischio rectal abscess measuring about 5 x 5 x 10 cm.  She subsequently developed septic shock requiring vasopressors.  She was given aggressive hydration and broad-spectrum antimicrobial coverage.  Her wound cultures grew Morganella, E. coli, MRSA.  She was initially treated with IV Zosyn, clindamycin. Nephrology was consulted and patient was receiving hemodialysis.  She also had leukocytosis which improved initially but then again there was an upward trend on 10/11/2020.  She underwent repeat imaging of her pelvis which showed concern for necrotizing fasciitis involving the left thigh extending into the groin.  General surgery was consulted again and patient underwent bedside debridement.  After the procedure her WBC count improved.  Patient failed swallow study therefore placed on tube feeding.  Due to her complex medical problems she was transferred and admitted to [facility] on 10/16/2020. Here she was treated with IV cefepime, Zyvox.  However, she had worsening thrombocytopenia with drop in Platelet count as low as 29.  Therefore recommended to switch to doxycycline.  She completed treatment with the antibiotics.  However, she had worsening leukocytosis and developed left groin/thigh cellulitis.  CT of the abdomen and pelvis without contrast was done on 10/25/2020 per report 12.9 cm mature cystic teratoma within the left ovary.  Also reported was anasarca with extensive subcutaneous body wall edema, mild ascites, small right pleural effusion.  For her thrombocytopenia she was given steroids with some improvement in the platelet count. Due to her worsening cellulitis left thigh ultrasound was done which per report showed complex collection 4 cm dissecting through the subcutaneous fat likely abscess/phlegmon. -She underwent drainage of the left inner thigh abscess on 11/08/2020.  Received treatment with IV meropenem.   11/29/2020: She again had worsening with decompensation.  She had to be placed on BiPAP.  Respiratory cultures from 11/26/2020 per report Klebsiella pneumonia.  She also had blood cultures from 11/26/2020 that came back as Klebsiella.  She had a PermCath which has been removed.  Repeat blood cultures from 11/28/2020 did not show any growth to date.    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  Likely secondary to volume overload/pulmonary edema/pleural effusion, COPD with acute exacerbation. She received treatment with multiple rounds of antibiotics.  She improved but again decompensated. Respiratory culture from 11/26/2020 showed Klebsiella.  She is on treatment with IV cefepime.  We will plan to treat for duration of 2 weeks from 11/28/2020 since she also had the bacteremia. She also has dysphagia and high risk for aspiration and aspiration pneumonia.  Continue to monitor closely.  If her respiratory status worsens suggest repeat chest imaging preferably chest CT which can be done without contrast due to her ESRD.  Sepsis: In the setting of gram-negative bacteremia with Klebsiella.  She had blood cultures that showed Klebsiella.  She also had fever and leukocytosis.  Continue treatment with IV cefepime.  Repeat blood cultures on 11/28/2020 did not show any growth today.  PermCath has been removed.  Plan for a treatment of 14 days from the date of negative blood cultures.  Left ischio rectal fossa necrotizing fasciitis status post debridement/perirectal abscess: She had debridement done and she also had drainage of the perirectal abscess.  She had cultures that showed Morganella morganii, E. coli, MRSA at the outside facility.  She received treatment with IV cefepime, Zyvox.  Unfortunately had worsening thrombocytopenia.  Therefore recommended to DC the Zyvox and she was treated with doxycycline. She completed treatment with doxycycline and cefepime.  However, she had left thigh abscess/phlegmon therefore again had to be treated with meropenem which she completed.  Now on cefepime for the bacteremia and pneumonia.  Continue local wound care.  If wound worsening consider consulting surgery to evaluate.  Left thigh abscess/phlegmon: Left thigh area was indurated with tenderness.  Ultrasound showed  probable abscess/phlegmon. She had a drainage of the left inner thigh abscess on 11/08/2020.  She completed treatment with meropenem now started on antibiotics again in the setting of sepsis with gram-negative bacteremia.  Continue local wound care. Continue to monitor.  Bilateral lower extremity wounds: Continue local wound care.    Thrombocytopenia: Exact etiology for the thrombocytopenia is unclear. HIT panel was negative per the primary team. She received treatment with steroids.  Platelet count improved but now decreased again in the setting of sepsis.  Continue to monitor closely.  End-stage renal disease on hemodialysis: Dialysis per nephrology team.  Medications renally dosed.  Diabetes mellitus type 2: Continue to monitor Accu-Cheks, medications and management of diabetes per the primary team.  She will need proper glycemic control in order to enable wound healing.  COPD: Continue to monitor, management per the primary team.  Left ovarian cystic teratoma: She is high risk for torsion.  Further management per primary team.  Dysphagia/protein calorie malnutrition: Due to her dysphagia she is very high risk for aspiration and recurrent aspiration pneumonia despite being on antibiotics.  Encephalopathy: Likely toxic/metabolic.  Continue supportive management per the primary team.  Chronic systolic congestive heart failure with EF around 40%: Continue medication and management per the primary team.  She was also noted to have pulmonary hypertension on the echocardiogram.  Atrial flutter: On medications per cardiology. Further management per the primary team and cardiology.  Unfortunately due to her complex medical problems she is very high risk for worsening and decompensation.  Plan of care discussed with the primary team and pharmacy.  Plan of care discussed with the patient.  Also discussed with her daughter at the bedside.  Subjective: She had sepsis with bacteremia with blood  cultures from 11/26/2020 that showed  Klebsiella.  She had  a PermCath that was removed.  Repeat blood cultures from 11/28/2020 did not show any growth to date.  She is awake but minimally verbal.  Objective: Vitals: Temperature 97.8, heart rate 93, respiratory 21, blood pressure 153/81, oxygen saturation 94%.  Examination: Constitutional: Chronically ill-appearing female, on oxygen by nasal cannula, awake but minimally verbal. Head: Atraumatic, normocephalic Eyes: PERLA, EOMI  ENMT: external ears and nose appear normal, normal hearing, Lips appears normal, moist oral mucosa Neck:  supple CVS: S1-S2  Respiratory: Decreased breath sounds lower lobes, scattered rhonchi, no wheezing Abdomen: Obese, nontender,normal bowel sounds Musculoskeletal: Bilateral lower extremity wounds with dressing in place, left thigh abscess drained.   Neuro: Severe debility with generalized weakness Psych: Stable Skin: Bilateral lower extremity wounds, left ischial rectal area wound status post surgical debridement  Data Reviewed: I have personally reviewed following labs and imaging studies  CBC: Recent Labs  Lab 12/03/20 0446 12/04/20 0539 12/05/20 0632 12/07/20 0443  WBC 8.6 9.2 11.8* 12.2*  HGB 8.0* 8.7* 8.0* 7.8*  HCT 25.8* 27.9* 26.4* 25.0*  MCV 95.2 95.2 97.1 96.2  PLT 65* 51* 62* 135*    Basic Metabolic Panel: Recent Labs  Lab 12/03/20 0446 12/05/20 0632 12/07/20 0443  NA 138 134* 130*  K 3.8 3.4* 3.9  CL 101 101 96*  CO2 22 22 25   GLUCOSE 172* 199* 159*  BUN 84* 59* 42*  CREATININE 4.24* 3.51* 3.18*  CALCIUM 9.0 9.0 8.6*  MG  --   --  1.9  PHOS 3.3 1.9* 2.4*    GFR: CrCl cannot be calculated (Unknown ideal weight.).  Liver Function Tests: Recent Labs  Lab 12/03/20 0446 12/05/20 0632 12/07/20 0443  ALBUMIN 2.4* 2.4* 2.6*    CBG: No results for input(s): GLUCAP in the last 168 hours.   No results found for this or any previous visit (from the past 240 hour(s)).    Scheduled Meds: Please see MAR   Yaakov Guthrie, MD   12/09/2020, 6:27 PM

## 2020-12-10 LAB — RENAL FUNCTION PANEL
Albumin: 2.7 g/dL — ABNORMAL LOW (ref 3.5–5.0)
Anion gap: 11 (ref 5–15)
BUN: 41 mg/dL — ABNORMAL HIGH (ref 6–20)
CO2: 23 mmol/L (ref 22–32)
Calcium: 8.8 mg/dL — ABNORMAL LOW (ref 8.9–10.3)
Chloride: 101 mmol/L (ref 98–111)
Creatinine, Ser: 4.09 mg/dL — ABNORMAL HIGH (ref 0.44–1.00)
GFR, Estimated: 12 mL/min — ABNORMAL LOW (ref >60)
Glucose, Bld: 140 mg/dL — ABNORMAL HIGH (ref 70–99)
Phosphorus: 4.3 mg/dL (ref 2.5–4.6)
Potassium: 4.6 mmol/L (ref 3.5–5.1)
Sodium: 135 mmol/L (ref 135–145)

## 2020-12-10 LAB — CBC
HCT: 27.3 % — ABNORMAL LOW (ref 36.0–46.0)
Hemoglobin: 8.3 g/dL — ABNORMAL LOW (ref 12.0–15.0)
MCH: 29.6 pg (ref 26.0–34.0)
MCHC: 30.4 g/dL (ref 30.0–36.0)
MCV: 97.5 fL (ref 80.0–100.0)
Platelets: 358 10*3/uL (ref 150–400)
RBC: 2.8 MIL/uL — ABNORMAL LOW (ref 3.87–5.11)
RDW: 20.9 % — ABNORMAL HIGH (ref 11.5–15.5)
WBC: 9.3 10*3/uL (ref 4.0–10.5)
nRBC: 0.6 % — ABNORMAL HIGH (ref 0.0–0.2)

## 2020-12-11 NOTE — Progress Notes (Signed)
[facility]  ROUNDING NOTE   Subjective:  Patient reports feeling some pain in her gluteal region given dressing change today. Due for dialysis again tomorrow.   Objective:  Vital signs in last 24 hours:  Temperature 97.4 pulse 101 respirations 24 blood pressure 149/72   Physical Exam: General:  No acute distress  Head:  Normocephalic, atraumatic. Moist oral mucosal membranes  Eyes:  Anicteric  Neck:  Supple  Lungs:   Clear bilateral, normal effort  Heart:  S1S2 no rubs  Abdomen:   Soft, nontender, bowel sounds present  Extremities:  No lower extremity edema  Neurologic:  Awake, alert, follows simple commands  Skin:  No acute rash  Access:  Right IJ temporary dialysis catheter in place    Basic Metabolic Panel: Recent Labs  Lab 12/05/20 0632 12/07/20 0443 12/10/20 0642  NA 134* 130* 135  K 3.4* 3.9 4.6  CL 101 96* 101  CO2 22 25 23   GLUCOSE 199* 159* 140*  BUN 59* 42* 41*  CREATININE 3.51* 3.18* 4.09*  CALCIUM 9.0 8.6* 8.8*  MG  --  1.9  --   PHOS 1.9* 2.4* 4.3    Liver Function Tests: Recent Labs  Lab 12/05/20 0632 12/07/20 0443 12/10/20 0642  ALBUMIN 2.4* 2.6* 2.7*   No results for input(s): LIPASE, AMYLASE in the last 168 hours. Recent Labs  Lab 12/07/20 0443  AMMONIA 52*    CBC: Recent Labs  Lab 12/05/20 0632 12/07/20 0443 12/10/20 0642  WBC 11.8* 12.2* 9.3  HGB 8.0* 7.8* 8.3*  HCT 26.4* 25.0* 27.3*  MCV 97.1 96.2 97.5  PLT 62* 135* 358    Cardiac Enzymes: No results for input(s): CKTOTAL, CKMB, CKMBINDEX, TROPONINI in the last 168 hours.  BNP: Invalid input(s): POCBNP  CBG: No results for input(s): GLUCAP in the last 168 hours.  Microbiology: Results for orders placed or performed during the hospital encounter of 10/16/20  Culture, blood (routine x 2)     Status: None   Collection Time: 10/31/20  8:54 AM   Specimen: BLOOD LEFT HAND  Result Value Ref Range Status   Specimen Description BLOOD LEFT HAND  Final    Special Requests   Final    BOTTLES DRAWN AEROBIC AND ANAEROBIC Blood Culture results may not be optimal due to an inadequate volume of blood received in culture bottles   Culture   Final    NO GROWTH 5 DAYS Performed at [facility], [place] [address] [place], [place] [address]    Report Status 11/05/2020 FINAL  Final  Culture, blood (routine x 2)     Status: None   Collection Time: 10/31/20  9:48 AM   Specimen: BLOOD LEFT HAND  Result Value Ref Range Status   Specimen Description BLOOD LEFT HAND  Final   Special Requests   Final    BOTTLES DRAWN AEROBIC ONLY Blood Culture adequate volume   Culture   Final    NO GROWTH 5 DAYS Performed at [facility], [place] [address] [place], [place] [address]    Report Status 11/05/2020 FINAL  Final  Expectorated Sputum Assessment w Gram Stain, Rflx to Resp Cult     Status: None   Collection Time: 11/26/20  8:37 AM   Specimen: Sputum  Result Value Ref Range Status   Specimen Description SPUTUM  Final   Special Requests NONE  Final   Sputum evaluation   Final    THIS SPECIMEN IS ACCEPTABLE FOR SPUTUM CULTURE Performed  at [facility], [place] [address] [place], [place] [address]    Report Status 11/27/2020 FINAL  Final  Culture, Respiratory w Gram Stain     Status: None   Collection Time: 11/26/20  8:37 AM   Specimen: SPU  Result Value Ref Range Status   Specimen Description SPUTUM  Final   Special Requests NONE Reflexed from [id number]  Final   Gram Stain   Final    ABUNDANT WBC PRESENT,BOTH PMN AND MONONUCLEAR FEW GRAM VARIABLE ROD RARE GRAM POSITIVE COCCI Performed at [facility], [place] [address], [place], [place] [address]    Culture RARE KLEBSIELLA PNEUMONIAE  Final   Report Status 11/29/2020 FINAL  Final   Organism ID, Bacteria KLEBSIELLA PNEUMONIAE  Final      Susceptibility   Klebsiella pneumoniae - MIC*    AMPICILLIN >=32 RESISTANT Resistant     CEFAZOLIN <=4 SENSITIVE Sensitive     CEFEPIME <=0.12 SENSITIVE  Sensitive     CEFTAZIDIME <=1 SENSITIVE Sensitive     CEFTRIAXONE <=0.25 SENSITIVE Sensitive     CIPROFLOXACIN <=0.25 SENSITIVE Sensitive     GENTAMICIN <=1 SENSITIVE Sensitive     IMIPENEM <=0.25 SENSITIVE Sensitive     TRIMETH/SULFA <=20 SENSITIVE Sensitive     AMPICILLIN/SULBACTAM 4 SENSITIVE Sensitive     PIP/TAZO <=4 SENSITIVE Sensitive     * RARE KLEBSIELLA PNEUMONIAE  Culture, blood (routine x 2)     Status: Abnormal   Collection Time: 11/26/20 10:21 AM   Specimen: BLOOD LEFT HAND  Result Value Ref Range Status   Specimen Description BLOOD LEFT HAND  Final   Special Requests   Final    BOTTLES DRAWN AEROBIC AND ANAEROBIC Blood Culture adequate volume   Culture  Setup Time   Final    AEROBIC BOTTLE ONLY GRAM NEGATIVE RODS CRITICAL RESULT CALLED TO, READ BACK BY AND VERIFIED WITH: RN D [place] [id number] AT 0717 AM BY CM Performed at [facility], [address] [place],  [address]    Culture KLEBSIELLA PNEUMONIAE (A)  Final   Report Status 11/29/2020 FINAL  Final   Organism ID, Bacteria KLEBSIELLA PNEUMONIAE  Final      Susceptibility   Klebsiella pneumoniae - MIC*    AMPICILLIN >=32 RESISTANT Resistant     CEFAZOLIN <=4 SENSITIVE Sensitive     CEFEPIME <=0.12 SENSITIVE Sensitive     CEFTAZIDIME <=1 SENSITIVE Sensitive     CEFTRIAXONE <=0.25 SENSITIVE Sensitive     CIPROFLOXACIN <=0.25 SENSITIVE Sensitive     GENTAMICIN <=1 SENSITIVE Sensitive     IMIPENEM <=0.25 SENSITIVE Sensitive     TRIMETH/SULFA <=20 SENSITIVE Sensitive     AMPICILLIN/SULBACTAM 4 SENSITIVE Sensitive     PIP/TAZO <=4 SENSITIVE Sensitive     * KLEBSIELLA PNEUMONIAE  Blood Culture ID Panel (Reflexed)     Status: Abnormal   Collection Time: 11/26/20 10:21 AM  Result Value Ref Range Status   Enterococcus faecalis NOT DETECTED NOT DETECTED Final   Enterococcus Faecium NOT DETECTED NOT DETECTED Final   Listeria monocytogenes NOT DETECTED NOT DETECTED Final   Staphylococcus species NOT  DETECTED NOT DETECTED Final   Staphylococcus aureus (BCID) NOT DETECTED NOT DETECTED Final   Staphylococcus epidermidis NOT DETECTED NOT DETECTED Final   Staphylococcus lugdunensis NOT DETECTED NOT DETECTED Final   Streptococcus species NOT DETECTED NOT DETECTED Final   Streptococcus agalactiae NOT DETECTED NOT DETECTED Final   Streptococcus pneumoniae NOT DETECTED NOT DETECTED Final   Streptococcus  pyogenes NOT DETECTED NOT DETECTED Final   A.calcoaceticus-baumannii NOT DETECTED NOT DETECTED Final   Bacteroides fragilis NOT DETECTED NOT DETECTED Final   Enterobacterales DETECTED (A) NOT DETECTED Final    Comment: Enterobacterales represent a large order of gram negative bacteria, not a single organism. CRITICAL RESULT CALLED TO, READ BACK BY AND VERIFIED WITH: RN D [place] [id number] AT 9381 AM BY CM    Enterobacter cloacae complex NOT DETECTED NOT DETECTED Final   Escherichia coli NOT DETECTED NOT DETECTED Final   Klebsiella aerogenes NOT DETECTED NOT DETECTED Final   Klebsiella oxytoca NOT DETECTED NOT DETECTED Final   Klebsiella pneumoniae DETECTED (A) NOT DETECTED Final    Comment: CRITICAL RESULT CALLED TO, READ BACK BY AND VERIFIED WITH: RN D [place] [id number] AT 0717 AM BY CM    Proteus species NOT DETECTED NOT DETECTED Final   Salmonella species NOT DETECTED NOT DETECTED Final   Serratia marcescens NOT DETECTED NOT DETECTED Final   Haemophilus influenzae NOT DETECTED NOT DETECTED Final   Neisseria meningitidis NOT DETECTED NOT DETECTED Final   Pseudomonas aeruginosa NOT DETECTED NOT DETECTED Final   Stenotrophomonas maltophilia NOT DETECTED NOT DETECTED Final   Candida albicans NOT DETECTED NOT DETECTED Final   Candida auris NOT DETECTED NOT DETECTED Final   Candida glabrata NOT DETECTED NOT DETECTED Final   Candida krusei NOT DETECTED NOT DETECTED Final   Candida parapsilosis NOT DETECTED NOT DETECTED Final   Candida tropicalis NOT DETECTED NOT DETECTED Final    Cryptococcus neoformans/gattii NOT DETECTED NOT DETECTED Final   CTX-M ESBL NOT DETECTED NOT DETECTED Final   Carbapenem resistance IMP NOT DETECTED NOT DETECTED Final   Carbapenem resistance KPC NOT DETECTED NOT DETECTED Final   Carbapenem resistance NDM NOT DETECTED NOT DETECTED Final   Carbapenem resist OXA 48 LIKE NOT DETECTED NOT DETECTED Final   Carbapenem resistance VIM NOT DETECTED NOT DETECTED Final    Comment: Performed at [facility], [address] [place], [place] [address]  Culture, blood (routine x 2)     Status: None   Collection Time: 11/26/20  5:00 PM   Specimen: BLOOD LEFT HAND  Result Value Ref Range Status   Specimen Description BLOOD LEFT HAND  Final   Special Requests   Final    BOTTLES DRAWN AEROBIC AND ANAEROBIC Blood Culture results may not be optimal due to an inadequate volume of blood received in culture bottles   Culture   Final    NO GROWTH 5 DAYS Performed at [facility], [place] [address], [place], [place] [address]    Report Status 12/01/2020 FINAL  Final  Culture, blood (routine x 2)     Status: None   Collection Time: 11/28/20 12:16 PM   Specimen: BLOOD RIGHT HAND  Result Value Ref Range Status   Specimen Description BLOOD RIGHT HAND  Final   Special Requests   Final    BOTTLES DRAWN AEROBIC ONLY Blood Culture adequate volume   Culture   Final    NO GROWTH 5 DAYS Performed at [facility], [place] [address], [place], [place] [address]    Report Status 12/03/2020 FINAL  Final  Culture, blood (routine x 2)     Status: None   Collection Time: 11/28/20 12:22 PM   Specimen: BLOOD RIGHT FOREARM  Result Value Ref Range Status   Specimen Description BLOOD RIGHT FOREARM  Final   Special Requests   Final    BOTTLES DRAWN AEROBIC ONLY Blood  Culture adequate volume   Culture   Final    NO GROWTH 5 DAYS Performed at [facility], [place] [address], [place], [place] [address]    Report Status 12/03/2020 FINAL  Final    Coagulation  Studies: No results for input(s): LABPROT, INR in the last 72 hours.  Urinalysis: No results for input(s): COLORURINE, LABSPEC, PHURINE, GLUCOSEU, HGBUR, BILIRUBINUR, KETONESUR, PROTEINUR, UROBILINOGEN, NITRITE, LEUKOCYTESUR in the last 72 hours.  Invalid input(s): APPERANCEUR    Imaging: No results found.   Medications:       Assessment/ Plan:  60 y.o. 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   1.  ESRD on HD.  We will plan for hemodialysis treatment again tomorrow.  Orders have been prepared.  2.  Anemia of chronic kidney disease.   Lab Results  Component Value Date   HGB 8.3 (L) 12/10/2020   Continue Retacrit 10,000 units IV with dialysis treatments.  3.  Secondary hyperparathyroidism.  Phosphorus currently 4.3 and at target range.  4.  Thrombocytopenia.  Platelets have now normalized and currently 358,000.  5.  Hyponatremia.  Sodium now up to 135.  Continued.  Will monitor.  6.  Leukocytosis/fever/sepsis.  Kleibsella noted in blood, abx management per primary team and ID.  WBC count down to 9.3.   LOS: 0 Jevaeh Shams 4/19/20222:16 PM

## 2020-12-12 LAB — RENAL FUNCTION PANEL
Albumin: 2.5 g/dL — ABNORMAL LOW (ref 3.5–5.0)
Anion gap: 11 (ref 5–15)
BUN: 34 mg/dL — ABNORMAL HIGH (ref 6–20)
CO2: 25 mmol/L (ref 22–32)
Calcium: 8.7 mg/dL — ABNORMAL LOW (ref 8.9–10.3)
Chloride: 101 mmol/L (ref 98–111)
Creatinine, Ser: 3.63 mg/dL — ABNORMAL HIGH (ref 0.44–1.00)
GFR, Estimated: 14 mL/min — ABNORMAL LOW (ref >60)
Glucose, Bld: 101 mg/dL — ABNORMAL HIGH (ref 70–99)
Phosphorus: 4 mg/dL (ref 2.5–4.6)
Potassium: 5.1 mmol/L (ref 3.5–5.1)
Sodium: 137 mmol/L (ref 135–145)

## 2020-12-12 LAB — HEPATITIS B SURFACE ANTIGEN: Hepatitis B Surface Ag: NONREACTIVE

## 2020-12-12 LAB — CBC
HCT: 28.6 % — ABNORMAL LOW (ref 36.0–46.0)
Hemoglobin: 8.6 g/dL — ABNORMAL LOW (ref 12.0–15.0)
MCH: 29.8 pg (ref 26.0–34.0)
MCHC: 30.1 g/dL (ref 30.0–36.0)
MCV: 99 fL (ref 80.0–100.0)
Platelets: 332 10*3/uL (ref 150–400)
RBC: 2.89 MIL/uL — ABNORMAL LOW (ref 3.87–5.11)
RDW: 20.9 % — ABNORMAL HIGH (ref 11.5–15.5)
WBC: 9.3 10*3/uL (ref 4.0–10.5)
nRBC: 1.8 % — ABNORMAL HIGH (ref 0.0–0.2)

## 2020-12-12 NOTE — Progress Notes (Signed)
[facility]  ROUNDING NOTE   Subjective:  Patient seen and evaluated at bedside. Seen and evaluated during dialysis treatment. Tolerating treatment well.   Objective:  Vital signs in last 24 hours:  Temperature 96.3 pulse 89 respiration 16 blood pressure 172/66   Physical Exam: General:  No acute distress  Head:  Normocephalic, atraumatic. Moist oral mucosal membranes  Eyes:  Anicteric  Neck:  Supple  Lungs:   Clear bilateral, normal effort  Heart:  S1S2 no rubs  Abdomen:   Soft, nontender, bowel sounds present  Extremities:  No lower extremity edema  Neurologic:  Awake, alert, follows simple commands  Skin:  No acute rash  Access:  Right IJ temporary dialysis catheter in place    Basic Metabolic Panel: Recent Labs  Lab 12/07/20 0443 12/10/20 0642 12/12/20 0500  NA 130* 135 137  K 3.9 4.6 5.1  CL 96* 101 101  CO2 25 23 25   GLUCOSE 159* 140* 101*  BUN 42* 41* 34*  CREATININE 3.18* 4.09* 3.63*  CALCIUM 8.6* 8.8* 8.7*  MG 1.9  --   --   PHOS 2.4* 4.3 4.0    Liver Function Tests: Recent Labs  Lab 12/07/20 0443 12/10/20 0642 12/12/20 0500  ALBUMIN 2.6* 2.7* 2.5*   No results for input(s): LIPASE, AMYLASE in the last 168 hours. Recent Labs  Lab 12/07/20 0443  AMMONIA 52*    CBC: Recent Labs  Lab 12/07/20 0443 12/10/20 0642 12/12/20 0500  WBC 12.2* 9.3 9.3  HGB 7.8* 8.3* 8.6*  HCT 25.0* 27.3* 28.6*  MCV 96.2 97.5 99.0  PLT 135* 358 332    Cardiac Enzymes: No results for input(s): CKTOTAL, CKMB, CKMBINDEX, TROPONINI in the last 168 hours.  BNP: Invalid input(s): POCBNP  CBG: No results for input(s): GLUCAP in the last 168 hours.  Microbiology: Results for orders placed or performed during the hospital encounter of 10/16/20  Culture, blood (routine x 2)     Status: None   Collection Time: 10/31/20  8:54 AM   Specimen: BLOOD LEFT HAND  Result Value Ref Range Status   Specimen Description BLOOD LEFT HAND  Final   Special  Requests   Final    BOTTLES DRAWN AEROBIC AND ANAEROBIC Blood Culture results may not be optimal due to an inadequate volume of blood received in culture bottles   Culture   Final    NO GROWTH 5 DAYS Performed at [facility], [place] [address] [place], [place] [address]    Report Status 11/05/2020 FINAL  Final  Culture, blood (routine x 2)     Status: None   Collection Time: 10/31/20  9:48 AM   Specimen: BLOOD LEFT HAND  Result Value Ref Range Status   Specimen Description BLOOD LEFT HAND  Final   Special Requests   Final    BOTTLES DRAWN AEROBIC ONLY Blood Culture adequate volume   Culture   Final    NO GROWTH 5 DAYS Performed at [facility], [place] [address], [place], [place] [address]    Report Status 11/05/2020 FINAL  Final  Expectorated Sputum Assessment w Gram Stain, Rflx to Resp Cult     Status: None   Collection Time: 11/26/20  8:37 AM   Specimen: Sputum  Result Value Ref Range Status   Specimen Description SPUTUM  Final   Special Requests NONE  Final   Sputum evaluation   Final    THIS SPECIMEN IS ACCEPTABLE FOR SPUTUM CULTURE Performed at [facility], [place] [address] [place], [place] [address]    Report Status 11/27/2020 FINAL  Final  Culture, Respiratory w Gram Stain     Status: None   Collection Time: 11/26/20  8:37 AM   Specimen: SPU  Result Value Ref Range Status   Specimen Description SPUTUM  Final   Special Requests NONE Reflexed from [id number]  Final   Gram Stain   Final    ABUNDANT WBC PRESENT,BOTH PMN AND MONONUCLEAR FEW GRAM VARIABLE ROD RARE GRAM POSITIVE COCCI Performed at [facility], [place] [address], [place], [place] [address]    Culture RARE KLEBSIELLA PNEUMONIAE  Final   Report Status 11/29/2020 FINAL  Final   Organism ID, Bacteria KLEBSIELLA PNEUMONIAE  Final      Susceptibility   Klebsiella pneumoniae - MIC*    AMPICILLIN >=32 RESISTANT Resistant     CEFAZOLIN <=4 SENSITIVE Sensitive     CEFEPIME <=0.12 SENSITIVE  Sensitive     CEFTAZIDIME <=1 SENSITIVE Sensitive     CEFTRIAXONE <=0.25 SENSITIVE Sensitive     CIPROFLOXACIN <=0.25 SENSITIVE Sensitive     GENTAMICIN <=1 SENSITIVE Sensitive     IMIPENEM <=0.25 SENSITIVE Sensitive     TRIMETH/SULFA <=20 SENSITIVE Sensitive     AMPICILLIN/SULBACTAM 4 SENSITIVE Sensitive     PIP/TAZO <=4 SENSITIVE Sensitive     * RARE KLEBSIELLA PNEUMONIAE  Culture, blood (routine x 2)     Status: Abnormal   Collection Time: 11/26/20 10:21 AM   Specimen: BLOOD LEFT HAND  Result Value Ref Range Status   Specimen Description BLOOD LEFT HAND  Final   Special Requests   Final    BOTTLES DRAWN AEROBIC AND ANAEROBIC Blood Culture adequate volume   Culture  Setup Time   Final    AEROBIC BOTTLE ONLY GRAM NEGATIVE RODS CRITICAL RESULT CALLED TO, READ BACK BY AND VERIFIED WITH: RN D [place] [id number] AT 0717 AM BY CM Performed at [facility], [address], [place], [place] [address]    Culture KLEBSIELLA PNEUMONIAE (A)  Final   Report Status 11/29/2020 FINAL  Final   Organism ID, Bacteria KLEBSIELLA PNEUMONIAE  Final      Susceptibility   Klebsiella pneumoniae - MIC*    AMPICILLIN >=32 RESISTANT Resistant     CEFAZOLIN <=4 SENSITIVE Sensitive     CEFEPIME <=0.12 SENSITIVE Sensitive     CEFTAZIDIME <=1 SENSITIVE Sensitive     CEFTRIAXONE <=0.25 SENSITIVE Sensitive     CIPROFLOXACIN <=0.25 SENSITIVE Sensitive     GENTAMICIN <=1 SENSITIVE Sensitive     IMIPENEM <=0.25 SENSITIVE Sensitive     TRIMETH/SULFA <=20 SENSITIVE Sensitive     AMPICILLIN/SULBACTAM 4 SENSITIVE Sensitive     PIP/TAZO <=4 SENSITIVE Sensitive     * KLEBSIELLA PNEUMONIAE  Blood Culture ID Panel (Reflexed)     Status: Abnormal   Collection Time: 11/26/20 10:21 AM  Result Value Ref Range Status   Enterococcus faecalis NOT DETECTED NOT DETECTED Final   Enterococcus Faecium NOT DETECTED NOT DETECTED Final   Listeria monocytogenes NOT DETECTED NOT DETECTED Final   Staphylococcus species NOT  DETECTED NOT DETECTED Final   Staphylococcus aureus (BCID) NOT DETECTED NOT DETECTED Final   Staphylococcus epidermidis NOT DETECTED NOT DETECTED Final   Staphylococcus lugdunensis NOT DETECTED NOT DETECTED Final   Streptococcus species NOT DETECTED NOT DETECTED Final   Streptococcus agalactiae NOT DETECTED NOT DETECTED Final   Streptococcus pneumoniae NOT DETECTED NOT DETECTED Final   Streptococcus pyogenes NOT DETECTED  NOT DETECTED Final   A.calcoaceticus-baumannii NOT DETECTED NOT DETECTED Final   Bacteroides fragilis NOT DETECTED NOT DETECTED Final   Enterobacterales DETECTED (A) NOT DETECTED Final    Comment: Enterobacterales represent a large order of gram negative bacteria, not a single organism. CRITICAL RESULT CALLED TO, READ BACK BY AND VERIFIED WITH: RN D [place] [id number] AT 2094 AM BY CM    Enterobacter cloacae complex NOT DETECTED NOT DETECTED Final   Escherichia coli NOT DETECTED NOT DETECTED Final   Klebsiella aerogenes NOT DETECTED NOT DETECTED Final   Klebsiella oxytoca NOT DETECTED NOT DETECTED Final   Klebsiella pneumoniae DETECTED (A) NOT DETECTED Final    Comment: CRITICAL RESULT CALLED TO, READ BACK BY AND VERIFIED WITH: RN D [place] [id number] AT 0717 AM BY CM    Proteus species NOT DETECTED NOT DETECTED Final   Salmonella species NOT DETECTED NOT DETECTED Final   Serratia marcescens NOT DETECTED NOT DETECTED Final   Haemophilus influenzae NOT DETECTED NOT DETECTED Final   Neisseria meningitidis NOT DETECTED NOT DETECTED Final   Pseudomonas aeruginosa NOT DETECTED NOT DETECTED Final   Stenotrophomonas maltophilia NOT DETECTED NOT DETECTED Final   Candida albicans NOT DETECTED NOT DETECTED Final   Candida auris NOT DETECTED NOT DETECTED Final   Candida glabrata NOT DETECTED NOT DETECTED Final   Candida krusei NOT DETECTED NOT DETECTED Final   Candida parapsilosis NOT DETECTED NOT DETECTED Final   Candida tropicalis NOT DETECTED NOT DETECTED Final    Cryptococcus neoformans/gattii NOT DETECTED NOT DETECTED Final   CTX-M ESBL NOT DETECTED NOT DETECTED Final   Carbapenem resistance IMP NOT DETECTED NOT DETECTED Final   Carbapenem resistance KPC NOT DETECTED NOT DETECTED Final   Carbapenem resistance NDM NOT DETECTED NOT DETECTED Final   Carbapenem resist OXA 48 LIKE NOT DETECTED NOT DETECTED Final   Carbapenem resistance VIM NOT DETECTED NOT DETECTED Final    Comment: Performed at [facility], [address] [place], [place] [address]  Culture, blood (routine x 2)     Status: None   Collection Time: 11/26/20  5:00 PM   Specimen: BLOOD LEFT HAND  Result Value Ref Range Status   Specimen Description BLOOD LEFT HAND  Final   Special Requests   Final    BOTTLES DRAWN AEROBIC AND ANAEROBIC Blood Culture results may not be optimal due to an inadequate volume of blood received in culture bottles   Culture   Final    NO GROWTH 5 DAYS Performed at [facility], [place] [address] [place], [place] [address]    Report Status 12/01/2020 FINAL  Final  Culture, blood (routine x 2)     Status: None   Collection Time: 11/28/20 12:16 PM   Specimen: BLOOD RIGHT HAND  Result Value Ref Range Status   Specimen Description BLOOD RIGHT HAND  Final   Special Requests   Final    BOTTLES DRAWN AEROBIC ONLY Blood Culture adequate volume   Culture   Final    NO GROWTH 5 DAYS Performed at [facility], [place] [address] [place], [place] [address]    Report Status 12/03/2020 FINAL  Final  Culture, blood (routine x 2)     Status: None   Collection Time: 11/28/20 12:22 PM   Specimen: BLOOD RIGHT FOREARM  Result Value Ref Range Status   Specimen Description BLOOD RIGHT FOREARM  Final   Special Requests   Final    BOTTLES DRAWN AEROBIC ONLY Blood Culture adequate volume  Culture   Final    NO GROWTH 5 DAYS Performed at [facility], [place] [address] [place], [place] [address]    Report Status 12/03/2020 FINAL  Final    Coagulation  Studies: No results for input(s): LABPROT, INR in the last 72 hours.  Urinalysis: No results for input(s): COLORURINE, LABSPEC, PHURINE, GLUCOSEU, HGBUR, BILIRUBINUR, KETONESUR, PROTEINUR, UROBILINOGEN, NITRITE, LEUKOCYTESUR in the last 72 hours.  Invalid input(s): APPERANCEUR    Imaging: No results found.   Medications:       Assessment/ Plan:  60 y.o. 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   1.  ESRD on HD.  Patient seen evaluated during dialysis treatment today.  Tolerating well.  We will plan to complete dialysis treatment today.  2.  Anemia of chronic kidney disease.   Lab Results  Component Value Date   HGB 8.6 (L) 12/12/2020   Administer Retacrit 10,000 units IV with dialysis treatment today.  3.  Secondary hyperparathyroidism.  Phosphorus at target at 4.0.  Continue to periodically monitor.  4.  Thrombocytopenia.  Platelets remain normal at 332,000.  5.  Hyponatremia.  Sodium up to 137 today.  Continue to periodically monitor.  6.  Leukocytosis/fever/sepsis.  Kleibsella noted in blood, abx management per primary team and ID.  WBC count down to 9.3.   LOS: 0 Izack Hoogland 4/20/202210:44 AM

## 2020-12-14 LAB — RENAL FUNCTION PANEL
Albumin: 2.6 g/dL — ABNORMAL LOW (ref 3.5–5.0)
Anion gap: 13 (ref 5–15)
BUN: 36 mg/dL — ABNORMAL HIGH (ref 6–20)
CO2: 23 mmol/L (ref 22–32)
Calcium: 8.6 mg/dL — ABNORMAL LOW (ref 8.9–10.3)
Chloride: 101 mmol/L (ref 98–111)
Creatinine, Ser: 3.6 mg/dL — ABNORMAL HIGH (ref 0.44–1.00)
GFR, Estimated: 14 mL/min — ABNORMAL LOW (ref >60)
Glucose, Bld: 109 mg/dL — ABNORMAL HIGH (ref 70–99)
Phosphorus: 4.2 mg/dL (ref 2.5–4.6)
Potassium: 4.9 mmol/L (ref 3.5–5.1)
Sodium: 137 mmol/L (ref 135–145)

## 2020-12-14 LAB — CBC
HCT: 26.2 % — ABNORMAL LOW (ref 36.0–46.0)
Hemoglobin: 7.7 g/dL — ABNORMAL LOW (ref 12.0–15.0)
MCH: 29.3 pg (ref 26.0–34.0)
MCHC: 29.4 g/dL — ABNORMAL LOW (ref 30.0–36.0)
MCV: 99.6 fL (ref 80.0–100.0)
Platelets: 271 10*3/uL (ref 150–400)
RBC: 2.63 MIL/uL — ABNORMAL LOW (ref 3.87–5.11)
RDW: 20.9 % — ABNORMAL HIGH (ref 11.5–15.5)
WBC: 11.6 10*3/uL — ABNORMAL HIGH (ref 4.0–10.5)
nRBC: 2.1 % — ABNORMAL HIGH (ref 0.0–0.2)

## 2020-12-14 NOTE — Progress Notes (Signed)
[facility]  ROUNDING NOTE   Subjective:  Patient seen and evaluated at bedside. Due for hemodialysis treatment today. Complaining of pain in her legs.   Objective:  Vital signs in last 24 hours:  Temperature 97.3 pulse 94 respirations 18 blood pressure 165/88   Physical Exam: General:  No acute distress  Head:  Normocephalic, atraumatic. Moist oral mucosal membranes  Eyes:  Anicteric  Neck:  Supple  Lungs:   Clear bilateral, normal effort  Heart:  S1S2 no rubs  Abdomen:   Soft, nontender, bowel sounds present  Extremities:  No lower extremity edema  Neurologic:  Awake, alert, follows simple commands  Skin:  No acute rash  Access:  Right IJ temporary dialysis catheter in place    Basic Metabolic Panel: Recent Labs  Lab 12/10/20 0642 12/12/20 0500  NA 135 137  K 4.6 5.1  CL 101 101  CO2 23 25  GLUCOSE 140* 101*  BUN 41* 34*  CREATININE 4.09* 3.63*  CALCIUM 8.8* 8.7*  PHOS 4.3 4.0    Liver Function Tests: Recent Labs  Lab 12/10/20 0642 12/12/20 0500  ALBUMIN 2.7* 2.5*   No results for input(s): LIPASE, AMYLASE in the last 168 hours. No results for input(s): AMMONIA in the last 168 hours.  CBC: Recent Labs  Lab 12/10/20 0642 12/12/20 0500 12/14/20 0629  WBC 9.3 9.3 11.6*  HGB 8.3* 8.6* 7.7*  HCT 27.3* 28.6* 26.2*  MCV 97.5 99.0 99.6  PLT 358 332 271    Cardiac Enzymes: No results for input(s): CKTOTAL, CKMB, CKMBINDEX, TROPONINI in the last 168 hours.  BNP: Invalid input(s): POCBNP  CBG: No results for input(s): GLUCAP in the last 168 hours.  Microbiology: Results for orders placed or performed during the hospital encounter of 10/16/20  Culture, blood (routine x 2)     Status: None   Collection Time: 10/31/20  8:54 AM   Specimen: BLOOD LEFT HAND  Result Value Ref Range Status   Specimen Description BLOOD LEFT HAND  Final   Special Requests   Final    BOTTLES DRAWN AEROBIC AND ANAEROBIC Blood Culture results may not be  optimal due to an inadequate volume of blood received in culture bottles   Culture   Final    NO GROWTH 5 DAYS Performed at [facility], [place] [address], [place], [place] [address]    Report Status 11/05/2020 FINAL  Final  Culture, blood (routine x 2)     Status: None   Collection Time: 10/31/20  9:48 AM   Specimen: BLOOD LEFT HAND  Result Value Ref Range Status   Specimen Description BLOOD LEFT HAND  Final   Special Requests   Final    BOTTLES DRAWN AEROBIC ONLY Blood Culture adequate volume   Culture   Final    NO GROWTH 5 DAYS Performed at [facility], [place] [address] [place], [place] [address]    Report Status 11/05/2020 FINAL  Final  Expectorated Sputum Assessment w Gram Stain, Rflx to Resp Cult     Status: None   Collection Time: 11/26/20  8:37 AM   Specimen: Sputum  Result Value Ref Range Status   Specimen Description SPUTUM  Final   Special Requests NONE  Final   Sputum evaluation   Final    THIS SPECIMEN IS ACCEPTABLE FOR SPUTUM CULTURE Performed at [facility], [place] [address], [place], [place] [address]    Report Status 11/27/2020 FINAL  Final  Culture, Respiratory w  Gram Stain     Status: None   Collection Time: 11/26/20  8:37 AM   Specimen: SPU  Result Value Ref Range Status   Specimen Description SPUTUM  Final   Special Requests NONE Reflexed from M2512  Final   Gram Stain   Final    ABUNDANT WBC PRESENT,BOTH PMN AND MONONUCLEAR FEW GRAM VARIABLE ROD RARE GRAM POSITIVE COCCI Performed at [facility], [place] [address], [place], [place] [address]    Culture RARE KLEBSIELLA PNEUMONIAE  Final   Report Status 11/29/2020 FINAL  Final   Organism ID, Bacteria KLEBSIELLA PNEUMONIAE  Final      Susceptibility   Klebsiella pneumoniae - MIC*    AMPICILLIN >=32 RESISTANT Resistant     CEFAZOLIN <=4 SENSITIVE Sensitive     CEFEPIME <=0.12 SENSITIVE Sensitive     CEFTAZIDIME <=1 SENSITIVE Sensitive     CEFTRIAXONE <=0.25 SENSITIVE Sensitive      CIPROFLOXACIN <=0.25 SENSITIVE Sensitive     GENTAMICIN <=1 SENSITIVE Sensitive     IMIPENEM <=0.25 SENSITIVE Sensitive     TRIMETH/SULFA <=20 SENSITIVE Sensitive     AMPICILLIN/SULBACTAM 4 SENSITIVE Sensitive     PIP/TAZO <=4 SENSITIVE Sensitive     * RARE KLEBSIELLA PNEUMONIAE  Culture, blood (routine x 2)     Status: Abnormal   Collection Time: 11/26/20 10:21 AM   Specimen: BLOOD LEFT HAND  Result Value Ref Range Status   Specimen Description BLOOD LEFT HAND  Final   Special Requests   Final    BOTTLES DRAWN AEROBIC AND ANAEROBIC Blood Culture adequate volume   Culture  Setup Time   Final    AEROBIC BOTTLE ONLY GRAM NEGATIVE RODS CRITICAL RESULT CALLED TO, READ BACK BY AND VERIFIED WITH: RN D [place] [id number] AT 0717 AM BY CM Performed at [facility], [address] [place], [place] [address]    Culture KLEBSIELLA PNEUMONIAE (A)  Final   Report Status 11/29/2020 FINAL  Final   Organism ID, Bacteria KLEBSIELLA PNEUMONIAE  Final      Susceptibility   Klebsiella pneumoniae - MIC*    AMPICILLIN >=32 RESISTANT Resistant     CEFAZOLIN <=4 SENSITIVE Sensitive     CEFEPIME <=0.12 SENSITIVE Sensitive     CEFTAZIDIME <=1 SENSITIVE Sensitive     CEFTRIAXONE <=0.25 SENSITIVE Sensitive     CIPROFLOXACIN <=0.25 SENSITIVE Sensitive     GENTAMICIN <=1 SENSITIVE Sensitive     IMIPENEM <=0.25 SENSITIVE Sensitive     TRIMETH/SULFA <=20 SENSITIVE Sensitive     AMPICILLIN/SULBACTAM 4 SENSITIVE Sensitive     PIP/TAZO <=4 SENSITIVE Sensitive     * KLEBSIELLA PNEUMONIAE  Blood Culture ID Panel (Reflexed)     Status: Abnormal   Collection Time: 11/26/20 10:21 AM  Result Value Ref Range Status   Enterococcus faecalis NOT DETECTED NOT DETECTED Final   Enterococcus Faecium NOT DETECTED NOT DETECTED Final   Listeria monocytogenes NOT DETECTED NOT DETECTED Final   Staphylococcus species NOT DETECTED NOT DETECTED Final   Staphylococcus aureus (BCID) NOT DETECTED NOT DETECTED Final    Staphylococcus epidermidis NOT DETECTED NOT DETECTED Final   Staphylococcus lugdunensis NOT DETECTED NOT DETECTED Final   Streptococcus species NOT DETECTED NOT DETECTED Final   Streptococcus agalactiae NOT DETECTED NOT DETECTED Final   Streptococcus pneumoniae NOT DETECTED NOT DETECTED Final   Streptococcus pyogenes NOT DETECTED NOT DETECTED Final   A.calcoaceticus-baumannii NOT DETECTED NOT DETECTED Final   Bacteroides fragilis NOT DETECTED NOT DETECTED Final  Enterobacterales DETECTED (A) NOT DETECTED Final    Comment: Enterobacterales represent a large order of gram negative bacteria, not a single organism. CRITICAL RESULT CALLED TO, READ BACK BY AND VERIFIED WITH: RN D [place] [id number] AT 3474 AM BY CM    Enterobacter cloacae complex NOT DETECTED NOT DETECTED Final   Escherichia coli NOT DETECTED NOT DETECTED Final   Klebsiella aerogenes NOT DETECTED NOT DETECTED Final   Klebsiella oxytoca NOT DETECTED NOT DETECTED Final   Klebsiella pneumoniae DETECTED (A) NOT DETECTED Final    Comment: CRITICAL RESULT CALLED TO, READ BACK BY AND VERIFIED WITH: RN D [place] [id number] AT 0717 AM BY CM    Proteus species NOT DETECTED NOT DETECTED Final   Salmonella species NOT DETECTED NOT DETECTED Final   Serratia marcescens NOT DETECTED NOT DETECTED Final   Haemophilus influenzae NOT DETECTED NOT DETECTED Final   Neisseria meningitidis NOT DETECTED NOT DETECTED Final   Pseudomonas aeruginosa NOT DETECTED NOT DETECTED Final   Stenotrophomonas maltophilia NOT DETECTED NOT DETECTED Final   Candida albicans NOT DETECTED NOT DETECTED Final   Candida auris NOT DETECTED NOT DETECTED Final   Candida glabrata NOT DETECTED NOT DETECTED Final   Candida krusei NOT DETECTED NOT DETECTED Final   Candida parapsilosis NOT DETECTED NOT DETECTED Final   Candida tropicalis NOT DETECTED NOT DETECTED Final   Cryptococcus neoformans/gattii NOT DETECTED NOT DETECTED Final   CTX-M ESBL NOT DETECTED NOT DETECTED  Final   Carbapenem resistance IMP NOT DETECTED NOT DETECTED Final   Carbapenem resistance KPC NOT DETECTED NOT DETECTED Final   Carbapenem resistance NDM NOT DETECTED NOT DETECTED Final   Carbapenem resist OXA 48 LIKE NOT DETECTED NOT DETECTED Final   Carbapenem resistance VIM NOT DETECTED NOT DETECTED Final    Comment: Performed at [facility], [address], [place], [place] [address]  Culture, blood (routine x 2)     Status: None   Collection Time: 11/26/20  5:00 PM   Specimen: BLOOD LEFT HAND  Result Value Ref Range Status   Specimen Description BLOOD LEFT HAND  Final   Special Requests   Final    BOTTLES DRAWN AEROBIC AND ANAEROBIC Blood Culture results may not be optimal due to an inadequate volume of blood received in culture bottles   Culture   Final    NO GROWTH 5 DAYS Performed at [facility], [place] [address], [place], [place] [address]    Report Status 12/01/2020 FINAL  Final  Culture, blood (routine x 2)     Status: None   Collection Time: 11/28/20 12:16 PM   Specimen: BLOOD RIGHT HAND  Result Value Ref Range Status   Specimen Description BLOOD RIGHT HAND  Final   Special Requests   Final    BOTTLES DRAWN AEROBIC ONLY Blood Culture adequate volume   Culture   Final    NO GROWTH 5 DAYS Performed at [facility], [place] [address], [place], [place] [address]    Report Status 12/03/2020 FINAL  Final  Culture, blood (routine x 2)     Status: None   Collection Time: 11/28/20 12:22 PM   Specimen: BLOOD RIGHT FOREARM  Result Value Ref Range Status   Specimen Description BLOOD RIGHT FOREARM  Final   Special Requests   Final    BOTTLES DRAWN AEROBIC ONLY Blood Culture adequate volume   Culture   Final    NO GROWTH 5 DAYS Performed at [facility], [place]  [address], [place], [place] [address]    Report Status 12/03/2020 FINAL  Final    Coagulation Studies: No results for input(s): LABPROT, INR in the last 72 hours.  Urinalysis: No results for  input(s): COLORURINE, LABSPEC, PHURINE, GLUCOSEU, HGBUR, BILIRUBINUR, KETONESUR, PROTEINUR, UROBILINOGEN, NITRITE, LEUKOCYTESUR in the last 72 hours.  Invalid input(s): APPERANCEUR    Imaging: No results found.   Medications:       Assessment/ Plan:  59 y.o. 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   1.  ESRD on HD.  Patient seen and evaluated at bedside.  Due for hemodialysis treatment today.  Orders have been prepared.  2.  Anemia of chronic kidney disease.   Lab Results  Component Value Date   HGB 7.7 (L) 12/14/2020   Continue Retacrit 10,000 units IV with dialysis treatments.  3.  Secondary hyperparathyroidism.  Awaiting repeat serum phosphorus today.  4.  Thrombocytopenia.  Platelets have normalized and currently 271,000.  5.  Hyponatremia.  Awaiting repeat serum sodium today.  6.  Leukocytosis/fever/sepsis.  Kleibsella noted in blood, abx management per primary team and ID.  WBC count currently 11.6..   LOS: 0 Theresa Russell 4/22/20227:59 AM

## 2020-12-17 LAB — RENAL FUNCTION PANEL
Albumin: 2.7 g/dL — ABNORMAL LOW (ref 3.5–5.0)
Anion gap: 13 (ref 5–15)
BUN: 39 mg/dL — ABNORMAL HIGH (ref 6–20)
CO2: 24 mmol/L (ref 22–32)
Calcium: 8.6 mg/dL — ABNORMAL LOW (ref 8.9–10.3)
Chloride: 102 mmol/L (ref 98–111)
Creatinine, Ser: 3.84 mg/dL — ABNORMAL HIGH (ref 0.44–1.00)
GFR, Estimated: 13 mL/min — ABNORMAL LOW (ref >60)
Glucose, Bld: 115 mg/dL — ABNORMAL HIGH (ref 70–99)
Phosphorus: 4.5 mg/dL (ref 2.5–4.6)
Potassium: 5 mmol/L (ref 3.5–5.1)
Sodium: 139 mmol/L (ref 135–145)

## 2020-12-17 LAB — CBC
HCT: 28.4 % — ABNORMAL LOW (ref 36.0–46.0)
Hemoglobin: 8.3 g/dL — ABNORMAL LOW (ref 12.0–15.0)
MCH: 29.5 pg (ref 26.0–34.0)
MCHC: 29.2 g/dL — ABNORMAL LOW (ref 30.0–36.0)
MCV: 101.1 fL — ABNORMAL HIGH (ref 80.0–100.0)
Platelets: 218 10*3/uL (ref 150–400)
RBC: 2.81 MIL/uL — ABNORMAL LOW (ref 3.87–5.11)
RDW: 21.1 % — ABNORMAL HIGH (ref 11.5–15.5)
WBC: 11.3 10*3/uL — ABNORMAL HIGH (ref 4.0–10.5)
nRBC: 1.6 % — ABNORMAL HIGH (ref 0.0–0.2)

## 2020-12-17 NOTE — Progress Notes (Signed)
[facility]  ROUNDING NOTE   Subjective:  Patient resting comfortably in bed. IR consult for PermCath placement today. Patient in good spirits today.    Objective:  Vital signs in last 24 hours:  Temperature 96.8 pulse 90 respirations 20 blood pressure 145/73   Physical Exam: General:  No acute distress  Head:  Normocephalic, atraumatic. Moist oral mucosal membranes  Eyes:  Anicteric  Neck:  Supple  Lungs:   Clear bilateral, normal effort  Heart:  S1S2 no rubs  Abdomen:   Soft, nontender, bowel sounds present  Extremities:  No lower extremity edema  Neurologic:  Awake, alert, conversant  Skin:  No acute rash  Access:  Right IJ temporary dialysis catheter in place    Basic Metabolic Panel: Recent Labs  Lab 12/12/20 0500 12/14/20 0629 12/17/20 0627  NA 137 137 139  K 5.1 4.9 5.0  CL 101 101 102  CO2 25 23 24   GLUCOSE 101* 109* 115*  BUN 34* 36* 39*  CREATININE 3.63* 3.60* 3.84*  CALCIUM 8.7* 8.6* 8.6*  PHOS 4.0 4.2 4.5    Liver Function Tests: Recent Labs  Lab 12/12/20 0500 12/14/20 0629 12/17/20 0627  ALBUMIN 2.5* 2.6* 2.7*   No results for input(s): LIPASE, AMYLASE in the last 168 hours. No results for input(s): AMMONIA in the last 168 hours.  CBC: Recent Labs  Lab 12/12/20 0500 12/14/20 0629 12/17/20 0627  WBC 9.3 11.6* 11.3*  HGB 8.6* 7.7* 8.3*  HCT 28.6* 26.2* 28.4*  MCV 99.0 99.6 101.1*  PLT 332 271 218    Cardiac Enzymes: No results for input(s): CKTOTAL, CKMB, CKMBINDEX, TROPONINI in the last 168 hours.  BNP: Invalid input(s): POCBNP  CBG: No results for input(s): GLUCAP in the last 168 hours.  Microbiology: Results for orders placed or performed during the hospital encounter of 10/16/20  Culture, blood (routine x 2)     Status: None   Collection Time: 10/31/20  8:54 AM   Specimen: BLOOD LEFT HAND  Result Value Ref Range Status   Specimen Description BLOOD LEFT HAND  Final   Special Requests   Final    BOTTLES  DRAWN AEROBIC AND ANAEROBIC Blood Culture results may not be optimal due to an inadequate volume of blood received in culture bottles   Culture   Final    NO GROWTH 5 DAYS Performed at [facility], [place] [address] [place], [place] [address]    Report Status 11/05/2020 FINAL  Final  Culture, blood (routine x 2)     Status: None   Collection Time: 10/31/20  9:48 AM   Specimen: BLOOD LEFT HAND  Result Value Ref Range Status   Specimen Description BLOOD LEFT HAND  Final   Special Requests   Final    BOTTLES DRAWN AEROBIC ONLY Blood Culture adequate volume   Culture   Final    NO GROWTH 5 DAYS Performed at [facility], [place] [address] [place], [place] [address]    Report Status 11/05/2020 FINAL  Final  Expectorated Sputum Assessment w Gram Stain, Rflx to Resp Cult     Status: None   Collection Time: 11/26/20  8:37 AM   Specimen: Sputum  Result Value Ref Range Status   Specimen Description SPUTUM  Final   Special Requests NONE  Final   Sputum evaluation   Final    THIS SPECIMEN IS ACCEPTABLE FOR SPUTUM CULTURE Performed at [facility], [place] [address] [place], [place] [address]  Report Status 11/27/2020 FINAL  Final  Culture, Respiratory w Gram Stain     Status: None   Collection Time: 11/26/20  8:37 AM   Specimen: SPU  Result Value Ref Range Status   Specimen Description SPUTUM  Final   Special Requests NONE Reflexed from [id number]  Final   Gram Stain   Final    ABUNDANT WBC PRESENT,BOTH PMN AND MONONUCLEAR FEW GRAM VARIABLE ROD RARE GRAM POSITIVE COCCI Performed at [facility], [place] [address], [place], [place] [address]    Culture RARE KLEBSIELLA PNEUMONIAE  Final   Report Status 11/29/2020 FINAL  Final   Organism ID, Bacteria KLEBSIELLA PNEUMONIAE  Final      Susceptibility   Klebsiella pneumoniae - MIC*    AMPICILLIN >=32 RESISTANT Resistant     CEFAZOLIN <=4 SENSITIVE Sensitive     CEFEPIME <=0.12 SENSITIVE Sensitive     CEFTAZIDIME <=1  SENSITIVE Sensitive     CEFTRIAXONE <=0.25 SENSITIVE Sensitive     CIPROFLOXACIN <=0.25 SENSITIVE Sensitive     GENTAMICIN <=1 SENSITIVE Sensitive     IMIPENEM <=0.25 SENSITIVE Sensitive     TRIMETH/SULFA <=20 SENSITIVE Sensitive     AMPICILLIN/SULBACTAM 4 SENSITIVE Sensitive     PIP/TAZO <=4 SENSITIVE Sensitive     * RARE KLEBSIELLA PNEUMONIAE  Culture, blood (routine x 2)     Status: Abnormal   Collection Time: 11/26/20 10:21 AM   Specimen: BLOOD LEFT HAND  Result Value Ref Range Status   Specimen Description BLOOD LEFT HAND  Final   Special Requests   Final    BOTTLES DRAWN AEROBIC AND ANAEROBIC Blood Culture adequate volume   Culture  Setup Time   Final    AEROBIC BOTTLE ONLY GRAM NEGATIVE RODS CRITICAL RESULT CALLED TO, READ BACK BY AND VERIFIED WITH: RN D [place] [id number] AT 0717 AM BY CM Performed at [facility], [address], [place], [place] [address]    Culture KLEBSIELLA PNEUMONIAE (A)  Final   Report Status 11/29/2020 FINAL  Final   Organism ID, Bacteria KLEBSIELLA PNEUMONIAE  Final      Susceptibility   Klebsiella pneumoniae - MIC*    AMPICILLIN >=32 RESISTANT Resistant     CEFAZOLIN <=4 SENSITIVE Sensitive     CEFEPIME <=0.12 SENSITIVE Sensitive     CEFTAZIDIME <=1 SENSITIVE Sensitive     CEFTRIAXONE <=0.25 SENSITIVE Sensitive     CIPROFLOXACIN <=0.25 SENSITIVE Sensitive     GENTAMICIN <=1 SENSITIVE Sensitive     IMIPENEM <=0.25 SENSITIVE Sensitive     TRIMETH/SULFA <=20 SENSITIVE Sensitive     AMPICILLIN/SULBACTAM 4 SENSITIVE Sensitive     PIP/TAZO <=4 SENSITIVE Sensitive     * KLEBSIELLA PNEUMONIAE  Blood Culture ID Panel (Reflexed)     Status: Abnormal   Collection Time: 11/26/20 10:21 AM  Result Value Ref Range Status   Enterococcus faecalis NOT DETECTED NOT DETECTED Final   Enterococcus Faecium NOT DETECTED NOT DETECTED Final   Listeria monocytogenes NOT DETECTED NOT DETECTED Final   Staphylococcus species NOT DETECTED NOT DETECTED Final    Staphylococcus aureus (BCID) NOT DETECTED NOT DETECTED Final   Staphylococcus epidermidis NOT DETECTED NOT DETECTED Final   Staphylococcus lugdunensis NOT DETECTED NOT DETECTED Final   Streptococcus species NOT DETECTED NOT DETECTED Final   Streptococcus agalactiae NOT DETECTED NOT DETECTED Final   Streptococcus pneumoniae NOT DETECTED NOT DETECTED Final   Streptococcus pyogenes NOT DETECTED NOT DETECTED Final   A.calcoaceticus-baumannii NOT DETECTED NOT DETECTED Final  Bacteroides fragilis NOT DETECTED NOT DETECTED Final   Enterobacterales DETECTED (A) NOT DETECTED Final    Comment: Enterobacterales represent a large order of gram negative bacteria, not a single organism. CRITICAL RESULT CALLED TO, READ BACK BY AND VERIFIED WITH: RN D [place] [id number] AT 2440 AM BY CM    Enterobacter cloacae complex NOT DETECTED NOT DETECTED Final   Escherichia coli NOT DETECTED NOT DETECTED Final   Klebsiella aerogenes NOT DETECTED NOT DETECTED Final   Klebsiella oxytoca NOT DETECTED NOT DETECTED Final   Klebsiella pneumoniae DETECTED (A) NOT DETECTED Final    Comment: CRITICAL RESULT CALLED TO, READ BACK BY AND VERIFIED WITH: RN D [place] [id number] AT 0717 AM BY CM    Proteus species NOT DETECTED NOT DETECTED Final   Salmonella species NOT DETECTED NOT DETECTED Final   Serratia marcescens NOT DETECTED NOT DETECTED Final   Haemophilus influenzae NOT DETECTED NOT DETECTED Final   Neisseria meningitidis NOT DETECTED NOT DETECTED Final   Pseudomonas aeruginosa NOT DETECTED NOT DETECTED Final   Stenotrophomonas maltophilia NOT DETECTED NOT DETECTED Final   Candida albicans NOT DETECTED NOT DETECTED Final   Candida auris NOT DETECTED NOT DETECTED Final   Candida glabrata NOT DETECTED NOT DETECTED Final   Candida krusei NOT DETECTED NOT DETECTED Final   Candida parapsilosis NOT DETECTED NOT DETECTED Final   Candida tropicalis NOT DETECTED NOT DETECTED Final   Cryptococcus neoformans/gattii NOT  DETECTED NOT DETECTED Final   CTX-M ESBL NOT DETECTED NOT DETECTED Final   Carbapenem resistance IMP NOT DETECTED NOT DETECTED Final   Carbapenem resistance KPC NOT DETECTED NOT DETECTED Final   Carbapenem resistance NDM NOT DETECTED NOT DETECTED Final   Carbapenem resist OXA 48 LIKE NOT DETECTED NOT DETECTED Final   Carbapenem resistance VIM NOT DETECTED NOT DETECTED Final    Comment: Performed at [facility], [address], [place], [place] [address]  Culture, blood (routine x 2)     Status: None   Collection Time: 11/26/20  5:00 PM   Specimen: BLOOD LEFT HAND  Result Value Ref Range Status   Specimen Description BLOOD LEFT HAND  Final   Special Requests   Final    BOTTLES DRAWN AEROBIC AND ANAEROBIC Blood Culture results may not be optimal due to an inadequate volume of blood received in culture bottles   Culture   Final    NO GROWTH 5 DAYS Performed at [facility], [place] [address], [place], [place] [address]    Report Status 12/01/2020 FINAL  Final  Culture, blood (routine x 2)     Status: None   Collection Time: 11/28/20 12:16 PM   Specimen: BLOOD RIGHT HAND  Result Value Ref Range Status   Specimen Description BLOOD RIGHT HAND  Final   Special Requests   Final    BOTTLES DRAWN AEROBIC ONLY Blood Culture adequate volume   Culture   Final    NO GROWTH 5 DAYS Performed at [facility], [place] [address], [place], [place] [address]    Report Status 12/03/2020 FINAL  Final  Culture, blood (routine x 2)     Status: None   Collection Time: 11/28/20 12:22 PM   Specimen: BLOOD RIGHT FOREARM  Result Value Ref Range Status   Specimen Description BLOOD RIGHT FOREARM  Final   Special Requests   Final    BOTTLES DRAWN AEROBIC ONLY Blood Culture adequate volume   Culture   Final    NO GROWTH 5 DAYS  Performed at [facility], [place] [address] [place], [place] [address]    Report Status 12/03/2020 FINAL  Final    Coagulation Studies: No results for input(s):  LABPROT, INR in the last 72 hours.  Urinalysis: No results for input(s): COLORURINE, LABSPEC, PHURINE, GLUCOSEU, HGBUR, BILIRUBINUR, KETONESUR, PROTEINUR, UROBILINOGEN, NITRITE, LEUKOCYTESUR in the last 72 hours.  Invalid input(s): APPERANCEUR    Imaging: No results found.   Medications:       Assessment/ Plan:  61 y.o. 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   1.  ESRD on HD.  Patient due for hemodialysis treatment later today.  In addition PermCath this to be placed soon as well.  IR consult pending.  2.  Anemia of chronic kidney disease.   Lab Results  Component Value Date   HGB 8.3 (L) 12/17/2020   Hemoglobin currently 8.3.  Maintain the patient on Retacrit 10,070 with dialysis.  3.  Secondary hyperparathyroidism.  Serum phosphorus at target of 4.5.  Continue to periodically monitor.  4.  Thrombocytopenia.  Currently platelets are 218,000 and within normal range.  5.  Hyponatremia.  Resolved as serum sodium currently 139.  6.  Leukocytosis/fever/sepsis.  Patient with prior Klebsiella sepsis that has been treated..   LOS: 0 Theresa Russell 4/25/20227:58 AM

## 2020-12-18 NOTE — Consult Note (Signed)
Chief Complaint: Patient was seen in consultation today for tunneled dialysis catheter placement at the request of Dr Driscilla Moats   Supervising Physician: Mir, Sharen Heck  Patient Status: [facility]  History of Present Illness: Theresa Russell is a 60 y.o. 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  Medications: Prior to Admission medications   Not on File     No family history on file.  Social History   Socioeconomic History  . Marital status: Single    Spouse name: Not on file  . Number of children: Not on file  . Years of education: Not on file  . Highest education level: Not on file  Occupational History  . Not on file  Tobacco Use  . Smoking status: Not on file  . Smokeless tobacco: Not on file  Substance and Sexual Activity  . Alcohol use: Not on file  . Drug use: Not on file  . Sexual activity: Not on file  Other Topics Concern  . Not on file  Social History Narrative  . Not on file   Social Determinants of Health   Financial Resource Strain: Not on file  Food Insecurity: Not on file  Transportation Needs: Not on file  Physical Activity: Not on file  Stress: Not on file  Social Connections: Not on file     Review of Systems: A 12 point ROS discussed and pertinent positives are indicated in the HPI above.  All other systems are negative.   Vital Signs: There were no vitals taken for this visit.  Physical Exam Vitals reviewed.  HENT:     Mouth/Throat:     Mouth: Mucous membranes are moist.  Cardiovascular:     Rate and Rhythm: Normal rate and regular  rhythm.     Heart sounds: Normal heart sounds.  Pulmonary:     Effort: Pulmonary effort is normal.     Breath sounds: Normal breath sounds.  Abdominal:     Palpations: Abdomen is soft.  Musculoskeletal:        General: Normal range of motion.  Skin:    General: Skin is warm.  Neurological:     Mental Status: She is alert and oriented to person, place, and time.  Psychiatric:        Behavior: Behavior normal.     Imaging: IR Fluoro Guide CV Line Right  Result Date: 12/17/2020 INDICATION: 60 year old female referred for temporary hemodialysis catheter EXAM: ULTRASOUND-GUIDED ACCESS RIGHT IJ PLACEMENT OF NON TUNNELED HEMODIALYSIS CATHETER MEDICATIONS: None ANESTHESIA/SEDATION: None FLUOROSCOPY TIME:  Fluoroscopy Time: 0 minutes 12 seconds (5 mGy). COMPLICATIONS: None PROCEDURE: Informed written consent was obtained from the patient after a discussion of the risks, benefits, and alternatives to treatment. Questions regarding the procedure were encouraged and answered. The right neck was prepped with chlorhexidine in a sterile fashion, and a sterile drape was applied covering the operative field. Maximum barrier sterile technique with sterile gowns and gloves were used for the procedure. A timeout was performed prior to the initiation of the procedure. A micropuncture kit was utilized to access the right  internal jugular vein under direct, real-time ultrasound guidance after the overlying soft tissues were anesthetized with 1% lidocaine with epinephrine. Ultrasound image documentation was performed. The microwire was kinked to measure appropriate catheter length. A stiff glidewire was advanced to the level of the IVC. A 20 cm hemodialysis catheter was then placed over the wire. Final catheter positioning was confirmed and documented with a spot radiographic image. The catheter aspirates and flushes normally. The catheter was flushed with appropriate volume heparin dwells. Dressings were applied. The  patient tolerated the procedure well without immediate post procedural complication. . IMPRESSION: Status post right IJ temporary hemodialysis catheter. Signed, Dulcy Fanny. Dellia Nims, RPVI [facility] Radiology Electronically Signed   By: Corrie Mckusick D.O.   On: 12/17/2020 09:06   IR Removal Tun Cv Cath W/O FL  Result Date: 11/27/2020 INDICATION: Bacteremia.  Request removal of tunneled hemodialysis catheter. EXAM: REMOVAL OF TUNNELED RIGHT IJ HEMODIALYSIS CATHETER MEDICATIONS: None COMPLICATIONS: None immediate. PROCEDURE: Informed written consent was obtained from the patient following an explanation of the procedure, risks, benefits and alternatives to treatment. A time out was performed prior to the initiation of the procedure. Maximal barrier sterile technique was utilized including caps, mask, sterile gowns, sterile gloves, large sterile drape, hand hygiene, and chlorhexidine. Utilizing a combination of blunt dissection and gentle traction, the catheter was removed intact. Hemostasis was obtained with manual compression. A dressing was placed. The patient tolerated the procedure well without immediate post procedural complication. IMPRESSION: Successful removal of tunneled right IJ dialysis catheter. Read by: Ascencion Dike PA-C Electronically Signed   By: Corrie Mckusick D.O.   On: 11/27/2020 14:33   IR US Guide Vasc Access Right  Result Date: 12/17/2020 INDICATION: 60 year old female referred for temporary hemodialysis catheter EXAM: ULTRASOUND-GUIDED ACCESS RIGHT IJ PLACEMENT OF NON TUNNELED HEMODIALYSIS CATHETER MEDICATIONS: None ANESTHESIA/SEDATION: None FLUOROSCOPY TIME:  Fluoroscopy Time: 0 minutes 12 seconds (5 mGy). COMPLICATIONS: None PROCEDURE: Informed written consent was obtained from the patient after a discussion of the risks, benefits, and alternatives to treatment. Questions regarding the procedure were encouraged and answered. The right neck  was prepped with chlorhexidine in a sterile fashion, and a sterile drape was applied covering the operative field. Maximum barrier sterile technique with sterile gowns and gloves were used for the procedure. A timeout was performed prior to the initiation of the procedure. A micropuncture kit was utilized to access the right internal jugular vein under direct, real-time ultrasound guidance after the overlying soft tissues were anesthetized with 1% lidocaine with epinephrine. Ultrasound image documentation was performed. The microwire was kinked to measure appropriate catheter length. A stiff glidewire was advanced to the level of the IVC. A 20 cm hemodialysis catheter was then placed over the wire. Final catheter positioning was confirmed and documented with a spot radiographic image. The catheter aspirates and flushes normally. The catheter was flushed with appropriate volume heparin dwells. Dressings were applied. The patient tolerated the procedure well without immediate post procedural complication. . IMPRESSION: Status post right IJ temporary hemodialysis catheter. Signed, Dulcy Fanny. Dellia Nims, RPVI [facility] Radiology Electronically Signed   By: Corrie Mckusick D.O.   On: 12/17/2020 09:06   DG Chest Port 1 View  Result Date: 11/26/2020 CLINICAL DATA:  Fever, shortness of breath this morning EXAM: PORTABLE CHEST 1 VIEW COMPARISON:  Portable exam 0851 hours compared to 10/31/2020 FINDINGS: RIGHT jugular dual-lumen central venous catheter with tip projecting over SVC. Enlargement of cardiac silhouette  with pulmonary vascular congestion. Atherosclerotic calcification aorta. Increased consolidation in BILATERAL lower lobes greater on RIGHT. No definite pleural effusion or pneumothorax. Osseous structures unremarkable. IMPRESSION: BILATERAL lower lobe consolidation greater on RIGHT, increased since prior study. Aortic Atherosclerosis (ICD10-I70.0). Electronically  Signed   By: Lavonia Dana M.D.   On: 11/26/2020 08:58   DG Abd Portable 1V  Result Date: 11/29/2020 CLINICAL DATA:  NG tube placement EXAM: PORTABLE ABDOMEN - 1 VIEW COMPARISON:  10/16/2020 FINDINGS: NG tube is in the stomach. Relative paucity of gas. No evidence of bowel obstruction. IMPRESSION: NG tube in the mid stomach. Electronically Signed   By: Rolm Baptise M.D.   On: 11/29/2020 01:32   ECHOCARDIOGRAM COMPLETE  Result Date: 12/05/2020    ECHOCARDIOGRAM REPORT   Patient Name:   Theresa Russell Date of Exam: 11/30/2020 Medical Rec #:  [id number]           Height: Accession #:    [id number]          Weight: Date of Birth:  06-Sep-1960          BSA: Patient Age:    69 years            BP:           133/61 mmHg Patient Gender: F                   HR:           81 bpm. Exam Location:  Inpatient Procedure: 2D Echo, Cardiac Doppler and Color Doppler Indications:     Bacteremia R78.81  History:         Patient has prior history of Echocardiogram examinations, most                  recent 10/19/2020. Arrythmias:Atrial Flutter. On BiPap. End                  stage renal disease. Anemia. Thrombocytopenia. Thyroid disease.                  Fever. Sepsis.  Sonographer:     Darlina Sicilian RDCS Referring Phys:  [place] Diagnosing Phys: Dixie Dials MD IMPRESSIONS  1. Left ventricular ejection fraction, by estimation, is 65 to 70%. The left ventricle has normal function. Left ventricular endocardial border not optimally defined to evaluate regional wall motion. There is mild concentric left ventricular hypertrophy. Left ventricular diastolic parameters are indeterminate.  2. Right ventricular systolic function is moderately reduced. The right ventricular size is moderately enlarged. There is severely elevated pulmonary artery systolic pressure.  3. Left atrial size was severely dilated.  4. Right atrial size was moderately dilated.  5. The mitral valve is normal in structure. Moderate mitral valve  regurgitation.  6. Tricuspid valve regurgitation is moderate to severe.  7. The aortic valve is tricuspid. There is mild calcification of the aortic valve. Aortic valve regurgitation is trivial. Mild aortic valve sclerosis is present, with no evidence of aortic valve stenosis.  8. The inferior vena cava is dilated in size with <50% respiratory variability, suggesting right atrial pressure of 15 mmHg. FINDINGS  Left Ventricle: Left ventricular ejection fraction, by estimation, is 65 to 70%. The left ventricle has normal function. Left ventricular endocardial border not optimally defined to evaluate regional wall motion. The left ventricular internal cavity size was normal in size. There is mild concentric left ventricular hypertrophy. Left ventricular diastolic parameters are indeterminate. Right Ventricle: The right ventricular  size is moderately enlarged. No increase in right ventricular wall thickness. Right ventricular systolic function is moderately reduced. There is severely elevated pulmonary artery systolic pressure. The tricuspid regurgitant velocity is 3.61 m/s, and with an assumed right atrial pressure of 15 mmHg, the estimated right ventricular systolic pressure is 16.8 mmHg. Left Atrium: Left atrial size was severely dilated. Right Atrium: Right atrial size was moderately dilated. Pericardium: There is no evidence of pericardial effusion. Mitral Valve: The mitral valve is normal in structure. Moderate mitral valve regurgitation. Tricuspid Valve: The tricuspid valve is normal in structure. Tricuspid valve regurgitation is moderate to severe. Aortic Valve: The aortic valve is tricuspid. There is mild calcification of the aortic valve. There is mild aortic valve annular calcification. Aortic valve regurgitation is trivial. Mild aortic valve sclerosis is present, with no evidence of aortic valve stenosis. Pulmonic Valve: The pulmonic valve was normal in structure. Pulmonic valve regurgitation is mild. Aorta:  The aortic root is normal in size and structure. There is minimal (Grade I) atheroma plaque involving the aortic root and ascending aorta. Venous: The inferior vena cava is dilated in size with less than 50% respiratory variability, suggesting right atrial pressure of 15 mmHg. IAS/Shunts: No atrial level shunt detected by color flow Doppler.  LEFT VENTRICLE PLAX 2D LVIDd:         4.60 cm LVIDs:         2.70 cm LV PW:         1.20 cm LV IVS:        1.20 cm LVOT diam:     2.20 cm LV SV:         32 LVOT Area:     3.80 cm  LEFT ATRIUM             RIGHT ATRIUM LA diam:        4.30 cm RA Area:     17.60 cm LA Vol (A2C):   61.3 ml RA Volume:   48.00 ml LA Vol (A4C):   74.1 ml LA Biplane Vol: 72.2 ml  AORTIC VALVE LVOT Vmax:   56.20 cm/s LVOT Vmean:  37.300 cm/s LVOT VTI:    0.084 m  AORTA Ao Root diam: 2.80 cm Ao Asc diam:  3.50 cm TRICUSPID VALVE TR Peak grad:   52.1 mmHg TR Vmax:        361.00 cm/s  SHUNTS Systemic VTI:  0.08 m Systemic Diam: 2.20 cm Dixie Dials MD Electronically signed by Dixie Dials MD Signature Date/Time: 12/05/2020/9:08:39 AM    Final     Labs:  CBC: Recent Labs    12/10/20 0642 12/12/20 0500 12/14/20 0629 12/17/20 0627  WBC 9.3 9.3 11.6* 11.3*  HGB 8.3* 8.6* 7.7* 8.3*  HCT 27.3* 28.6* 26.2* 28.4*  PLT 358 332 271 218    COAGS: Recent Labs    10/21/20 0910  INR 1.3*  APTT 37*    BMP: Recent Labs    12/10/20 0642 12/12/20 0500 12/14/20 0629 12/17/20 0627  NA 135 137 137 139  K 4.6 5.1 4.9 5.0  CL 101 101 101 102  CO2 _0 GLUCOSE 140* 101* 109* 115*  BUN 41* 34* 36* 39*  CALCIUM 8.8* 8.7* 8.6* 8.6*  CREATININE 4.09* 3.63* 3.60* 3.84*  GFRNONAA 12* 14* 14* 13*    LIVER FUNCTION TESTS: Recent Labs    10/21/20 0910 10/22/20 0447 10/23/20 0431 10/27/20 0416 10/29/20 0544 11/02/20 0547 11/05/20 0533 12/10/20 3729 12/12/20 0500 12/14/20 0629 12/17/20  0627  BILITOT 5.1* 5.3*  --  3.3*  --  2.6*  --   --   --   --   --   AST  --  53*  --   86*  --  39  --   --   --   --   --   ALT  --  60*  --  114*  --  74*  --   --   --   --   --   ALKPHOS  --  203*  --  272*  --  293*  --   --   --   --   --   PROT  --  8.3*  --  7.3  --  6.3*  --   --   --   --   --   ALBUMIN  --  1.9*  2.0*   < > 2.4*   < > 2.8*  2.8*   < > 2.7* 2.5* 2.6* 2.7*   < > = values in this interval not displayed.    TUMOR MARKERS: No results for input(s): AFPTM, CEA, CA199, CHROMGRNA in the last 8760 hours.  Assessment and Plan:  ESRD Tunn dial cath removed 11/27/20 - bacteremia BC 4/6: no growth afeb For tunn cath new placement in IR 4/27--- unless in dialysis-- can perform 4/28 if need. Risks and benefits discussed with the patient including, but not limited to bleeding, infection, vascular injury, pneumothorax which may require chest tube placement, air embolism or even death  All of the patient's questions were answered, patient is agreeable to proceed. Consent signed and in chart.   Thank you for this interesting consult.  I greatly enjoyed meeting Theresa Russell and look forward to participating in their care.  A copy of this report was sent to the requesting provider on this date.  Electronically Signed: Lavonia Drafts, PA-C 12/18/2020, 2:05 PM   I spent a total of 20 Minutes    in face to face in clinical consultation, greater than 50% of which was counseling/coordinating care for tunneled dialysis catheter placement

## 2020-12-19 ENCOUNTER — Other Ambulatory Visit (HOSPITAL_COMMUNITY): Payer: Medicare Other

## 2020-12-19 HISTORY — PX: IR US GUIDE VASC ACCESS RIGHT: IMG2390

## 2020-12-19 HISTORY — PX: IR PERC TUN PERIT CATH WO PORT S&I /IMAG: IMG2327

## 2020-12-19 HISTORY — PX: IR FLUORO GUIDE CV LINE RIGHT: IMG2283

## 2020-12-19 LAB — CBC
HCT: 30.1 % — ABNORMAL LOW (ref 36.0–46.0)
Hemoglobin: 8.6 g/dL — ABNORMAL LOW (ref 12.0–15.0)
MCH: 29.3 pg (ref 26.0–34.0)
MCHC: 28.6 g/dL — ABNORMAL LOW (ref 30.0–36.0)
MCV: 102.4 fL — ABNORMAL HIGH (ref 80.0–100.0)
Platelets: 176 10*3/uL (ref 150–400)
RBC: 2.94 MIL/uL — ABNORMAL LOW (ref 3.87–5.11)
RDW: 20.8 % — ABNORMAL HIGH (ref 11.5–15.5)
WBC: 9 10*3/uL (ref 4.0–10.5)
nRBC: 0.6 % — ABNORMAL HIGH (ref 0.0–0.2)

## 2020-12-19 LAB — RENAL FUNCTION PANEL
Albumin: 2.7 g/dL — ABNORMAL LOW (ref 3.5–5.0)
Anion gap: 16 — ABNORMAL HIGH (ref 5–15)
BUN: 39 mg/dL — ABNORMAL HIGH (ref 6–20)
CO2: 24 mmol/L (ref 22–32)
Calcium: 8.4 mg/dL — ABNORMAL LOW (ref 8.9–10.3)
Chloride: 101 mmol/L (ref 98–111)
Creatinine, Ser: 3.78 mg/dL — ABNORMAL HIGH (ref 0.44–1.00)
GFR, Estimated: 13 mL/min — ABNORMAL LOW (ref >60)
Glucose, Bld: 73 mg/dL (ref 70–99)
Phosphorus: 4.9 mg/dL — ABNORMAL HIGH (ref 2.5–4.6)
Potassium: 5.2 mmol/L — ABNORMAL HIGH (ref 3.5–5.1)
Sodium: 141 mmol/L (ref 135–145)

## 2020-12-19 LAB — PROTIME-INR
INR: 1.2 (ref 0.8–1.2)
Prothrombin Time: 15.2 seconds (ref 11.4–15.2)

## 2020-12-19 MED ORDER — MIDAZOLAM HCL 2 MG/2ML IJ SOLN
INTRAMUSCULAR | Status: AC | PRN
  Administered 2020-12-19: 1 mg via INTRAVENOUS

## 2020-12-19 MED ORDER — CEFAZOLIN SODIUM-DEXTROSE 2-4 GM/100ML-% IV SOLN
2.0000 g | Freq: Once | INTRAVENOUS | Status: AC
  Administered 2020-12-19: 2 g via INTRAVENOUS

## 2020-12-19 MED ORDER — FENTANYL CITRATE (PF) 100 MCG/2ML IJ SOLN
INTRAMUSCULAR | Status: AC | PRN
  Administered 2020-12-19: 25 ug via INTRAVENOUS

## 2020-12-19 NOTE — Procedures (Signed)
Interventional Radiology Procedure Note  Procedure: RT IJ TUNNELED HD CATH INSERTION RT IJ TEMP CATH REMOVAL    Complications: None  Estimated Blood Loss:  MIN  Findings: TIP SVCRA    Tamera Punt, MD

## 2020-12-19 NOTE — Progress Notes (Signed)
[facility]  ROUNDING NOTE   Subjective:  Patient seen and evaluated during hemodialysis treatment.  Tolerating well. Resting comfortably at the moment.  Objective:  Vital signs in last 24 hours:  Temperature 98 pulse 90 respirations 28 blood pressure 150/63   Physical Exam: General:  No acute distress  Head:  Normocephalic, atraumatic. Moist oral mucosal membranes  Eyes:  Anicteric  Neck:  Supple  Lungs:   Clear bilateral, normal effort  Heart:  S1S2 no rubs  Abdomen:   Soft, nontender, bowel sounds present  Extremities:  No lower extremity edema  Neurologic:  Awake, alert, conversant  Skin:  No acute rash  Access:  Right IJ temporary dialysis catheter in place    Basic Metabolic Panel: Recent Labs  Lab 12/14/20 0629 12/17/20 0627 12/19/20 0613  NA 137 139 141  K 4.9 5.0 5.2*  CL 101 102 101  CO2 23 24 24   GLUCOSE 109* 115* 73  BUN 36* 39* 39*  CREATININE 3.60* 3.84* 3.78*  CALCIUM 8.6* 8.6* 8.4*  PHOS 4.2 4.5 4.9*    Liver Function Tests: Recent Labs  Lab 12/14/20 0629 12/17/20 0627 12/19/20 0613  ALBUMIN 2.6* 2.7* 2.7*   No results for input(s): LIPASE, AMYLASE in the last 168 hours. No results for input(s): AMMONIA in the last 168 hours.  CBC: Recent Labs  Lab 12/14/20 0629 12/17/20 0627 12/19/20 0613  WBC 11.6* 11.3* 9.0  HGB 7.7* 8.3* 8.6*  HCT 26.2* 28.4* 30.1*  MCV 99.6 101.1* 102.4*  PLT 271 218 176    Cardiac Enzymes: No results for input(s): CKTOTAL, CKMB, CKMBINDEX, TROPONINI in the last 168 hours.  BNP: Invalid input(s): POCBNP  CBG: No results for input(s): GLUCAP in the last 168 hours.  Microbiology: Results for orders placed or performed during the hospital encounter of 10/16/20  Culture, blood (routine x 2)     Status: None   Collection Time: 10/31/20  8:54 AM   Specimen: BLOOD LEFT HAND  Result Value Ref Range Status   Specimen Description BLOOD LEFT HAND  Final   Special Requests   Final    BOTTLES  DRAWN AEROBIC AND ANAEROBIC Blood Culture results may not be optimal due to an inadequate volume of blood received in culture bottles   Culture   Final    NO GROWTH 5 DAYS Performed at [facility], [place] [address], [place], [place] [address]    Report Status 11/05/2020 FINAL  Final  Culture, blood (routine x 2)     Status: None   Collection Time: 10/31/20  9:48 AM   Specimen: BLOOD LEFT HAND  Result Value Ref Range Status   Specimen Description BLOOD LEFT HAND  Final   Special Requests   Final    BOTTLES DRAWN AEROBIC ONLY Blood Culture adequate volume   Culture   Final    NO GROWTH 5 DAYS Performed at [facility], [place] [address] [place] [address], [place], [place] [address]    Report Status 11/05/2020 FINAL  Final  Expectorated Sputum Assessment w Gram Stain, Rflx to Resp Cult     Status: None   Collection Time: 11/26/20  8:37 AM   Specimen: Sputum  Result Value Ref Range Status   Specimen Description SPUTUM  Final   Special Requests NONE  Final   Sputum evaluation   Final    THIS SPECIMEN IS ACCEPTABLE FOR SPUTUM CULTURE Performed at [facility], [place] [address] [place], [place] [address]    Report  Status 11/27/2020 FINAL  Final  Culture, Respiratory w Gram Stain     Status: None   Collection Time: 11/26/20  8:37 AM   Specimen: SPU  Result Value Ref Range Status   Specimen Description SPUTUM  Final   Special Requests NONE Reflexed from [id number]  Final   Gram Stain   Final    ABUNDANT WBC PRESENT,BOTH PMN AND MONONUCLEAR FEW GRAM VARIABLE ROD RARE GRAM POSITIVE COCCI Performed at [facility], [place] [address] [place], [place] [address]    Culture RARE KLEBSIELLA PNEUMONIAE  Final   Report Status 11/29/2020 FINAL  Final   Organism ID, Bacteria KLEBSIELLA PNEUMONIAE  Final      Susceptibility   Klebsiella pneumoniae - MIC*    AMPICILLIN >=32 RESISTANT Resistant     CEFAZOLIN <=4 SENSITIVE Sensitive     CEFEPIME <=0.12 SENSITIVE Sensitive     CEFTAZIDIME <=1  SENSITIVE Sensitive     CEFTRIAXONE <=0.25 SENSITIVE Sensitive     CIPROFLOXACIN <=0.25 SENSITIVE Sensitive     GENTAMICIN <=1 SENSITIVE Sensitive     IMIPENEM <=0.25 SENSITIVE Sensitive     TRIMETH/SULFA <=20 SENSITIVE Sensitive     AMPICILLIN/SULBACTAM 4 SENSITIVE Sensitive     PIP/TAZO <=4 SENSITIVE Sensitive     * RARE KLEBSIELLA PNEUMONIAE  Culture, blood (routine x 2)     Status: Abnormal   Collection Time: 11/26/20 10:21 AM   Specimen: BLOOD LEFT HAND  Result Value Ref Range Status   Specimen Description BLOOD LEFT HAND  Final   Special Requests   Final    BOTTLES DRAWN AEROBIC AND ANAEROBIC Blood Culture adequate volume   Culture  Setup Time   Final    AEROBIC BOTTLE ONLY GRAM NEGATIVE RODS CRITICAL RESULT CALLED TO, READ BACK BY AND VERIFIED WITH: RN D [place] [id number] AT 0717 AM BY CM Performed at [facility], [address], [place], [place] [address]    Culture KLEBSIELLA PNEUMONIAE (A)  Final   Report Status 11/29/2020 FINAL  Final   Organism ID, Bacteria KLEBSIELLA PNEUMONIAE  Final      Susceptibility   Klebsiella pneumoniae - MIC*    AMPICILLIN >=32 RESISTANT Resistant     CEFAZOLIN <=4 SENSITIVE Sensitive     CEFEPIME <=0.12 SENSITIVE Sensitive     CEFTAZIDIME <=1 SENSITIVE Sensitive     CEFTRIAXONE <=0.25 SENSITIVE Sensitive     CIPROFLOXACIN <=0.25 SENSITIVE Sensitive     GENTAMICIN <=1 SENSITIVE Sensitive     IMIPENEM <=0.25 SENSITIVE Sensitive     TRIMETH/SULFA <=20 SENSITIVE Sensitive     AMPICILLIN/SULBACTAM 4 SENSITIVE Sensitive     PIP/TAZO <=4 SENSITIVE Sensitive     * KLEBSIELLA PNEUMONIAE  Blood Culture ID Panel (Reflexed)     Status: Abnormal   Collection Time: 11/26/20 10:21 AM  Result Value Ref Range Status   Enterococcus faecalis NOT DETECTED NOT DETECTED Final   Enterococcus Faecium NOT DETECTED NOT DETECTED Final   Listeria monocytogenes NOT DETECTED NOT DETECTED Final   Staphylococcus species NOT DETECTED NOT DETECTED Final    Staphylococcus aureus (BCID) NOT DETECTED NOT DETECTED Final   Staphylococcus epidermidis NOT DETECTED NOT DETECTED Final   Staphylococcus lugdunensis NOT DETECTED NOT DETECTED Final   Streptococcus species NOT DETECTED NOT DETECTED Final   Streptococcus agalactiae NOT DETECTED NOT DETECTED Final   Streptococcus pneumoniae NOT DETECTED NOT DETECTED Final   Streptococcus pyogenes NOT DETECTED NOT DETECTED Final   A.calcoaceticus-baumannii NOT DETECTED NOT DETECTED Final  Bacteroides fragilis NOT DETECTED NOT DETECTED Final   Enterobacterales DETECTED (A) NOT DETECTED Final    Comment: Enterobacterales represent a large order of gram negative bacteria, not a single organism. CRITICAL RESULT CALLED TO, READ BACK BY AND VERIFIED WITH: RN D [place] [id number] AT 8250 AM BY CM    Enterobacter cloacae complex NOT DETECTED NOT DETECTED Final   Escherichia coli NOT DETECTED NOT DETECTED Final   Klebsiella aerogenes NOT DETECTED NOT DETECTED Final   Klebsiella oxytoca NOT DETECTED NOT DETECTED Final   Klebsiella pneumoniae DETECTED (A) NOT DETECTED Final    Comment: CRITICAL RESULT CALLED TO, READ BACK BY AND VERIFIED WITH: RN D [place] [id number] AT 0717 AM BY CM    Proteus species NOT DETECTED NOT DETECTED Final   Salmonella species NOT DETECTED NOT DETECTED Final   Serratia marcescens NOT DETECTED NOT DETECTED Final   Haemophilus influenzae NOT DETECTED NOT DETECTED Final   Neisseria meningitidis NOT DETECTED NOT DETECTED Final   Pseudomonas aeruginosa NOT DETECTED NOT DETECTED Final   Stenotrophomonas maltophilia NOT DETECTED NOT DETECTED Final   Candida albicans NOT DETECTED NOT DETECTED Final   Candida auris NOT DETECTED NOT DETECTED Final   Candida glabrata NOT DETECTED NOT DETECTED Final   Candida krusei NOT DETECTED NOT DETECTED Final   Candida parapsilosis NOT DETECTED NOT DETECTED Final   Candida tropicalis NOT DETECTED NOT DETECTED Final   Cryptococcus neoformans/gattii NOT  DETECTED NOT DETECTED Final   CTX-M ESBL NOT DETECTED NOT DETECTED Final   Carbapenem resistance IMP NOT DETECTED NOT DETECTED Final   Carbapenem resistance KPC NOT DETECTED NOT DETECTED Final   Carbapenem resistance NDM NOT DETECTED NOT DETECTED Final   Carbapenem resist OXA 48 LIKE NOT DETECTED NOT DETECTED Final   Carbapenem resistance VIM NOT DETECTED NOT DETECTED Final    Comment: Performed at [facility], [address], [place], [place] [address]  Culture, blood (routine x 2)     Status: None   Collection Time: 11/26/20  5:00 PM   Specimen: BLOOD LEFT HAND  Result Value Ref Range Status   Specimen Description BLOOD LEFT HAND  Final   Special Requests   Final    BOTTLES DRAWN AEROBIC AND ANAEROBIC Blood Culture results may not be optimal due to an inadequate volume of blood received in culture bottles   Culture   Final    NO GROWTH 5 DAYS Performed at [facility], [place] [address] [place], [place] [address]    Report Status 12/01/2020 FINAL  Final  Culture, blood (routine x 2)     Status: None   Collection Time: 11/28/20 12:16 PM   Specimen: BLOOD RIGHT HAND  Result Value Ref Range Status   Specimen Description BLOOD RIGHT HAND  Final   Special Requests   Final    BOTTLES DRAWN AEROBIC ONLY Blood Culture adequate volume   Culture   Final    NO GROWTH 5 DAYS Performed at [facility], [place] [address] [place] [address] [place], [place] [address]    Report Status 12/03/2020 FINAL  Final  Culture, blood (routine x 2)     Status: None   Collection Time: 11/28/20 12:22 PM   Specimen: BLOOD RIGHT FOREARM  Result Value Ref Range Status   Specimen Description BLOOD RIGHT FOREARM  Final   Special Requests   Final    BOTTLES DRAWN AEROBIC ONLY Blood Culture adequate volume   Culture   Final    NO GROWTH 5 DAYS  Performed at [facility], [place] [address] [place], [place] [address]    Report Status 12/03/2020 FINAL  Final    Coagulation Studies: Recent Labs     12/19/20 0613  LABPROT 15.2  INR 1.2    Urinalysis: No results for input(s): COLORURINE, LABSPEC, PHURINE, GLUCOSEU, HGBUR, BILIRUBINUR, KETONESUR, PROTEINUR, UROBILINOGEN, NITRITE, LEUKOCYTESUR in the last 72 hours.  Invalid input(s): APPERANCEUR    Imaging: No results found.   Medications:       Assessment/ Plan:  60 y.o. 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   1.  ESRD on HD.  Patient seen and evaluated during dialysis treatment.  Tolerating well.  We will plan to complete dialysis treatment today.  2.  Anemia of chronic kidney disease.   Lab Results  Component Value Date   HGB 8.6 (L) 12/19/2020   Continue Retacrit 10,000 units IV with dialysis.  3.  Secondary hyperparathyroidism.  Phosphorus currently 4.9.  Continue to monitor trend.  4.  Thrombocytopenia.  Platelets down a bit to 176,000 but still within normal range.  5.  Hyponatremia.  Resolved.  6.  Leukocytosis/fever/sepsis.  Patient with prior Klebsiella sepsis that has been treated.   LOS: 0 Theresa Russell 4/27/20222:55 PM

## 2020-12-20 ENCOUNTER — Encounter (HOSPITAL_COMMUNITY): Payer: Self-pay

## 2020-12-21 LAB — RENAL FUNCTION PANEL
Albumin: 2.7 g/dL — ABNORMAL LOW (ref 3.5–5.0)
Anion gap: 14 (ref 5–15)
BUN: 33 mg/dL — ABNORMAL HIGH (ref 6–20)
CO2: 26 mmol/L (ref 22–32)
Calcium: 8.6 mg/dL — ABNORMAL LOW (ref 8.9–10.3)
Chloride: 101 mmol/L (ref 98–111)
Creatinine, Ser: 3.66 mg/dL — ABNORMAL HIGH (ref 0.44–1.00)
GFR, Estimated: 14 mL/min — ABNORMAL LOW (ref >60)
Glucose, Bld: 106 mg/dL — ABNORMAL HIGH (ref 70–99)
Phosphorus: 4.5 mg/dL (ref 2.5–4.6)
Potassium: 4.9 mmol/L (ref 3.5–5.1)
Sodium: 141 mmol/L (ref 135–145)

## 2020-12-21 LAB — CBC
HCT: 31.1 % — ABNORMAL LOW (ref 36.0–46.0)
Hemoglobin: 9 g/dL — ABNORMAL LOW (ref 12.0–15.0)
MCH: 29.3 pg (ref 26.0–34.0)
MCHC: 28.9 g/dL — ABNORMAL LOW (ref 30.0–36.0)
MCV: 101.3 fL — ABNORMAL HIGH (ref 80.0–100.0)
Platelets: 147 10*3/uL — ABNORMAL LOW (ref 150–400)
RBC: 3.07 MIL/uL — ABNORMAL LOW (ref 3.87–5.11)
RDW: 19.3 % — ABNORMAL HIGH (ref 11.5–15.5)
WBC: 5.8 10*3/uL (ref 4.0–10.5)
nRBC: 0 % (ref 0.0–0.2)

## 2020-12-21 NOTE — Progress Notes (Signed)
[facility]  ROUNDING NOTE   Subjective:  New right IJ PermCath placed. Patient in good spirits today. Objective:  Vital signs in last 24 hours:  Temperature 96.7 pulse 87 respirations 20 blood pressure 153/82    Physical Exam: General:  No acute distress  Head:  Normocephalic, atraumatic. Moist oral mucosal membranes  Eyes:  Anicteric  Neck:  Supple  Lungs:   Clear bilateral, normal effort  Heart:  S1S2 no rubs  Abdomen:   Soft, nontender, bowel sounds present  Extremities:  No lower extremity edema  Neurologic:  Awake, alert, conversant  Skin:  No acute rash  Access:  Right IJ PermCath in place    Basic Metabolic Panel: Recent Labs  Lab 12/17/20 0627 12/19/20 0613 12/21/20 0555  NA 139 141 141  K 5.0 5.2* 4.9  CL 102 101 101  CO2 24 24 26   GLUCOSE 115* 73 106*  BUN 39* 39* 33*  CREATININE 3.84* 3.78* 3.66*  CALCIUM 8.6* 8.4* 8.6*  PHOS 4.5 4.9* 4.5    Liver Function Tests: Recent Labs  Lab 12/17/20 0627 12/19/20 0613 12/21/20 0555  ALBUMIN 2.7* 2.7* 2.7*   No results for input(s): LIPASE, AMYLASE in the last 168 hours. No results for input(s): AMMONIA in the last 168 hours.  CBC: Recent Labs  Lab 12/17/20 0627 12/19/20 0613 12/21/20 0555  WBC 11.3* 9.0 5.8  HGB 8.3* 8.6* 9.0*  HCT 28.4* 30.1* 31.1*  MCV 101.1* 102.4* 101.3*  PLT 218 176 147*    Cardiac Enzymes: No results for input(s): CKTOTAL, CKMB, CKMBINDEX, TROPONINI in the last 168 hours.  BNP: Invalid input(s): POCBNP  CBG: No results for input(s): GLUCAP in the last 168 hours.  Microbiology: Results for orders placed or performed during the hospital encounter of 10/16/20  Culture, blood (routine x 2)     Status: None   Collection Time: 10/31/20  8:54 AM   Specimen: BLOOD LEFT HAND  Result Value Ref Range Status   Specimen Description BLOOD LEFT HAND  Final   Special Requests   Final    BOTTLES DRAWN AEROBIC AND ANAEROBIC Blood Culture results may not be optimal  due to an inadequate volume of blood received in culture bottles   Culture   Final    NO GROWTH 5 DAYS Performed at [facility], [place] [address] [place], [place] [address]    Report Status 11/05/2020 FINAL  Final  Culture, blood (routine x 2)     Status: None   Collection Time: 10/31/20  9:48 AM   Specimen: BLOOD LEFT HAND  Result Value Ref Range Status   Specimen Description BLOOD LEFT HAND  Final   Special Requests   Final    BOTTLES DRAWN AEROBIC ONLY Blood Culture adequate volume   Culture   Final    NO GROWTH 5 DAYS Performed at [facility], [place] [address] [place], [place] [address]    Report Status 11/05/2020 FINAL  Final  Expectorated Sputum Assessment w Gram Stain, Rflx to Resp Cult     Status: None   Collection Time: 11/26/20  8:37 AM   Specimen: Sputum  Result Value Ref Range Status   Specimen Description SPUTUM  Final   Special Requests NONE  Final   Sputum evaluation   Final    THIS SPECIMEN IS ACCEPTABLE FOR SPUTUM CULTURE Performed at [facility], [place] [address] [place], [place] [address]    Report Status 11/27/2020 FINAL  Final  Culture,  Respiratory w Gram Stain     Status: None   Collection Time: 11/26/20  8:37 AM   Specimen: SPU  Result Value Ref Range Status   Specimen Description SPUTUM  Final   Special Requests NONE Reflexed from [id number]  Final   Gram Stain   Final    ABUNDANT WBC PRESENT,BOTH PMN AND MONONUCLEAR FEW GRAM VARIABLE ROD RARE GRAM POSITIVE COCCI Performed at [facility], [place] [address], [place], [place] [address]    Culture RARE KLEBSIELLA PNEUMONIAE  Final   Report Status 11/29/2020 FINAL  Final   Organism ID, Bacteria KLEBSIELLA PNEUMONIAE  Final      Susceptibility   Klebsiella pneumoniae - MIC*    AMPICILLIN >=32 RESISTANT Resistant     CEFAZOLIN <=4 SENSITIVE Sensitive     CEFEPIME <=0.12 SENSITIVE Sensitive     CEFTAZIDIME <=1 SENSITIVE Sensitive     CEFTRIAXONE <=0.25 SENSITIVE Sensitive      CIPROFLOXACIN <=0.25 SENSITIVE Sensitive     GENTAMICIN <=1 SENSITIVE Sensitive     IMIPENEM <=0.25 SENSITIVE Sensitive     TRIMETH/SULFA <=20 SENSITIVE Sensitive     AMPICILLIN/SULBACTAM 4 SENSITIVE Sensitive     PIP/TAZO <=4 SENSITIVE Sensitive     * RARE KLEBSIELLA PNEUMONIAE  Culture, blood (routine x 2)     Status: Abnormal   Collection Time: 11/26/20 10:21 AM   Specimen: BLOOD LEFT HAND  Result Value Ref Range Status   Specimen Description BLOOD LEFT HAND  Final   Special Requests   Final    BOTTLES DRAWN AEROBIC AND ANAEROBIC Blood Culture adequate volume   Culture  Setup Time   Final    AEROBIC BOTTLE ONLY GRAM NEGATIVE RODS CRITICAL RESULT CALLED TO, READ BACK BY AND VERIFIED WITH: RN D [place] [id number] AT 0717 AM BY CM Performed at [facility], [address], [place], [place] [address]    Culture KLEBSIELLA PNEUMONIAE (A)  Final   Report Status 11/29/2020 FINAL  Final   Organism ID, Bacteria KLEBSIELLA PNEUMONIAE  Final      Susceptibility   Klebsiella pneumoniae - MIC*    AMPICILLIN >=32 RESISTANT Resistant     CEFAZOLIN <=4 SENSITIVE Sensitive     CEFEPIME <=0.12 SENSITIVE Sensitive     CEFTAZIDIME <=1 SENSITIVE Sensitive     CEFTRIAXONE <=0.25 SENSITIVE Sensitive     CIPROFLOXACIN <=0.25 SENSITIVE Sensitive     GENTAMICIN <=1 SENSITIVE Sensitive     IMIPENEM <=0.25 SENSITIVE Sensitive     TRIMETH/SULFA <=20 SENSITIVE Sensitive     AMPICILLIN/SULBACTAM 4 SENSITIVE Sensitive     PIP/TAZO <=4 SENSITIVE Sensitive     * KLEBSIELLA PNEUMONIAE  Blood Culture ID Panel (Reflexed)     Status: Abnormal   Collection Time: 11/26/20 10:21 AM  Result Value Ref Range Status   Enterococcus faecalis NOT DETECTED NOT DETECTED Final   Enterococcus Faecium NOT DETECTED NOT DETECTED Final   Listeria monocytogenes NOT DETECTED NOT DETECTED Final   Staphylococcus species NOT DETECTED NOT DETECTED Final   Staphylococcus aureus (BCID) NOT DETECTED NOT DETECTED Final    Staphylococcus epidermidis NOT DETECTED NOT DETECTED Final   Staphylococcus lugdunensis NOT DETECTED NOT DETECTED Final   Streptococcus species NOT DETECTED NOT DETECTED Final   Streptococcus agalactiae NOT DETECTED NOT DETECTED Final   Streptococcus pneumoniae NOT DETECTED NOT DETECTED Final   Streptococcus pyogenes NOT DETECTED NOT DETECTED Final   A.calcoaceticus-baumannii NOT DETECTED NOT DETECTED Final   Bacteroides fragilis NOT DETECTED NOT DETECTED Final  Enterobacterales DETECTED (A) NOT DETECTED Final    Comment: Enterobacterales represent a large order of gram negative bacteria, not a single organism. CRITICAL RESULT CALLED TO, READ BACK BY AND VERIFIED WITH: RN D [place] [id number] AT 0454 AM BY CM    Enterobacter cloacae complex NOT DETECTED NOT DETECTED Final   Escherichia coli NOT DETECTED NOT DETECTED Final   Klebsiella aerogenes NOT DETECTED NOT DETECTED Final   Klebsiella oxytoca NOT DETECTED NOT DETECTED Final   Klebsiella pneumoniae DETECTED (A) NOT DETECTED Final    Comment: CRITICAL RESULT CALLED TO, READ BACK BY AND VERIFIED WITH: RN D [place] [id number] AT 0717 AM BY CM    Proteus species NOT DETECTED NOT DETECTED Final   Salmonella species NOT DETECTED NOT DETECTED Final   Serratia marcescens NOT DETECTED NOT DETECTED Final   Haemophilus influenzae NOT DETECTED NOT DETECTED Final   Neisseria meningitidis NOT DETECTED NOT DETECTED Final   Pseudomonas aeruginosa NOT DETECTED NOT DETECTED Final   Stenotrophomonas maltophilia NOT DETECTED NOT DETECTED Final   Candida albicans NOT DETECTED NOT DETECTED Final   Candida auris NOT DETECTED NOT DETECTED Final   Candida glabrata NOT DETECTED NOT DETECTED Final   Candida krusei NOT DETECTED NOT DETECTED Final   Candida parapsilosis NOT DETECTED NOT DETECTED Final   Candida tropicalis NOT DETECTED NOT DETECTED Final   Cryptococcus neoformans/gattii NOT DETECTED NOT DETECTED Final   CTX-M ESBL NOT DETECTED NOT DETECTED  Final   Carbapenem resistance IMP NOT DETECTED NOT DETECTED Final   Carbapenem resistance KPC NOT DETECTED NOT DETECTED Final   Carbapenem resistance NDM NOT DETECTED NOT DETECTED Final   Carbapenem resist OXA 48 LIKE NOT DETECTED NOT DETECTED Final   Carbapenem resistance VIM NOT DETECTED NOT DETECTED Final    Comment: Performed at [facility], [address], [place], [place] [address]  Culture, blood (routine x 2)     Status: None   Collection Time: 11/26/20  5:00 PM   Specimen: BLOOD LEFT HAND  Result Value Ref Range Status   Specimen Description BLOOD LEFT HAND  Final   Special Requests   Final    BOTTLES DRAWN AEROBIC AND ANAEROBIC Blood Culture results may not be optimal due to an inadequate volume of blood received in culture bottles   Culture   Final    NO GROWTH 5 DAYS Performed at [facility], [place] [address], [place], [place] [address]    Report Status 12/01/2020 FINAL  Final  Culture, blood (routine x 2)     Status: None   Collection Time: 11/28/20 12:16 PM   Specimen: BLOOD RIGHT HAND  Result Value Ref Range Status   Specimen Description BLOOD RIGHT HAND  Final   Special Requests   Final    BOTTLES DRAWN AEROBIC ONLY Blood Culture adequate volume   Culture   Final    NO GROWTH 5 DAYS Performed at [facility], [place] [address] [place], [place] [address]    Report Status 12/03/2020 FINAL  Final  Culture, blood (routine x 2)     Status: None   Collection Time: 11/28/20 12:22 PM   Specimen: BLOOD RIGHT FOREARM  Result Value Ref Range Status   Specimen Description BLOOD RIGHT FOREARM  Final   Special Requests   Final    BOTTLES DRAWN AEROBIC ONLY Blood Culture adequate volume   Culture   Final    NO GROWTH 5 DAYS Performed at [facility], [place]  [address], [place], [place] [address]    Report Status 12/03/2020 FINAL  Final    Coagulation Studies: Recent Labs    12/19/20 0613  LABPROT 15.2  INR 1.2    Urinalysis: No results for  input(s): COLORURINE, LABSPEC, PHURINE, GLUCOSEU, HGBUR, BILIRUBINUR, KETONESUR, PROTEINUR, UROBILINOGEN, NITRITE, LEUKOCYTESUR in the last 72 hours.  Invalid input(s): APPERANCEUR    Imaging: No results found.   Medications:       Assessment/ Plan:  60 y.o. 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   1.  ESRD on HD.  Patient due for hemodialysis treatment today.  Orders have been prepared.  2.  Anemia of chronic kidney disease.   Lab Results  Component Value Date   HGB 9.0 (L) 12/21/2020   Hemoglobin up to 9.0.  Maintain the patient on Retacrit 10,000 IV with dialysis.  3.  Secondary hyperparathyroidism.  Phosphorus rechecked today and currently 4.5 and at target.  4.  Thrombocytopenia.  Platelets down again today to 147,000.  Has periodic thrombocytopenia.  5.  Hyponatremia.  Resolved.  6.  Leukocytosis/fever/sepsis.  Patient with prior Klebsiella sepsis that has been treated.   LOS: 0 Jakoby Melendrez 4/29/20224:32 PM

## 2020-12-23 LAB — SARS CORONAVIRUS 2 (TAT 6-24 HRS): SARS Coronavirus 2: NEGATIVE

## 2020-12-24 LAB — CBC
HCT: 29.4 % — ABNORMAL LOW (ref 36.0–46.0)
Hemoglobin: 8.4 g/dL — ABNORMAL LOW (ref 12.0–15.0)
MCH: 29.3 pg (ref 26.0–34.0)
MCHC: 28.6 g/dL — ABNORMAL LOW (ref 30.0–36.0)
MCV: 102.4 fL — ABNORMAL HIGH (ref 80.0–100.0)
Platelets: 162 10*3/uL (ref 150–400)
RBC: 2.87 MIL/uL — ABNORMAL LOW (ref 3.87–5.11)
RDW: 18.1 % — ABNORMAL HIGH (ref 11.5–15.5)
WBC: 7.7 10*3/uL (ref 4.0–10.5)
nRBC: 0.4 % — ABNORMAL HIGH (ref 0.0–0.2)

## 2020-12-24 LAB — RENAL FUNCTION PANEL
Albumin: 2.5 g/dL — ABNORMAL LOW (ref 3.5–5.0)
Anion gap: 11 (ref 5–15)
BUN: 50 mg/dL — ABNORMAL HIGH (ref 6–20)
CO2: 25 mmol/L (ref 22–32)
Calcium: 8.5 mg/dL — ABNORMAL LOW (ref 8.9–10.3)
Chloride: 105 mmol/L (ref 98–111)
Creatinine, Ser: 4.34 mg/dL — ABNORMAL HIGH (ref 0.44–1.00)
GFR, Estimated: 11 mL/min — ABNORMAL LOW (ref >60)
Glucose, Bld: 175 mg/dL — ABNORMAL HIGH (ref 70–99)
Phosphorus: 3.6 mg/dL (ref 2.5–4.6)
Potassium: 5.3 mmol/L — ABNORMAL HIGH (ref 3.5–5.1)
Sodium: 141 mmol/L (ref 135–145)

## 2020-12-24 NOTE — Progress Notes (Signed)
[facility]  ROUNDING NOTE   Subjective:   Patient seen and evaluated during hemodialysis treatment. Tolerating well. Ultrafiltration target 1.5 kg.  Objective:  Vital signs in last 24 hours:  Temperature 96.4 pulse 64 respirations 18 blood pressure 134/68  Physical Exam: General:  No acute distress  Head:  Normocephalic, atraumatic. Moist oral mucosal membranes  Eyes:  Anicteric  Neck:  Supple  Lungs:   Clear bilateral, normal effort  Heart:  S1S2 no rubs  Abdomen:   Soft, nontender, bowel sounds present  Extremities:  No lower extremity edema  Neurologic:  Awake, alert, conversant  Skin:  No acute rash  Access:  Right IJ PermCath in place    Basic Metabolic Panel: Recent Labs  Lab 12/19/20 0613 12/21/20 0555 12/24/20 0500  NA 141 141 141  K 5.2* 4.9 5.3*  CL 101 101 105  CO2 24 26 25   GLUCOSE 73 106* 175*  BUN 39* 33* 50*  CREATININE 3.78* 3.66* 4.34*  CALCIUM 8.4* 8.6* 8.5*  PHOS 4.9* 4.5 3.6    Liver Function Tests: Recent Labs  Lab 12/19/20 0613 12/21/20 0555 12/24/20 0500  ALBUMIN 2.7* 2.7* 2.5*   No results for input(s): LIPASE, AMYLASE in the last 168 hours. No results for input(s): AMMONIA in the last 168 hours.  CBC: Recent Labs  Lab 12/19/20 0613 12/21/20 0555 12/24/20 0500  WBC 9.0 5.8 7.7  HGB 8.6* 9.0* 8.4*  HCT 30.1* 31.1* 29.4*  MCV 102.4* 101.3* 102.4*  PLT 176 147* 162    Cardiac Enzymes: No results for input(s): CKTOTAL, CKMB, CKMBINDEX, TROPONINI in the last 168 hours.  BNP: Invalid input(s): POCBNP  CBG: No results for input(s): GLUCAP in the last 168 hours.  Microbiology: Results for orders placed or performed during the hospital encounter of 10/16/20  Culture, blood (routine x 2)     Status: None   Collection Time: 10/31/20  8:54 AM   Specimen: BLOOD LEFT HAND  Result Value Ref Range Status   Specimen Description BLOOD LEFT HAND  Final   Special Requests   Final    BOTTLES DRAWN AEROBIC AND  ANAEROBIC Blood Culture results may not be optimal due to an inadequate volume of blood received in culture bottles   Culture   Final    NO GROWTH 5 DAYS Performed at [facility], [place] [address], [place], [place] [address]    Report Status 11/05/2020 FINAL  Final  Culture, blood (routine x 2)     Status: None   Collection Time: 10/31/20  9:48 AM   Specimen: BLOOD LEFT HAND  Result Value Ref Range Status   Specimen Description BLOOD LEFT HAND  Final   Special Requests   Final    BOTTLES DRAWN AEROBIC ONLY Blood Culture adequate volume   Culture   Final    NO GROWTH 5 DAYS Performed at [facility], [place] [address], [place], [place] [address]    Report Status 11/05/2020 FINAL  Final  Expectorated Sputum Assessment w Gram Stain, Rflx to Resp Cult     Status: None   Collection Time: 11/26/20  8:37 AM   Specimen: Sputum  Result Value Ref Range Status   Specimen Description SPUTUM  Final   Special Requests NONE  Final   Sputum evaluation   Final    THIS SPECIMEN IS ACCEPTABLE FOR SPUTUM CULTURE Performed at [facility], [place] [address], [place], [place] [address]    Report Status 11/27/2020 FINAL  Final  Culture, Respiratory w Gram Stain     Status: None   Collection Time: 11/26/20  8:37 AM   Specimen: SPU  Result Value Ref Range Status   Specimen Description SPUTUM  Final   Special Requests NONE Reflexed from [id number]  Final   Gram Stain   Final    ABUNDANT WBC PRESENT,BOTH PMN AND MONONUCLEAR FEW GRAM VARIABLE ROD RARE GRAM POSITIVE COCCI Performed at [facility], [place] [address] [place], [place] [address]    Culture RARE KLEBSIELLA PNEUMONIAE  Final   Report Status 11/29/2020 FINAL  Final   Organism ID, Bacteria KLEBSIELLA PNEUMONIAE  Final      Susceptibility   Klebsiella pneumoniae - MIC*    AMPICILLIN >=32 RESISTANT Resistant     CEFAZOLIN <=4 SENSITIVE Sensitive     CEFEPIME <=0.12 SENSITIVE Sensitive     CEFTAZIDIME <=1 SENSITIVE Sensitive      CEFTRIAXONE <=0.25 SENSITIVE Sensitive     CIPROFLOXACIN <=0.25 SENSITIVE Sensitive     GENTAMICIN <=1 SENSITIVE Sensitive     IMIPENEM <=0.25 SENSITIVE Sensitive     TRIMETH/SULFA <=20 SENSITIVE Sensitive     AMPICILLIN/SULBACTAM 4 SENSITIVE Sensitive     PIP/TAZO <=4 SENSITIVE Sensitive     * RARE KLEBSIELLA PNEUMONIAE  Culture, blood (routine x 2)     Status: Abnormal   Collection Time: 11/26/20 10:21 AM   Specimen: BLOOD LEFT HAND  Result Value Ref Range Status   Specimen Description BLOOD LEFT HAND  Final   Special Requests   Final    BOTTLES DRAWN AEROBIC AND ANAEROBIC Blood Culture adequate volume   Culture  Setup Time   Final    AEROBIC BOTTLE ONLY GRAM NEGATIVE RODS CRITICAL RESULT CALLED TO, READ BACK BY AND VERIFIED WITH: RN D [place] [id number] AT 0717 AM BY CM Performed at [facility], [address] [place], [place] [address]    Culture KLEBSIELLA PNEUMONIAE (A)  Final   Report Status 11/29/2020 FINAL  Final   Organism ID, Bacteria KLEBSIELLA PNEUMONIAE  Final      Susceptibility   Klebsiella pneumoniae - MIC*    AMPICILLIN >=32 RESISTANT Resistant     CEFAZOLIN <=4 SENSITIVE Sensitive     CEFEPIME <=0.12 SENSITIVE Sensitive     CEFTAZIDIME <=1 SENSITIVE Sensitive     CEFTRIAXONE <=0.25 SENSITIVE Sensitive     CIPROFLOXACIN <=0.25 SENSITIVE Sensitive     GENTAMICIN <=1 SENSITIVE Sensitive     IMIPENEM <=0.25 SENSITIVE Sensitive     TRIMETH/SULFA <=20 SENSITIVE Sensitive     AMPICILLIN/SULBACTAM 4 SENSITIVE Sensitive     PIP/TAZO <=4 SENSITIVE Sensitive     * KLEBSIELLA PNEUMONIAE  Blood Culture ID Panel (Reflexed)     Status: Abnormal   Collection Time: 11/26/20 10:21 AM  Result Value Ref Range Status   Enterococcus faecalis NOT DETECTED NOT DETECTED Final   Enterococcus Faecium NOT DETECTED NOT DETECTED Final   Listeria monocytogenes NOT DETECTED NOT DETECTED Final   Staphylococcus species NOT DETECTED NOT DETECTED Final   Staphylococcus aureus  (BCID) NOT DETECTED NOT DETECTED Final   Staphylococcus epidermidis NOT DETECTED NOT DETECTED Final   Staphylococcus lugdunensis NOT DETECTED NOT DETECTED Final   Streptococcus species NOT DETECTED NOT DETECTED Final   Streptococcus agalactiae NOT DETECTED NOT DETECTED Final   Streptococcus pneumoniae NOT DETECTED NOT DETECTED Final   Streptococcus pyogenes NOT DETECTED NOT DETECTED Final   A.calcoaceticus-baumannii NOT DETECTED NOT DETECTED Final   Bacteroides fragilis NOT DETECTED  NOT DETECTED Final   Enterobacterales DETECTED (A) NOT DETECTED Final    Comment: Enterobacterales represent a large order of gram negative bacteria, not a single organism. CRITICAL RESULT CALLED TO, READ BACK BY AND VERIFIED WITH: RN D [place] [id number] AT 0947 AM BY CM    Enterobacter cloacae complex NOT DETECTED NOT DETECTED Final   Escherichia coli NOT DETECTED NOT DETECTED Final   Klebsiella aerogenes NOT DETECTED NOT DETECTED Final   Klebsiella oxytoca NOT DETECTED NOT DETECTED Final   Klebsiella pneumoniae DETECTED (A) NOT DETECTED Final    Comment: CRITICAL RESULT CALLED TO, READ BACK BY AND VERIFIED WITH: RN D [place] [id number] AT 0717 AM BY CM    Proteus species NOT DETECTED NOT DETECTED Final   Salmonella species NOT DETECTED NOT DETECTED Final   Serratia marcescens NOT DETECTED NOT DETECTED Final   Haemophilus influenzae NOT DETECTED NOT DETECTED Final   Neisseria meningitidis NOT DETECTED NOT DETECTED Final   Pseudomonas aeruginosa NOT DETECTED NOT DETECTED Final   Stenotrophomonas maltophilia NOT DETECTED NOT DETECTED Final   Candida albicans NOT DETECTED NOT DETECTED Final   Candida auris NOT DETECTED NOT DETECTED Final   Candida glabrata NOT DETECTED NOT DETECTED Final   Candida krusei NOT DETECTED NOT DETECTED Final   Candida parapsilosis NOT DETECTED NOT DETECTED Final   Candida tropicalis NOT DETECTED NOT DETECTED Final   Cryptococcus neoformans/gattii NOT DETECTED NOT DETECTED  Final   CTX-M ESBL NOT DETECTED NOT DETECTED Final   Carbapenem resistance IMP NOT DETECTED NOT DETECTED Final   Carbapenem resistance KPC NOT DETECTED NOT DETECTED Final   Carbapenem resistance NDM NOT DETECTED NOT DETECTED Final   Carbapenem resist OXA 48 LIKE NOT DETECTED NOT DETECTED Final   Carbapenem resistance VIM NOT DETECTED NOT DETECTED Final    Comment: Performed at [facility], [address], [place], [place] [address]  Culture, blood (routine x 2)     Status: None   Collection Time: 11/26/20  5:00 PM   Specimen: BLOOD LEFT HAND  Result Value Ref Range Status   Specimen Description BLOOD LEFT HAND  Final   Special Requests   Final    BOTTLES DRAWN AEROBIC AND ANAEROBIC Blood Culture results may not be optimal due to an inadequate volume of blood received in culture bottles   Culture   Final    NO GROWTH 5 DAYS Performed at [facility], [place] [address] [place], [place] [address]    Report Status 12/01/2020 FINAL  Final  Culture, blood (routine x 2)     Status: None   Collection Time: 11/28/20 12:16 PM   Specimen: BLOOD RIGHT HAND  Result Value Ref Range Status   Specimen Description BLOOD RIGHT HAND  Final   Special Requests   Final    BOTTLES DRAWN AEROBIC ONLY Blood Culture adequate volume   Culture   Final    NO GROWTH 5 DAYS Performed at [facility], [place] [address], [place], [place] [address]    Report Status 12/03/2020 FINAL  Final  Culture, blood (routine x 2)     Status: None   Collection Time: 11/28/20 12:22 PM   Specimen: BLOOD RIGHT FOREARM  Result Value Ref Range Status   Specimen Description BLOOD RIGHT FOREARM  Final   Special Requests   Final    BOTTLES DRAWN AEROBIC ONLY Blood Culture adequate volume   Culture   Final    NO GROWTH 5 DAYS Performed at [facility], [place] [address] [place], [place] [address]    Report Status 12/03/2020 FINAL  Final  SARS CORONAVIRUS 2 (TAT 6-24 HRS) Nasopharyngeal Nasopharyngeal Swab      Status: None   Collection Time: 12/23/20  7:54 AM   Specimen: Nasopharyngeal Swab  Result Value Ref Range Status   SARS Coronavirus 2 NEGATIVE NEGATIVE Final    Comment: (NOTE) SARS-CoV-2 target nucleic acids are NOT DETECTED.  The SARS-CoV-2 RNA is generally detectable in upper and lower respiratory specimens during the acute phase of infection. Negative results do not preclude SARS-CoV-2 infection, do not rule out co-infections with other pathogens, and should not be used as the sole basis for treatment or other patient management decisions. Negative results must be combined with clinical observations, patient history, and epidemiological information. The expected result is Negative.  Fact Sheet for Patients: SugarRoll.be  Fact Sheet for Healthcare Providers: https://www.woods-mathews.com/  This test is not yet approved or cleared by the [place] FDA and  has been authorized for detection and/or diagnosis of SARS-CoV-2 by FDA under an Emergency Use Authorization (EUA). This EUA will remain  in effect (meaning this test can be used) for the duration of the COVID-19 declaration under Se ction 564(b)(1) of the Act, 21 U.S.C. section 360bbb-3(b)(1), unless the authorization is terminated or revoked sooner.  Performed at [facility], [place] [address] [place], [place] [address]     Coagulation Studies: No results for input(s): LABPROT, INR in the last 72 hours.  Urinalysis: No results for input(s): COLORURINE, LABSPEC, PHURINE, GLUCOSEU, HGBUR, BILIRUBINUR, KETONESUR, PROTEINUR, UROBILINOGEN, NITRITE, LEUKOCYTESUR in the last 72 hours.  Invalid input(s): APPERANCEUR    Imaging: No results found.   Medications:       Assessment/ Plan:  60 y.o. female with a PMHx of ESRD with a right IJ PermCath in place, left ischio rectal fossa necrotizing fasciitis status post surgical debridement, sepsis, metabolic  encephalopathy, acute respiratory failure, anemia of chronic kidney disease, atrial flutter with RVR, diabetes mellitus type 2, dysphagia, chronic systolic heart failure, who was admitted to [facility] on 10/16/2020 for ongoing care.   1.  ESRD on HD.  Patient seen during dialysis treatment.  Tolerating well.  Ultrafiltration target 1.5 kg.  2.  Anemia of chronic kidney disease.   Lab Results  Component Value Date   HGB 8.4 (L) 12/24/2020   Hemoglobin down slightly to 8.4.  Continue Retacrit 10,000 units IV with dialysis.  3.  Secondary hyperparathyroidism.  Phosphorus 3.6 and acceptable.  4.  Thrombocytopenia.  Platelets improved again and currently 162,000.  5.  Hyponatremia.  Resolved.  6.  Leukocytosis/fever/sepsis.  Patient with prior Klebsiella sepsis that has been treated.  WBC count currently 7.7.   LOS: 0 Theresa Russell 5/2/20228:19 AM

## 2020-12-31 ENCOUNTER — Inpatient Hospital Stay
Admission: AD | Admit: 2020-12-31 | Payer: Medicare Other | Source: Other Acute Inpatient Hospital | Admitting: Family Medicine

## 2021-01-01 ENCOUNTER — Inpatient Hospital Stay (HOSPITAL_COMMUNITY)
Admission: AD | Admit: 2021-01-01 | Discharge: 2021-01-09 | DRG: 853 | Disposition: A | Discharge status: Other Acute Inpatient Hospital | Payer: Medicare Other | Source: Other Acute Inpatient Hospital | Attending: Pulmonary Disease | Admitting: Pulmonary Disease

## 2021-01-01 ENCOUNTER — Encounter (HOSPITAL_COMMUNITY): Payer: Self-pay | Admitting: Pulmonary Disease

## 2021-01-01 DIAGNOSIS — Z6833 Body mass index (BMI) 33.0-33.9, adult: Secondary | ICD-10-CM | POA: Diagnosis not present

## 2021-01-01 DIAGNOSIS — I4891 Unspecified atrial fibrillation: Secondary | ICD-10-CM | POA: Diagnosis present

## 2021-01-01 DIAGNOSIS — Z95828 Presence of other vascular implants and grafts: Secondary | ICD-10-CM

## 2021-01-01 DIAGNOSIS — A4159 Other Gram-negative sepsis: Secondary | ICD-10-CM | POA: Diagnosis present

## 2021-01-01 DIAGNOSIS — E1165 Type 2 diabetes mellitus with hyperglycemia: Secondary | ICD-10-CM | POA: Diagnosis present

## 2021-01-01 DIAGNOSIS — L89314 Pressure ulcer of right buttock, stage 4: Secondary | ICD-10-CM | POA: Diagnosis present

## 2021-01-01 DIAGNOSIS — E8770 Fluid overload, unspecified: Secondary | ICD-10-CM | POA: Diagnosis present

## 2021-01-01 DIAGNOSIS — I5022 Chronic systolic (congestive) heart failure: Secondary | ICD-10-CM | POA: Diagnosis present

## 2021-01-01 DIAGNOSIS — R6521 Severe sepsis with septic shock: Secondary | ICD-10-CM | POA: Diagnosis present

## 2021-01-01 DIAGNOSIS — A419 Sepsis, unspecified organism: Secondary | ICD-10-CM | POA: Diagnosis not present

## 2021-01-01 DIAGNOSIS — Z888 Allergy status to other drugs, medicaments and biological substances status: Secondary | ICD-10-CM

## 2021-01-01 DIAGNOSIS — E1122 Type 2 diabetes mellitus with diabetic chronic kidney disease: Secondary | ICD-10-CM | POA: Diagnosis present

## 2021-01-01 DIAGNOSIS — Z992 Dependence on renal dialysis: Secondary | ICD-10-CM

## 2021-01-01 DIAGNOSIS — G9341 Metabolic encephalopathy: Secondary | ICD-10-CM | POA: Diagnosis present

## 2021-01-01 DIAGNOSIS — I132 Hypertensive heart and chronic kidney disease with heart failure and with stage 5 chronic kidney disease, or end stage renal disease: Secondary | ICD-10-CM | POA: Diagnosis not present

## 2021-01-01 DIAGNOSIS — E871 Hypo-osmolality and hyponatremia: Secondary | ICD-10-CM | POA: Diagnosis present

## 2021-01-01 DIAGNOSIS — L089 Local infection of the skin and subcutaneous tissue, unspecified: Secondary | ICD-10-CM | POA: Diagnosis not present

## 2021-01-01 DIAGNOSIS — Z9102 Food additives allergy status: Secondary | ICD-10-CM

## 2021-01-01 DIAGNOSIS — Z881 Allergy status to other antibiotic agents status: Secondary | ICD-10-CM

## 2021-01-01 DIAGNOSIS — E8809 Other disorders of plasma-protein metabolism, not elsewhere classified: Secondary | ICD-10-CM | POA: Diagnosis present

## 2021-01-01 DIAGNOSIS — J9601 Acute respiratory failure with hypoxia: Secondary | ICD-10-CM

## 2021-01-01 DIAGNOSIS — I96 Gangrene, not elsewhere classified: Secondary | ICD-10-CM | POA: Diagnosis present

## 2021-01-01 DIAGNOSIS — Z9981 Dependence on supplemental oxygen: Secondary | ICD-10-CM

## 2021-01-01 DIAGNOSIS — L02416 Cutaneous abscess of left lower limb: Secondary | ICD-10-CM | POA: Diagnosis present

## 2021-01-01 DIAGNOSIS — J449 Chronic obstructive pulmonary disease, unspecified: Secondary | ICD-10-CM | POA: Diagnosis present

## 2021-01-01 DIAGNOSIS — N186 End stage renal disease: Secondary | ICD-10-CM | POA: Diagnosis present

## 2021-01-01 DIAGNOSIS — R0902 Hypoxemia: Secondary | ICD-10-CM

## 2021-01-01 DIAGNOSIS — L89154 Pressure ulcer of sacral region, stage 4: Secondary | ICD-10-CM | POA: Diagnosis present

## 2021-01-01 DIAGNOSIS — D631 Anemia in chronic kidney disease: Secondary | ICD-10-CM | POA: Diagnosis present

## 2021-01-01 DIAGNOSIS — E8889 Other specified metabolic disorders: Secondary | ICD-10-CM | POA: Diagnosis present

## 2021-01-01 DIAGNOSIS — F32A Depression, unspecified: Secondary | ICD-10-CM | POA: Diagnosis present

## 2021-01-01 DIAGNOSIS — E440 Moderate protein-calorie malnutrition: Secondary | ICD-10-CM | POA: Diagnosis not present

## 2021-01-01 DIAGNOSIS — K611 Rectal abscess: Secondary | ICD-10-CM | POA: Diagnosis present

## 2021-01-01 DIAGNOSIS — L02215 Cutaneous abscess of perineum: Secondary | ICD-10-CM | POA: Diagnosis not present

## 2021-01-01 DIAGNOSIS — L89309 Pressure ulcer of unspecified buttock, unspecified stage: Secondary | ICD-10-CM | POA: Diagnosis not present

## 2021-01-01 DIAGNOSIS — L0291 Cutaneous abscess, unspecified: Secondary | ICD-10-CM | POA: Diagnosis not present

## 2021-01-01 DIAGNOSIS — J9621 Acute and chronic respiratory failure with hypoxia: Secondary | ICD-10-CM | POA: Diagnosis present

## 2021-01-01 DIAGNOSIS — L02419 Cutaneous abscess of limb, unspecified: Secondary | ICD-10-CM | POA: Diagnosis not present

## 2021-01-01 DIAGNOSIS — L8990 Pressure ulcer of unspecified site, unspecified stage: Secondary | ICD-10-CM | POA: Insufficient documentation

## 2021-01-01 LAB — CBC WITH DIFFERENTIAL/PLATELET
Abs Immature Granulocytes: 0.17 10*3/uL — ABNORMAL HIGH (ref 0.00–0.07)
Basophils Absolute: 0 10*3/uL (ref 0.0–0.1)
Basophils Relative: 0 %
Eosinophils Absolute: 0 10*3/uL (ref 0.0–0.5)
Eosinophils Relative: 0 %
HCT: 29.3 % — ABNORMAL LOW (ref 36.0–46.0)
Hemoglobin: 8.8 g/dL — ABNORMAL LOW (ref 12.0–15.0)
Immature Granulocytes: 1 %
Lymphocytes Relative: 4 %
Lymphs Abs: 0.6 10*3/uL — ABNORMAL LOW (ref 0.7–4.0)
MCH: 28.4 pg (ref 26.0–34.0)
MCHC: 30 g/dL (ref 30.0–36.0)
MCV: 94.5 fL (ref 80.0–100.0)
Monocytes Absolute: 0.3 10*3/uL (ref 0.1–1.0)
Monocytes Relative: 2 %
Neutro Abs: 15.2 10*3/uL — ABNORMAL HIGH (ref 1.7–7.7)
Neutrophils Relative %: 93 %
Platelets: 158 10*3/uL (ref 150–400)
RBC: 3.1 MIL/uL — ABNORMAL LOW (ref 3.87–5.11)
RDW: 17 % — ABNORMAL HIGH (ref 11.5–15.5)
WBC: 16.2 10*3/uL — ABNORMAL HIGH (ref 4.0–10.5)
nRBC: 0.5 % — ABNORMAL HIGH (ref 0.0–0.2)

## 2021-01-01 LAB — GLUCOSE, CAPILLARY
Glucose-Capillary: 163 mg/dL — ABNORMAL HIGH (ref 70–99)
Glucose-Capillary: 201 mg/dL — ABNORMAL HIGH (ref 70–99)

## 2021-01-01 LAB — MAGNESIUM: Magnesium: 1.9 mg/dL (ref 1.7–2.4)

## 2021-01-01 LAB — MRSA PCR SCREENING: MRSA by PCR: NEGATIVE

## 2021-01-01 LAB — COMPREHENSIVE METABOLIC PANEL
ALT: 7 U/L (ref 0–44)
AST: 16 U/L (ref 15–41)
Albumin: 2.3 g/dL — ABNORMAL LOW (ref 3.5–5.0)
Alkaline Phosphatase: 198 U/L — ABNORMAL HIGH (ref 38–126)
Anion gap: 15 (ref 5–15)
BUN: 55 mg/dL — ABNORMAL HIGH (ref 6–20)
CO2: 20 mmol/L — ABNORMAL LOW (ref 22–32)
Calcium: 7.9 mg/dL — ABNORMAL LOW (ref 8.9–10.3)
Chloride: 98 mmol/L (ref 98–111)
Creatinine, Ser: 4.18 mg/dL — ABNORMAL HIGH (ref 0.44–1.00)
GFR, Estimated: 12 mL/min — ABNORMAL LOW (ref >60)
Glucose, Bld: 162 mg/dL — ABNORMAL HIGH (ref 70–99)
Potassium: 5.6 mmol/L — ABNORMAL HIGH (ref 3.5–5.1)
Sodium: 133 mmol/L — ABNORMAL LOW (ref 135–145)
Total Bilirubin: 0.7 mg/dL (ref 0.3–1.2)
Total Protein: 6.2 g/dL — ABNORMAL LOW (ref 6.5–8.1)

## 2021-01-01 LAB — PHOSPHORUS: Phosphorus: 6.1 mg/dL — ABNORMAL HIGH (ref 2.5–4.6)

## 2021-01-01 LAB — HIV ANTIBODY (ROUTINE TESTING W REFLEX): HIV Screen 4th Generation wRfx: NONREACTIVE

## 2021-01-01 LAB — LACTIC ACID, PLASMA: Lactic Acid, Venous: 1.5 mmol/L (ref 0.5–1.9)

## 2021-01-01 LAB — PROCALCITONIN: Procalcitonin: 1.32 ng/mL

## 2021-01-01 MED ORDER — IPRATROPIUM-ALBUTEROL 0.5-2.5 (3) MG/3ML IN SOLN: 3.0000 mL | Freq: Four times a day (QID) | RESPIRATORY_TRACT | Status: DC | PRN

## 2021-01-01 MED ORDER — LIDOCAINE HCL (PF) 1 % IJ SOLN
INTRAMUSCULAR | Status: AC
  Filled 2021-01-01: qty 30

## 2021-01-01 MED ORDER — NOREPINEPHRINE 4 MG/250ML-% IV SOLN
2.0000 ug/min | INTRAVENOUS | Status: DC
  Administered 2021-01-02: 2 ug/min via INTRAVENOUS
  Administered 2021-01-03: 3 ug/min via INTRAVENOUS
  Filled 2021-01-01 (×2): qty 250

## 2021-01-01 MED ORDER — LACTATED RINGERS IV SOLN: INTRAVENOUS | Status: DC

## 2021-01-01 MED ORDER — CHLORHEXIDINE GLUCONATE CLOTH 2 % EX PADS
6.0000 | MEDICATED_PAD | Freq: Every day | CUTANEOUS | Status: DC
  Administered 2021-01-01 – 2021-01-02 (×3): 6 via TOPICAL

## 2021-01-01 MED ORDER — HEPARIN SODIUM (PORCINE) 5000 UNIT/ML IJ SOLN
5000.0000 [IU] | Freq: Three times a day (TID) | INTRAMUSCULAR | Status: DC
  Administered 2021-01-01 – 2021-01-09 (×22): 5000 [IU] via SUBCUTANEOUS
  Filled 2021-01-01 (×22): qty 1

## 2021-01-01 MED ORDER — DOCUSATE SODIUM 100 MG PO CAPS: 100.0000 mg | ORAL_CAPSULE | Freq: Two times a day (BID) | ORAL | Status: DC | PRN

## 2021-01-01 MED ORDER — SODIUM CHLORIDE 0.9 % IV SOLN
500.0000 mg | INTRAVENOUS | Status: DC
  Administered 2021-01-02 – 2021-01-09 (×8): 500 mg via INTRAVENOUS
  Filled 2021-01-01 (×8): qty 0.5

## 2021-01-01 MED ORDER — POLYETHYLENE GLYCOL 3350 17 G PO PACK: 17.0000 g | PACK | Freq: Every day | ORAL | Status: DC | PRN

## 2021-01-01 MED ORDER — SODIUM CHLORIDE 0.9 % IV SOLN
250.0000 mL | INTRAVENOUS | Status: DC
  Administered 2021-01-01 – 2021-01-04 (×2): 250 mL via INTRAVENOUS

## 2021-01-01 MED ORDER — LINEZOLID 600 MG/300ML IV SOLN
600.0000 mg | Freq: Two times a day (BID) | INTRAVENOUS | Status: DC
  Administered 2021-01-01 – 2021-01-07 (×12): 600 mg via INTRAVENOUS
  Filled 2021-01-01 (×13): qty 300

## 2021-01-01 MED ORDER — HYDROCORTISONE NA SUCCINATE PF 100 MG IJ SOLR
50.0000 mg | Freq: Four times a day (QID) | INTRAMUSCULAR | Status: DC
  Administered 2021-01-01 – 2021-01-04 (×10): 50 mg via INTRAVENOUS
  Filled 2021-01-01 (×10): qty 2

## 2021-01-01 MED ORDER — INSULIN ASPART 100 UNIT/ML IJ SOLN
0.0000 [IU] | INTRAMUSCULAR | Status: DC
  Administered 2021-01-01: 3 [IU] via SUBCUTANEOUS
  Administered 2021-01-02: 1 [IU] via SUBCUTANEOUS
  Administered 2021-01-02 (×2): 2 [IU] via SUBCUTANEOUS
  Administered 2021-01-02: 1 [IU] via SUBCUTANEOUS
  Administered 2021-01-02: 2 [IU] via SUBCUTANEOUS
  Administered 2021-01-03: 1 [IU] via SUBCUTANEOUS
  Administered 2021-01-03: 2 [IU] via SUBCUTANEOUS
  Administered 2021-01-03: 1 [IU] via SUBCUTANEOUS
  Administered 2021-01-03: 2 [IU] via SUBCUTANEOUS
  Administered 2021-01-03: 1 [IU] via SUBCUTANEOUS
  Administered 2021-01-04: 5 [IU] via SUBCUTANEOUS
  Administered 2021-01-04: 1 [IU] via SUBCUTANEOUS
  Administered 2021-01-04: 2 [IU] via SUBCUTANEOUS
  Administered 2021-01-05: 3 [IU] via SUBCUTANEOUS

## 2021-01-01 NOTE — H&P (Signed)
NAME:  Theresa Russell, MRN:  [id number], DOB:  19-Sep-1960, LOS: 0 ADMISSION DATE:  01/01/2021, CONSULTATION DATE:  5/10 REFERRING MD:  Jerelene Redden MD, CHIEF COMPLAINT:  Sepsis  History of Present Illness:  [name] is a 60 y.o. F who was transferred from [facility] with sepsis.  She has a pertinent past medical history of COPD (2LNC), HTN, systolic HF, afib depression, DM II, ESRD (Tu,Th,S dialysis), known wounds to her lower extremities, groin, glutes, and coccyx, hx of necrotizing faschitis. She reportedly does not ambulate and was residing in a SNF.  Per chart review and discussion with her son Theresa Dash, [name] was transferred from her SNF to [facility] on 5/9 with concerns of altered mental status. She was found to be hypotensive and hypoxic on assesment. He last dialysis was Saturday where is was reported that 2L was pulled off. She was started on levophed, 3L of IVF was given, cultures were drawn, and she was started on Linezolid and Meropenem. A head CT was completed which was read by outside radiology as negative for acute process. A CT chest, ABD, and pelvis was obtained which was negative for PE but concerning for "crazy paving" with extensive infilitrate of both lungs, air fluid collection of the left anterior perineum and left medial thigh measuring 5.5 cm which was concerning for an abcess, pelvic dermoid/teratoma. She was found to have a leukocytosis of 13.1, lactate of 2.1 which increased to 2.5, troponin I of 265.   While she was being transferred to [facility] on 5/10 she received 247ml LR bolus.  Upon exam the patient is off vasopressors, normotensive, and confused to situation and location.  PCCM was consulted for admission for sepsis.   Pertinent  Medical History  COPD (2LNC) HTN Depression DM II ESRD (Tu,Th,S dialysis), Afib with RVR Nec fascitis s/p surgical debridement Chronic systolic left heart failure. known wounds to her lower extremities, groin,  glutes, and coccyx.   Significant Hospital Events: Including procedures, antibiotic start and stop dates in addition to other pertinent events   . 2/22 [facility]  . 3/17 Drainage of inner thigh abcess . 4/27 RT IJ Temp cath removal, RT IJ  Tunneled HD cath insertion. . 5/9 Presented to [facility] with AMS. Found to be hypotensive. Head CT  negative for acute process. CT with/without chest, ABD, and pelvis was obtained which was negative for PE but concerning for "crazy paving" with extensive infilitrate of both lungs, air fluid collection of the left anterior perineum and left medial thigh measuring 5.5 cm which was concerning for an abcess, pelvic dermoid/teratoma. Started on Linezolid and Meropenem . 5/10 Transferred to [facility]  Interim History / Subjective:  See above  Able to participate in parts of subjective exam. Denies chest pain, shortness of breath. Unable to obtain complete evaluation due to patient status and confusion.  Objective   Blood pressure (!) 84/69, pulse (!) 107, temperature 98 F (36.7 C), temperature source Oral, resp. rate 16, SpO2 95 %. On 4LNC       No intake or output data in the 24 hours ending 01/01/21 2220 There were no vitals filed for this visit.  Examination: General: in bed, obese, chronically ill appearing, no acute distress HEENT: MM pink/moist, anicteric, trachea midline, tunneled HD cath. Neuro: GCS14, confused to place and situation, RASS -1, PERRL 76mm CV: S1S2, Afib on monitor, no m/r/g appreciated PULM:  clear in the upper lobes, diminished in the lower lobes, chest  expansion symmetric GI: soft, bsx4 hypoactive, non tender   Extremities: warm/dry, no pretibial edema, capillary refill less than 3 seconds  Skin: Stage 4 coccyx with packing, stage 4 right glute with packing, L groin wound with purulence, multiple wounds to left and right calf and dorsal foot.   Labs/imaging that I havepersonally reviewed  (right click and  "Reselect all SmartList Selections" daily)  CBC, CMP, Troponin, Lactate  Resolved Hospital Problem list     Assessment & Plan:  Sepsis Shock ?source, rt dialysis port in place, infiltrates in lungs, purulent L groin wound, ?perineal abcess, stage 4 wounds to coccyx and glute. Lactate increasing at OSH. WBC 13.1. 3L of IVF given at OSH. -Obtain BC and TA. If not anuric obtain UC.  -Continue Meropenem and Linezolid. Narrow as cultures result -Initiate peripheral levophed. Goal map of 65. Titrate medication to goal  -AM CBC. Trend WBC/fever curve -Obtain Venous lactate, trend. -Start stress dose steroids 50mg  hydrocortisone q6h.  ?perineum abscess Per [facility] CT on 5/9. ? Communication with left medial leg. Some fluctuance to wound. History of nec fascitis. Wound cultures previously grew morganella, e.coli, MRSA.  -Consulting General surgery. Dr. Romana Juniper will see. -Antibiotics as discussed above.  -Obtain ID consult AM. Seen previously by ID.  Acute Metabolic Encephalopathy Suspect secondary to sepsis. 5/9 head ct negative -Goal MAP 65 as discussed -Delirium prevention precautions. Promote normal sleep/wake cycle. Avoid restraints as able.  Acute on Chronic Respiratory Failure with Hypoxia  COPD (2LNC at baseline) 5/9 CT negative for PE but concerning for "crazy paving" with extensive infilitrate of both lungs. On 4LNC.  -Continue ABX as discussed. Follow up TA. -Goal O2 sat 88-94. Wean O2 to goal -PRN albuterol nebulizer   Hx Afib Hx Systolic Heart Failure History of Afib, in afib, rate controlled, Was previously on Diltiazem 30mg  q8h and metoprolol 25mg  PO BID. Troponin 265. Suspect demand. -Continue telemetry -Trend troponin. -Will consider restarting home antiarrythmic and GDMT depending on HD status.  DM II -Continue SSI -Blood Glucose goal 140-180.  ESRD (Tu,Th,S dialysis) Last dialysed on Saturday 5/7.  -Follow up stat BMP - if no urgent needs for dialysis  plan to consult Nephrology for evaluation and dialysis plans in AM. Seen previously by [facility] Dr. Holley Raring -Obtain AM CMP, MG, Phos for evaluation  Wounds- BL lower extremities, L groin Stage 4 PI- RT glute and coccyx Per chart review was previously receiving dakins treatment to PI -Wound care consult  HTN Hypotensive currently -Resume home meds when appropriate  Depression -Resume home meds when appropriate  Anemia of Chronic Disease -Transfuse PRBC if HBG less than 7 -Obtain AM CBC to trend H&H  Pelvic dermoid/teratoma Per [facility] CT on 5/9 -Follow up with PCP.  Best practice (right click and "Reselect all SmartList Selections" daily)  Diet:  NPO Pain/Anxiety/Delirium protocol (if indicated): No VAP protocol (if indicated): Not indicated DVT prophylaxis: Subcutaneous Heparin GI prophylaxis: N/A Glucose control:  SSI Yes Central venous access:  N/A Arterial line:  N/A Foley:  N/A Mobility:  bed rest  PT consulted: N/A Last date of multidisciplinary goals of care discussion [With Son Theresa Russell at bedside 5/10] Code Status:  full code per [facility] 5/10 Disposition: ICU  Labs   CBC: Recent Labs  Lab 01/01/21 2039  WBC 16.2*  NEUTROABS 15.2*  HGB 8.8*  HCT 29.3*  MCV 94.5  PLT 854    Basic Metabolic Panel: Recent Labs  Lab 01/01/21 2039  NA 133*  K 5.6*  CL 98  CO2 20*  GLUCOSE 162*  BUN 55*  CREATININE 4.18*  CALCIUM 7.9*  MG 1.9  PHOS 6.1*   GFR: CrCl cannot be calculated (Unknown ideal weight.). Recent Labs  Lab 01/01/21 2039  PROCALCITON 1.32  WBC 16.2*  LATICACIDVEN 1.5    Liver Function Tests: Recent Labs  Lab 01/01/21 2039  AST 16  ALT 7  ALKPHOS 198*  BILITOT 0.7  PROT 6.2*  ALBUMIN 2.3*   No results for input(s): LIPASE, AMYLASE in the last 168 hours. No results for input(s): AMMONIA in the last 168 hours.  ABG    Component Value Date/Time   PHART 7.274 (L) 11/29/2020 0905   PCO2ART 51.5 (H) 11/29/2020 0905   PO2ART  68.5 (L) 11/29/2020 0905   HCO3 23.5 11/29/2020 0905   ACIDBASEDEF 2.6 (H) 11/29/2020 0905   O2SAT 92.7 11/29/2020 0905     Coagulation Profile: No results for input(s): INR, PROTIME in the last 168 hours.  Cardiac Enzymes: No results for input(s): CKTOTAL, CKMB, CKMBINDEX, TROPONINI in the last 168 hours.  HbA1C: Hgb A1c MFr Bld  Date/Time Value Ref Range Status  10/19/2020 03:58 AM 6.8 (H) 4.8 - 5.6 % Final    Comment:    (NOTE) Pre diabetes:          5.7%-6.4%  Diabetes:              >6.4%  Glycemic control for   <7.0% adults with diabetes     CBG: Recent Labs  Lab 01/01/21 1910  GLUCAP 163*    Review of Systems:   Able to participate in parts of ROS. Denies chest pain, shortness of breath. Unable to obtain complete full ROS due to patient status and confusion.  Past Medical History:  Per Chart review: Unable to obtain from patient COPD [facility]) HTN Depression DM II ESRD (Tu,Th,S dialysis), Afib with RVR Chronic systolic left heart failure. known wounds to her lower extremities, groin, glutes, and coccyx.  Surgical History:  Per chart review:Unable to obtain from patient  Nec fascitis s/p surgical debridement   Social History:      Family History:  Her family history is not on file.   Allergies Not on File   Home Medications  Prior to Admission medications   Not on File     Critical care time: [place] Matty Deamer, Jr., MSN, APRN, AGACNP-BC [facility]  01/01/2021 , 10:20 PM  Please see [email or web address] for pager details  If no response, please call [phone] After hours, please call Elink at [phone]

## 2021-01-01 NOTE — Consult Note (Signed)
Surgical Evaluation  Chief Complaint: Abscess  HPI: 60 year old lady with multiple severe comorbidities who was transferred to the [facility] from [facility] with sepsis.  Her medical problems include COPD on 2 L of oxygen, hypertension, systolic heart failure, A. fib, type 2 diabetes, end-stage renal disease on dialysis, multiple lower extremity wounds and a history of necrotizing soft tissue infection which was debrided at another facility a couple months ago.  History is taken from other providers and chart review as the patient is somewhat obtunded and unable to participate.  Please see the critical care H&P for additional details.    Histories unable to confirm at this time as patient is not participatory, but briefly listed above.  Review of Systems: a complete, 10pt review of systems was unable to be completed due to patient mental status  Physical Exam: Vitals:   01/01/21 2100 01/01/21 2335  BP: (!) 84/69   Pulse: (!) 107   Resp: 16   Temp:  (!) 97.5 F (36.4 C)  SpO2: 95%    Gen: Somnolent woman who appears chronically ill and older than stated age, diffuse anasarca Eyes: lids and conjunctivae normal, no icterus. Pupils equally round and reactive to light.  Neck: Trachea midline, tunneled HD cath present Chest: respiratory effort is normal.  Symmetrical air entry.  No crepitus or tenderness on palpation of the chest. Breath sounds equal.  Cardiovascular: A. fib Gastrointestinal: soft, nondistended, nontender. No mass, hepatomegaly or splenomegaly.  Anasarca. Lymphatic: no lymphadenopathy in the neck or groin Muscoloskeletal: no clubbing or cyanosis of the fingers.  Unable to assess strength and range of motion. Neuro: Somnolent, not answering questions or following commands, groaning with pain Psych: Unable to assess Skin: Multiple wounds in various stages of healing on bilateral lower extremities.  On the medial left thigh there is a large fluctuant area with mild  surrounding cellulitis.  There is a chronically draining wound in the left groin which tracks medially and posteriorly towards the rectum and has copious malodorous purulent drainage.   CBC Latest Ref Rng & Units 01/01/2021 12/24/2020 12/21/2020  WBC 4.0 - 10.5 K/uL 16.2(H) 7.7 5.8  Hemoglobin 12.0 - 15.0 g/dL 8.8(L) 8.4(L) 9.0(L)  Hematocrit 36.0 - 46.0 % 29.3(L) 29.4(L) 31.1(L)  Platelets 150 - 400 K/uL 158 162 147(L)    CMP Latest Ref Rng & Units 01/01/2021 12/24/2020 12/21/2020  Glucose 70 - 99 mg/dL 162(H) 175(H) 106(H)  BUN 6 - 20 mg/dL 55(H) 50(H) 33(H)  Creatinine 0.44 - 1.00 mg/dL 4.18(H) 4.34(H) 3.66(H)  Sodium 135 - 145 mmol/L 133(L) 141 141  Potassium 3.5 - 5.1 mmol/L 5.6(H) 5.3(H) 4.9  Chloride 98 - 111 mmol/L 98 105 101  CO2 22 - 32 mmol/L 20(L) 25 26  Calcium 8.9 - 10.3 mg/dL 7.9(L) 8.5(L) 8.6(L)  Total Protein 6.5 - 8.1 g/dL 6.2(L) - -  Total Bilirubin 0.3 - 1.2 mg/dL 0.7 - -  Alkaline Phos 38 - 126 U/L 198(H) - -  AST 15 - 41 U/L 16 - -  ALT 0 - 44 U/L 7 - -    Lab Results  Component Value Date   INR 1.2 12/19/2020   INR 1.3 (H) 10/21/2020    Imaging: No results found.   A/P: Very ill woman with a long protracted course with soft tissue infections of the lower extremities and groin.  I lanced the left medial thigh abscess and this abscess actually tracks cephalad and posteriorly and probably connects with the more posterior wound.  She has ongoing purulent drainage from her left groin wound from her previous debridement, likely this has not been adequately packed.  Her outside CT is on a disc year but are not able to look at the images at this point, hopefully we can get this uploaded into the system.  I think before any more aggressive debridement is considered for this patient, a goals of care discussion needs to be had as her prognosis is ultimately very poor if she has been unable to heal a soft tissue infection for nearly 3 months after debridement.  Surgery team  will follow to reassess her wounds tomorrow.    Patient Active Problem List   Diagnosis Date Noted  . Septic shock ([place]) 01/01/2021  . Acute respiratory failure with hypoxia ([place])   . Acute metabolic encephalopathy        Romana Juniper, MD [facility], [place]  See AMION to contact appropriate on-call provider

## 2021-01-01 NOTE — Procedures (Signed)
Incision and Drainage Procedure Note  Pre-operative Diagnosis: Abscess  Post-operative Diagnosis: same  Indications: Patient with complex medical history and over 3 months of intermittent recurrent soft tissue infections in the left thigh and groin  Anesthesia: 1% plain lidocaine  Procedure Details  The skin was sterilely prepped and draped over the affected area in the usual fashion. After adequate local anesthesia, I&D with a #11 blade was performed on the left medial thigh.  Copious purulent drainage: present The wound tracks cephalad and posteriorly.  Curlex packing was placed in the tract as well as an additional tract in the left groin  Findings: Copious purulent drainage, tract extending towards the gluteus  EBL: Minimal cc's  Drains: Docking in place which will be changed by the surgical team tomorrow  Condition: Tolerated procedure well   Complications: none.

## 2021-01-01 NOTE — Progress Notes (Signed)
Patient arrived to 3M09. Patient lethargic but with stimulation A&Ox2-3. Confused on situation. Needed to be repeatedly reminded she was at [facility] but aware location is a hospital. HR A fib 120-130s. SP02 93% Patient presenting with multiple wounds, deep tissue injuries and skin tears. Wound Consult placed.

## 2021-01-01 NOTE — Progress Notes (Signed)
Pharmacy Antibiotic Note  MAGIE CIAMPA is a 60 y.o. female admitted on 01/01/2021 from OSH hypotension and hypoxia. Pharmacy has been consulted for meropenem and linezolid dosing which was started at OSH. Last doses earlier this afternoon per OSH records. Pt has ESRD on HD TTS.  Plan: Linezolid 600mg  q12h starting tonight Meropenem 500mg  q24h starting tomorrow F/U LOT, cultures, dialysis plans     Temp (24hrs), Avg:98 F (36.7 C), Min:98 F (36.7 C), Max:98 F (36.7 C)  No results for input(s): WBC, CREATININE, LATICACIDVEN, VANCOTROUGH, VANCOPEAK, VANCORANDOM, GENTTROUGH, GENTPEAK, GENTRANDOM, TOBRATROUGH, TOBRAPEAK, TOBRARND, AMIKACINPEAK, AMIKACINTROU, AMIKACIN in the last 168 hours.  CrCl cannot be calculated (Unknown ideal weight.).    Not on File  Arrie Senate, PharmD, [place], [facility] Clinical Pharmacist [phone] Please check AMION for all [place] numbers 01/01/2021

## 2021-01-02 ENCOUNTER — Inpatient Hospital Stay (HOSPITAL_COMMUNITY): Payer: Medicare Other

## 2021-01-02 DIAGNOSIS — L0291 Cutaneous abscess, unspecified: Secondary | ICD-10-CM | POA: Diagnosis not present

## 2021-01-02 DIAGNOSIS — J9621 Acute and chronic respiratory failure with hypoxia: Secondary | ICD-10-CM

## 2021-01-02 DIAGNOSIS — L089 Local infection of the skin and subcutaneous tissue, unspecified: Secondary | ICD-10-CM

## 2021-01-02 DIAGNOSIS — R6521 Severe sepsis with septic shock: Secondary | ICD-10-CM

## 2021-01-02 DIAGNOSIS — N186 End stage renal disease: Secondary | ICD-10-CM | POA: Diagnosis not present

## 2021-01-02 LAB — BASIC METABOLIC PANEL
Anion gap: 13 (ref 5–15)
BUN: 56 mg/dL — ABNORMAL HIGH (ref 6–20)
CO2: 21 mmol/L — ABNORMAL LOW (ref 22–32)
Calcium: 8 mg/dL — ABNORMAL LOW (ref 8.9–10.3)
Chloride: 99 mmol/L (ref 98–111)
Creatinine, Ser: 4.32 mg/dL — ABNORMAL HIGH (ref 0.44–1.00)
GFR, Estimated: 11 mL/min — ABNORMAL LOW (ref >60)
Glucose, Bld: 177 mg/dL — ABNORMAL HIGH (ref 70–99)
Potassium: 5.2 mmol/L — ABNORMAL HIGH (ref 3.5–5.1)
Sodium: 133 mmol/L — ABNORMAL LOW (ref 135–145)

## 2021-01-02 LAB — MAGNESIUM: Magnesium: 1.9 mg/dL (ref 1.7–2.4)

## 2021-01-02 LAB — GLUCOSE, CAPILLARY
Glucose-Capillary: 162 mg/dL — ABNORMAL HIGH (ref 70–99)
Glucose-Capillary: 103 mg/dL — ABNORMAL HIGH (ref 70–99)
Glucose-Capillary: 147 mg/dL — ABNORMAL HIGH (ref 70–99)
Glucose-Capillary: 156 mg/dL — ABNORMAL HIGH (ref 70–99)
Glucose-Capillary: 174 mg/dL — ABNORMAL HIGH (ref 70–99)
Glucose-Capillary: 148 mg/dL — ABNORMAL HIGH (ref 70–99)

## 2021-01-02 LAB — PHOSPHORUS: Phosphorus: 6.5 mg/dL — ABNORMAL HIGH (ref 2.5–4.6)

## 2021-01-02 LAB — CBC
HCT: 29.3 % — ABNORMAL LOW (ref 36.0–46.0)
Hemoglobin: 8.7 g/dL — ABNORMAL LOW (ref 12.0–15.0)
MCH: 28.1 pg (ref 26.0–34.0)
MCHC: 29.7 g/dL — ABNORMAL LOW (ref 30.0–36.0)
MCV: 94.5 fL (ref 80.0–100.0)
Platelets: 140 10*3/uL — ABNORMAL LOW (ref 150–400)
RBC: 3.1 MIL/uL — ABNORMAL LOW (ref 3.87–5.11)
RDW: 17 % — ABNORMAL HIGH (ref 11.5–15.5)
WBC: 15.4 10*3/uL — ABNORMAL HIGH (ref 4.0–10.5)
nRBC: 0.4 % — ABNORMAL HIGH (ref 0.0–0.2)

## 2021-01-02 LAB — HEMOGLOBIN A1C
Hgb A1c MFr Bld: 5.8 % — ABNORMAL HIGH (ref 4.8–5.6)
Mean Plasma Glucose: 119.76 mg/dL

## 2021-01-02 LAB — ECHOCARDIOGRAM LIMITED
Height: 66 in
S' Lateral: 2.2 cm
Weight: 3368.63 oz

## 2021-01-02 LAB — LACTIC ACID, PLASMA: Lactic Acid, Venous: 2.1 mmol/L (ref 0.5–1.9)

## 2021-01-02 LAB — TROPONIN I (HIGH SENSITIVITY)
Troponin I (High Sensitivity): 183 ng/L (ref <18)
Troponin I (High Sensitivity): 195 ng/L (ref <18)

## 2021-01-02 MED ORDER — LACTATED RINGERS IV BOLUS
500.0000 mL | Freq: Once | INTRAVENOUS | Status: AC
  Administered 2021-01-02: 500 mL via INTRAVENOUS

## 2021-01-02 MED ORDER — PERFLUTREN LIPID MICROSPHERE
1.0000 mL | INTRAVENOUS | Status: DC | PRN
  Filled 2021-01-02: qty 10

## 2021-01-02 MED ORDER — CHLORHEXIDINE GLUCONATE CLOTH 2 % EX PADS
6.0000 | MEDICATED_PAD | Freq: Every day | CUTANEOUS | Status: DC
  Administered 2021-01-04 – 2021-01-05 (×3): 6 via TOPICAL

## 2021-01-02 NOTE — Consult Note (Signed)
Reason for Consult: To manage dialysis and dialysis related needs Referring Physician: Everlee Quakenbush is an 60 y.o. female with past medical history significant for  DM, HTN, systolic heart failure with A fib, COPD on chronic o2, morbid obesity, history of necrotizing fascitis-  Mention of [facility] stay in the past.  Currently at SNF and ESRD undergoing dialysis at [facility].  She was taken to the hospital in [place] with decreased LOC- found to be hypotensive and hypoxic- CT of abdomen/pelvis showed pulm infiltrates and an air fluid collection of the perineum and medial thigh.  She was transferred to [place] debridement of wound per surgery "copious purulent drainage"  Now hypotensive in the ICU on broad spectrum abx.  Her neuro baseline is unknown but she is not very responsive to me today-  Can get to open eyes but no commands.  Currently needing pressors- is also getting volume in the form of lactated ringers.  We are asked to provide HD-  her last treatment was Saturday 5/7 as OP   Dialyzes at [facility]  TTS 4 hours  EDW 93 kg. HD Bath 2/2.5, Dialyzer unknown, Heparin yes- 2000 per hour. Access TDC.BFR 350/dfr 500 epogen 8000 per tx, iron weekly   No past medical history on file.    No family history on file.  Social History:  has no history on file for tobacco use, alcohol use, and drug use.  Allergies:  Allergies  Allergen Reactions  . Atorvastatin Other (See Comments)    Unknown reaction  . Canagliflozin Other (See Comments)    Unknown   . Levofloxacin Other (See Comments)    Unknown  . Lisinopril Other (See Comments)    Unknown  . Other     Orange dye   . Vancomycin Other (See Comments)    Unknown    Medications: I have reviewed the patient's current medications.   Results for orders placed or performed during the hospital encounter of 01/01/21 (from the past 48 hour(s))  MRSA PCR Screening     Status:  None   Collection Time: 01/01/21  7:04 PM   Specimen: Nasopharyngeal  Result Value Ref Range   MRSA by PCR NEGATIVE NEGATIVE    Comment:        The GeneXpert MRSA Assay (FDA approved for NASAL specimens only), is one component of a comprehensive MRSA colonization surveillance program. It is not intended to diagnose MRSA infection nor to guide or monitor treatment for MRSA infections. Performed at [facility], [place] [address] [place], [place] [address]   Glucose, capillary     Status: Abnormal   Collection Time: 01/01/21  7:10 PM  Result Value Ref Range   Glucose-Capillary 163 (H) 70 - 99 mg/dL    Comment: Glucose reference range applies only to samples taken after fasting for at least 8 hours.  HIV Antibody (routine testing w rflx)     Status: None   Collection Time: 01/01/21  8:39 PM  Result Value Ref Range   HIV Screen 4th Generation wRfx Non Reactive Non Reactive    Comment: Performed at [facility], [place] [address] [place], [place] [address]  Comprehensive metabolic panel     Status: Abnormal   Collection Time: 01/01/21  8:39 PM  Result Value Ref Range   Sodium 133 (L) 135 - 145 mmol/L   Potassium 5.6 (H) 3.5 - 5.1 mmol/L   Chloride 98 98 - 111  mmol/L   CO2 20 (L) 22 - 32 mmol/L   Glucose, Bld 162 (H) 70 - 99 mg/dL    Comment: Glucose reference range applies only to samples taken after fasting for at least 8 hours.   BUN 55 (H) 6 - 20 mg/dL   Creatinine, Ser 4.18 (H) 0.44 - 1.00 mg/dL   Calcium 7.9 (L) 8.9 - 10.3 mg/dL   Total Protein 6.2 (L) 6.5 - 8.1 g/dL   Albumin 2.3 (L) 3.5 - 5.0 g/dL   AST 16 15 - 41 U/L   ALT 7 0 - 44 U/L   Alkaline Phosphatase 198 (H) 38 - 126 U/L   Total Bilirubin 0.7 0.3 - 1.2 mg/dL   GFR, Estimated 12 (L) >60 mL/min    Comment: (NOTE) Calculated using the CKD-EPI Creatinine Equation (2021)    Anion gap 15 5 - 15    Comment: Performed at [facility] [address] [place], [place] [address]  Magnesium     Status:  None   Collection Time: 01/01/21  8:39 PM  Result Value Ref Range   Magnesium 1.9 1.7 - 2.4 mg/dL    Comment: Performed at [facility], [place] [address] [place], [place] [address]  Phosphorus     Status: Abnormal   Collection Time: 01/01/21  8:39 PM  Result Value Ref Range   Phosphorus 6.1 (H) 2.5 - 4.6 mg/dL    Comment: Performed at [facility] [address], [place], [place] [address]  CBC WITH DIFFERENTIAL     Status: Abnormal   Collection Time: 01/01/21  8:39 PM  Result Value Ref Range   WBC 16.2 (H) 4.0 - 10.5 K/uL   RBC 3.10 (L) 3.87 - 5.11 MIL/uL   Hemoglobin 8.8 (L) 12.0 - 15.0 g/dL   HCT 29.3 (L) 36.0 - 46.0 %   MCV 94.5 80.0 - 100.0 fL   MCH 28.4 26.0 - 34.0 pg   MCHC 30.0 30.0 - 36.0 g/dL   RDW 17.0 (H) 11.5 - 15.5 %   Platelets 158 150 - 400 K/uL   nRBC 0.5 (H) 0.0 - 0.2 %   Neutrophils Relative % 93 %   Neutro Abs 15.2 (H) 1.7 - 7.7 K/uL   Lymphocytes Relative 4 %   Lymphs Abs 0.6 (L) 0.7 - 4.0 K/uL   Monocytes Relative 2 %   Monocytes Absolute 0.3 0.1 - 1.0 K/uL   Eosinophils Relative 0 %   Eosinophils Absolute 0.0 0.0 - 0.5 K/uL   Basophils Relative 0 %   Basophils Absolute 0.0 0.0 - 0.1 K/uL   Immature Granulocytes 1 %   Abs Immature Granulocytes 0.17 (H) 0.00 - 0.07 K/uL    Comment: Performed at [facility], [address], [place], [place] [address]  Procalcitonin     Status: None   Collection Time: 01/01/21  8:39 PM  Result Value Ref Range   Procalcitonin 1.32 ng/mL    Comment:        Interpretation: PCT > 0.5 ng/mL and <= 2 ng/mL: Systemic infection (sepsis) is possible, but other conditions are known to elevate PCT as well. (NOTE)       Sepsis PCT Algorithm           Lower Respiratory Tract  Infection PCT Algorithm    ----------------------------     ----------------------------         PCT < 0.25 ng/mL                PCT < 0.10 ng/mL          Strongly encourage             Strongly  discourage   discontinuation of antibiotics    initiation of antibiotics    ----------------------------     -----------------------------       PCT 0.25 - 0.50 ng/mL            PCT 0.10 - 0.25 ng/mL               OR       >80% decrease in PCT            Discourage initiation of                                            antibiotics      Encourage discontinuation           of antibiotics    ----------------------------     -----------------------------         PCT >= 0.50 ng/mL              PCT 0.26 - 0.50 ng/mL                AND       <80% decrease in PCT             Encourage initiation of                                             antibiotics       Encourage continuation           of antibiotics    ----------------------------     -----------------------------        PCT >= 0.50 ng/mL                  PCT > 0.50 ng/mL               AND         increase in PCT                  Strongly encourage                                      initiation of antibiotics    Strongly encourage escalation           of antibiotics                                     -----------------------------                                           PCT <= 0.25 ng/mL  OR                                        > 80% decrease in PCT                                      Discontinue / Do not initiate                                             antibiotics  Performed at [facility], [place] [address], [place], [place] [address]   Lactic acid, plasma     Status: None   Collection Time: 01/01/21  8:39 PM  Result Value Ref Range   Lactic Acid, Venous 1.5 0.5 - 1.9 mmol/L    Comment: Performed at [facility] [address], [place], [place] [address]  Culture, blood (Routine X 2) w Reflex to ID Panel     Status: None (Preliminary result)   Collection Time: 01/01/21  9:51 PM   Specimen: BLOOD LEFT FOREARM  Result Value Ref Range   Specimen Description  BLOOD LEFT FOREARM    Special Requests      BOTTLES DRAWN AEROBIC AND ANAEROBIC Blood Culture adequate volume   Culture      NO GROWTH < 12 HOURS Performed at [facility], [place] [address] [place], [place] [address]    Report Status PENDING   Culture, blood (Routine X 2) w Reflex to ID Panel     Status: None (Preliminary result)   Collection Time: 01/01/21 10:01 PM   Specimen: BLOOD  Result Value Ref Range   Specimen Description BLOOD RIGHT ANTECUBITAL    Special Requests      BOTTLES DRAWN AEROBIC AND ANAEROBIC Blood Culture adequate volume   Culture      NO GROWTH < 12 HOURS Performed at [facility], [place] [address] [place], [place] [address]    Report Status PENDING   Glucose, capillary     Status: Abnormal   Collection Time: 01/01/21 11:33 PM  Result Value Ref Range   Glucose-Capillary 201 (H) 70 - 99 mg/dL    Comment: Glucose reference range applies only to samples taken after fasting for at least 8 hours.  CBC     Status: Abnormal   Collection Time: 01/02/21  1:30 AM  Result Value Ref Range   WBC 15.4 (H) 4.0 - 10.5 K/uL   RBC 3.10 (L) 3.87 - 5.11 MIL/uL   Hemoglobin 8.7 (L) 12.0 - 15.0 g/dL   HCT 29.3 (L) 36.0 - 46.0 %   MCV 94.5 80.0 - 100.0 fL   MCH 28.1 26.0 - 34.0 pg   MCHC 29.7 (L) 30.0 - 36.0 g/dL   RDW 17.0 (H) 11.5 - 15.5 %   Platelets 140 (L) 150 - 400 K/uL   nRBC 0.4 (H) 0.0 - 0.2 %    Comment: Performed at [facility] [address], [place], [place] [address]  Basic metabolic panel     Status: Abnormal   Collection Time: 01/02/21  1:30 AM  Result Value Ref Range   Sodium 133 (L) 135 - 145 mmol/L   Potassium 5.2 (  H) 3.5 - 5.1 mmol/L   Chloride 99 98 - 111 mmol/L   CO2 21 (L) 22 - 32 mmol/L   Glucose, Bld 177 (H) 70 - 99 mg/dL    Comment: Glucose reference range applies only to samples taken after fasting for at least 8 hours.   BUN 56 (H) 6 - 20 mg/dL   Creatinine, Ser 4.32 (H) 0.44 - 1.00 mg/dL   Calcium 8.0 (L) 8.9 - 10.3  mg/dL   GFR, Estimated 11 (L) >60 mL/min    Comment: (NOTE) Calculated using the CKD-EPI Creatinine Equation (2021)    Anion gap 13 5 - 15    Comment: Performed at [facility] [address], [place], [place] [address]  Magnesium     Status: None   Collection Time: 01/02/21  1:30 AM  Result Value Ref Range   Magnesium 1.9 1.7 - 2.4 mg/dL    Comment: Performed at [facility], [place] [address], [place], [place] [address]  Phosphorus     Status: Abnormal   Collection Time: 01/02/21  1:30 AM  Result Value Ref Range   Phosphorus 6.5 (H) 2.5 - 4.6 mg/dL    Comment: Performed at [facility] [address], [place],  [address]  Hemoglobin A1c     Status: Abnormal   Collection Time: 01/02/21  1:30 AM  Result Value Ref Range   Hgb A1c MFr Bld 5.8 (H) 4.8 - 5.6 %    Comment: (NOTE) Pre diabetes:          5.7%-6.4%  Diabetes:              >6.4%  Glycemic control for   <7.0% adults with diabetes    Mean Plasma Glucose 119.76 mg/dL    Comment: Performed at [facility] [address], [place], [place] [address]  Glucose, capillary     Status: Abnormal   Collection Time: 01/02/21  3:25 AM  Result Value Ref Range   Glucose-Capillary 162 (H) 70 - 99 mg/dL    Comment: Glucose reference range applies only to samples taken after fasting for at least 8 hours.  Troponin I (High Sensitivity)     Status: Abnormal   Collection Time: 01/02/21  5:29 AM  Result Value Ref Range   Troponin I (High Sensitivity) 183 (HH) <18 ng/L    Comment: CRITICAL RESULT CALLED TO, READ BACK BY AND VERIFIED WITH: A.Rayburn Go 01/02/2021 0659 DAVISB (NOTE) Elevated high sensitivity troponin I (hsTnI) values and significant  changes across serial measurements may suggest ACS but many other  chronic and acute conditions are known to elevate hsTnI results.  Refer to the Links section for chest pain algorithms and additional  guidance. Performed at [facility], [place] [address], [place], [place] [address]   Glucose, capillary     Status: Abnormal   Collection Time: 01/02/21  7:43 AM  Result Value Ref Range   Glucose-Capillary 103 (H) 70 - 99 mg/dL    Comment: Glucose reference range applies only to samples taken after fasting for at least 8 hours.  Lactic acid, plasma     Status: Abnormal   Collection Time: 01/02/21  8:53 AM  Result Value Ref Range   Lactic Acid, Venous 2.1 (HH) 0.5 - 1.9 mmol/L    Comment: CRITICAL RESULT CALLED TO, READ BACK BY AND VERIFIED WITH: NATE DAWKINS,RN AT 1009 01/02/2021 BY ZBEECH. Performed at [facility], [place],  [place] [address]   Troponin I (High Sensitivity)     Status: Abnormal   Collection Time: 01/02/21  8:53 AM  Result Value Ref Range   Troponin I (High Sensitivity) 195 (HH) <18 ng/L    Comment: CRITICAL VALUE NOTED.  VALUE IS CONSISTENT WITH PREVIOUSLY REPORTED AND CALLED VALUE. (NOTE) Elevated high sensitivity troponin I (hsTnI) values and significant  changes across serial measurements may suggest ACS but many other  chronic and acute conditions are known to elevate hsTnI results.  Refer to the Links section for chest pain algorithms and additional  guidance. Performed at [facility], [place] [address], [place], [place] [address]   Glucose, capillary     Status: Abnormal   Collection Time: 01/02/21 12:04 PM  Result Value Ref Range   Glucose-Capillary 147 (H) 70 - 99 mg/dL    Comment: Glucose reference range applies only to samples taken after fasting for at least 8 hours.    No results found.  ROS: unable to be obtained due to patients mental status Blood pressure (!) 87/70, pulse (!) 103, temperature (!) 97.3 F (36.3 C), temperature source Axillary, resp. rate 12, height 5\' 6"  (1.676 m), weight 95.5 kg, SpO2 97 %. General appearance: appears stated age, morbidly obese and uncooperative Eyes: conjunctivae/corneas clear. PERRL, EOM's intact. Fundi benign. Resp: diminished breath  sounds bilaterally Cardio: regular rate and rhythm, S1, S2 normal, no murmur, click, rub or gallop GI: soft, non-tender; bowel sounds normal; no masses,  no organomegaly Extremities: edema 1+  multiple abrasions present-  serosang drainage from left medail thigh and from other sites Skin: Skin color, texture, turgor normal. No rashes or lesions Neurologic: Mental status: Alert, oriented, thought content appropriate, alertness: stuperous there is a right sided TDC in place-  no overwhelming evidence of infection  Assessment/Plan: 60 year old BF with multiple medical issues including ESRD and chronic wound.  She now has what appears to be sepsis in the setting of these wounds requiring pressor support  1 wounds/sepsis-  S/p a brief debridement per surgery-  Likely needs more-  Also meropenam and zyvox 2 ESRD: normally TTS at [facility] via [facility].  No absolute indications for dialysis right now.  Will likely need tomorrow.  Would like to see if hemodynamics improve after abx to see if will tolerated intermittent HD.  If not - may need CRRT.   Can go easy on UF since in the volume resuscitation phase but dont think is too dry at this time 3 Hypertension: hypotensive likely due to sepsis-  Volume seems generous based on edema but also likely third spacing due to hypoalbuminemia 4. Anemia of ESRD: chronic issue-  Will cont ESA-  No iron due to infections 5. 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  Phos up right now but not emergent to treat  6. Dispo-  I do not know this lady so do not know her baseline MS-  I agree in theory that her quality of life is not great.  Apparently son is POA and wishes for continued aggressive therapy    Louis Meckel 01/02/2021, 1:24 PM

## 2021-01-02 NOTE — Progress Notes (Addendum)
NAME:  Theresa Russell, MRN:  [id number], DOB:  June 16, 1961, LOS: 1 ADMISSION DATE:  01/01/2021, CONSULTATION DATE:  5/10 REFERRING MD:  Jerelene Redden MD, CHIEF COMPLAINT:  Sepsis  History of Present Illness:  Theresa Russell is a 60 y.o. F who was transferred from [facility] with sepsis.  She has a pertinent past medical history of COPD [facility]), HTN, systolic HF, afib depression, DM II, ESRD (Tu,Th,S dialysis), known wounds to her lower extremities, groin, glutes, and coccyx due to complicated hospital stay from February to April of this year due to necrotizing fasciitis with Morganella, E. Coli, and MRSA and later complicated by klebsiella pneumonia and klebsiella bacteremia. She was treated with a myriad of antibiotics including zosyn, clindamycin, cefepime, Zyvox, doxycycline and meropenem and multiple debridements. She reportedly does not ambulate and was residing in a SNF.  Per chart review and discussion with her son Ulice Dash, Theresa Russell was transferred from her SNF to [facility] on 5/9 with concerns of altered mental status. She was found to be hypotensive and hypoxic on assesment. He last dialysis was Saturday where is was reported that 2L was pulled off. She was started on levophed, 3L of IVF was given, cultures were drawn, and she was started on Linezolid and Meropenem. A head CT was completed which was read by outside radiology as negative for acute process. A CT chest, ABD, and pelvis was obtained which was negative for PE but concerning for "crazy paving" with extensive infilitrate of both lungs, air fluid collection of the left anterior perineum and left medial thigh measuring 5.5 cm which was concerning for an abcess, pelvic dermoid/teratoma. She was found to have a leukocytosis of 13.1, lactate of 2.1 which increased to 2.5, and troponin I of 265. During transfer to [facility] on 5/10 she received 25m LR bolus and found to be normotensive, confused and off of pressors at time of  arrival.  PCCM was consulted for admission for sepsis.  Pertinent  Medical History  COPD (2LNC) HTN Depression DM II ESRD (Tu,Th,S dialysis), Afib with RVR Nec fasciitis s/p surgical debridement with MRSA, Morganella and E. Coli infection. Klebsiella bacteremia/pneumonia Chronic systolic heart failure Known wounds to her lower extremities, groin, glutes, and coccyx.   Significant Hospital Events: Including procedures, antibiotic start and stop dates in addition to other pertinent events   . 2/22 Admit to [facility] specialty hospital  . 3/17 Drainage of inner thigh abcess . 4/27 RT IJ Temp cath removal, RT IJ  Tunneled HD cath insertion. . 5/9 Presented to [facility] with AMS. Found to be hypotensive. Head CT  negative for acute process. CT with/without chest, ABD, and pelvis was obtained which was negative for PE but concerning for "crazy paving" with extensive infilitrate of both lungs, air fluid collection of the left anterior perineum and left medial thigh measuring 5.5 cm which was concerning for an abcess, pelvic dermoid/teratoma. Started on Linezolid and Meropenem . [facility] Interim History / Subjective:  Patient able to participate in parts of subjective exam although attention wanes during conversation. Patient denies pain this AM. Able to follow commands and identify name, place and month but not date or year.    Surgery performed emergent I&D to left medial thigh with copious amounts of purulent discharge with noted tracts extending cephalad and posteriorly toward gluteus. Curlex packing was placed in the tract.  Patient's maps have been at goal so levophed not started overnight.  Objective   Blood pressure (Marland Kitchen  87/70, pulse (!) 103, temperature (!) 97.1 F (36.2 C), temperature source Axillary, resp. rate 12, height 5' 6"  (1.676 m), weight 95.5 kg, SpO2 97 %. On 4LNC        Intake/Output Summary (Last 24 hours) at 01/02/2021 1052 Last data filed at  01/02/2021 1000 Gross per 24 hour  Intake 1845.53 ml  Output --  Net 1845.53 ml   Filed Weights   01/02/21 0500  Weight: 95.5 kg   Examination: General: in bed, obese, chronically ill appearing, no acute distress HEENT: MMM, anicteric, trachea midline, tunneled HD cath R upper chest. Neuro: A&Ox2, oriented to person, place and month but not date or year, RASS -2, PERRL 39m CV: Normal S1 and S2, Irregular rhythm, Afib on monitor, no m/r/g appreciated PULM:  CTAB, chest expansion symmetric GI: Soft, NT, BS throughout, indurated skin of lower abdomen Extremities: warm/dry, no pretibial edema Skin: Wound covered on coccyx and right glute, L groin wound covered with bandage, multiple wounds to left and right calf and dorsal foot. Indurated skin of bilateral upper medial thigh.  Labs/imaging that I havepersonally reviewed  (right click and "Reselect all SmartList Selections" daily)  WBC 16.2->15.4 (OSH WBC 13.1) Hgb 8.8->8.7 Plt 140 K 5.6->5.2 Mg 1.9 Phos 6.1->6.5 Troponin 1 183 (OSH Troponin 1 265) Lactic acid 1.5 (OSH 2.1->2.5) BCx NG <12 hours  Resolved Hospital Problem list     Assessment & Plan:  Ms. CROBERTINE KIPPERis a 60y.o. F w/ pmhx of COPD (2LNC), HTN, systolic HF, afib depression, DM II, ESRD (Tu,Th,S dialysis), known wounds to her lower extremities, groin, glutes, and coccyx due to complicated hospital stay from February to April of this year due to necrotizing fasciitis [facility].  Overall, need goals of care discussion with family before proceeding with more invasive interventions given patient's poor prognosis.  Sepsis (Septic Shock at OSH) Questions the source, rt dialysis port in place, infiltrates in lungs, purulent L groin wound, questionable perineal abcess, and stage 4 wounds to coccyx and glute. Lactate increasing at OSH although lower here compared to OSH. WBC 15.4. 3L of IVF given at [facility]SH. [facility]  was not able to get culture of purulent sputum from I&D. Pending sputum aspirate. Patient with BPs trending down to 80s/70s so giving 500cc bolus LR and checking lactic acid and troponin. -Reassess s/p bolus -Initiate peripheral levophed with goal map of 65. Titrate medication to goal  -Continue Meropenem and Linezolid. Narrow as cultures result -Continue stress dose steroids 517mhydrocortisone q6h. -Trend CBC/fever curve -Trend lactate and troponin  Acute Metabolic Encephalopathy Suspect secondary to sepsis. 5/9 head ct negative -Goal MAP 65 as discussed -Delirium prevention precautions. Promote normal sleep/wake cycle. Avoid restraints as able.  Acute on Chronic Respiratory Failure with Hypoxia  COPD (2LNC at baseline). 5/9 CT negative for PE but concerning for "crazy paving" with extensive infilitrate of both lungs. On 4LNC. Will get CXR. -Continue Abx as discussed -Follow up sputum culture -Follow up CXR -Goal O2 sat 88-94. Wean O2 to goal -PRN albuterol nebulizer  Left medial thigh/Perineum abscess s/p debridement 5/10 Per [facility] CT on 5/9. Surgery performed I&D 5/10 early AM with copious purulent discahrge and potential communication with left medial leg. History of necrotizing fasciitis. Wound cultures previously grew morganella, e.coli, MRSA.  -General surgery consulted- recommend goals of care discussion before preceding with surgery given months of poor wound healing -Continue dressing changes -Antibiotics as discussed above -  ID consulted; appreciate recs  Wounds- BL lower extremities, L groin Stage 4 PI- RT glute and coccyx Per chart review was previously receiving dakins treatment to PI -Wound care consulted  Hx Afib Hx Systolic Heart Failure History of Afib, in afib, rate controlled, was previously on Diltiazem 65m q8h and metoprolol 217mPO BID. Troponin 265 OSH and 183 here. Suspect demand ischemia although will follow one more troponin in setting of downtrending  BPs. Assessing status with limited echo. -FU echo -Continue telemetry -Trend troponin -Will consider restarting home antiarrythmic and GDMT depending on HD status and BP improvement  HTN Hypotensive currently -Resume home meds when appropriate  Anemia of Chronic Disease (Baseline 8-9) -8.7 today -Transfuse pRBC if Hgb <7 -Trend CBC  ESRD (Tu,Th,S dialysis) Last dialysed on Saturday 5/7 with 2L pulled off.  -Nephrology consulted -Trend CMP, MG, Phos  DM II -Continue SSI -Blood Glucose goal 140-180  Depression -Resume home meds when appropriate  Pelvic dermoid/teratoma Per [facility] CT on 5/9 -Follow up with PCP  Best practice (right click and "Reselect all SmartList Selections" daily)  Diet:  NPO Pain/Anxiety/Delirium protocol (if indicated): No VAP protocol (if indicated): Not indicated DVT prophylaxis: Subcutaneous Heparin GI prophylaxis: N/A Glucose control:  SSI Yes Central venous access:  N/A- RT IJ tunneled cath for HD Arterial line:  N/A Foley:  N/A Mobility:  bed rest  PT consulted: N/A Last date of multidisciplinary goals of care discussion: Updated son, JaUlice Dashvia phone 5/10- states he has Power of AtWalt DisneyCode Status:  full code [facility] Disposition: ICU  Labs   CBC: Recent Labs  Lab 01/01/21 2039 01/02/21 0130  WBC 16.2* 15.4*  NEUTROABS 15.2*  --   HGB 8.8* 8.7*  HCT 29.3* 29.3*  MCV 94.5 94.5  PLT 158 140*    Basic Metabolic Panel: Recent Labs  Lab 01/01/21 2039 01/02/21 0130  NA 133* 133*  K 5.6* 5.2*  CL 98 99  CO2 20* 21*  GLUCOSE 162* 177*  BUN 55* 56*  CREATININE 4.18* 4.32*  CALCIUM 7.9* 8.0*  MG 1.9 1.9  PHOS 6.1* 6.5*   GFR: Estimated Creatinine Clearance: 16.7 mL/min (A) (by C-G formula based on SCr of 4.32 mg/dL (H)). Recent Labs  Lab 01/01/21 2039 01/02/21 0130 01/02/21 0853  PROCALCITON 1.32  --   --   WBC 16.2* 15.4*  --   LATICACIDVEN 1.5  --  2.1*    Liver Function Tests: Recent Labs  Lab  01/01/21 2039  AST 16  ALT 7  ALKPHOS 198*  BILITOT 0.7  PROT 6.2*  ALBUMIN 2.3*   No results for input(s): LIPASE, AMYLASE in the last 168 hours. No results for input(s): AMMONIA in the last 168 hours.  ABG    Component Value Date/Time   PHART 7.274 (L) 11/29/2020 0905   PCO2ART 51.5 (H) 11/29/2020 0905   PO2ART 68.5 (L) 11/29/2020 0905   HCO3 23.5 11/29/2020 0905   ACIDBASEDEF 2.6 (H) 11/29/2020 0905   O2SAT 92.7 11/29/2020 0905     Coagulation Profile: No results for input(s): INR, PROTIME in the last 168 hours.  Cardiac Enzymes: No results for input(s): CKTOTAL, CKMB, CKMBINDEX, TROPONINI in the last 168 hours.  HbA1C: Hgb A1c MFr Bld  Date/Time Value Ref Range Status  01/02/2021 01:30 AM 5.8 (H) 4.8 - 5.6 % Final    Comment:    (NOTE) Pre diabetes:          5.7%-6.4%  Diabetes:              >  6.4%  Glycemic control for   <7.0% adults with diabetes   10/19/2020 03:58 AM 6.8 (H) 4.8 - 5.6 % Final    Comment:    (NOTE) Pre diabetes:          5.7%-6.4%  Diabetes:              >6.4%  Glycemic control for   <7.0% adults with diabetes     CBG: Recent Labs  Lab 01/01/21 1910 01/01/21 2333 01/02/21 0325 01/02/21 0743  GLUCAP 163* 201* 162* 103*    Review of Systems:   Able to participate in parts of ROS. Denies chest pain, shortness of breath. Unable to obtain complete full ROS due to patient status and confusion.  Past Medical History:  Per Chart review: Unable to obtain from patient COPD [facility]) HTN Depression DM II ESRD (Tu,Th,S dialysis), Afib with RVR Chronic systolic heart failure Known wounds to her lower extremities, groin, glutes, and coccyx Nec fasciitis s/p surgical debridement with MRSA, Morganella and E. Coli infection. Klebsiella bacteremia/pneumonia  Surgical History:  Per chart review:Unable to obtain from patient Nec fascitis s/p surgical debridement   Social History:      Family History:  Her family history is not on  file.   Allergies Allergies  Allergen Reactions  . Atorvastatin Other (See Comments)    Unknown reaction  . Canagliflozin Other (See Comments)    Unknown   . Levofloxacin Other (See Comments)    Unknown  . Lisinopril Other (See Comments)    Unknown  . Other     Orange dye   . Vancomycin Other (See Comments)    Unknown     Home Medications  Prior to Admission medications   Not on File     Critical care time: [place], MS4

## 2021-01-02 NOTE — Consult Note (Signed)
WOC Nurse Consult Note: Patient receiving care in 3M09 Seen by Dr. Marlou Starks (Gen Surgery) this AM Reason for Consult: DTPI, Skin tears, abscesses, sacral wound. This patient was seen by Dr. Marlou Starks this AM and per his note "Wounds could be explored and opened up more in the OR   Prognosis is poor either way. I agree that medical team and family should decide on goals of care prior to subjecting her to more surgery.  Continue dressing changes and abx. Will follow" Per Dr. Kae Heller (Gen Surgery) she will follow up with this patient again tomorrow. WOC will not follow at this time.   Monitor the wound area(s) for worsening of condition such as: Signs/symptoms of infection, increase in size, development of or worsening of odor, development of pain, or increased pain at the affected locations.   Notify the medical team if any of these develop.  Thank you for the consult. [place] nurse will not follow at this time.   Please re-consult the [place] team if needed.  Cathlean Marseilles Tamala Julian, MSN, RN, [place], Lysle Pearl, [facility] Wound Treatment Associate Pager [phone]

## 2021-01-02 NOTE — Consult Note (Signed)
[place] [facility] for Infectious Disease    Date of Admission:  01/01/2021     Total days of antibiotics                Reason for Consult: Necrotizing fascitis   Referring Provider: Loanne Drilling Primary Care Provider: Pcp, No   ASSESSMENT:  Theresa Russell is a 52 AA female with multiple abscesses/ulcers of her bilateral lower extremities with the largest being in the groin and medial thigh. Will need debridement depending upon goals of care. Currently obtunded. Blood cultures are without growth and anticipate they will remain without growth as she has been treated with Linezolid and meropenem. Agree with continuing linezolid for antitoxin effects as well as gram positive coverage along with meropenem. Given current health status she is at high risk for complicated healing and further infection going forward. Vasopressor support and dialysis per primary team and nephrology.    PLAN:  1. Continue current dose of linezolid and meropenem for broad spectrum coverage.  2. Monitor blood cultures for bacteremia if present.  3. Agree with establishing goals of care.  4. Wound care per General Surgery recommendations.  5. Remaining care per primary team.    Active Problems:   Septic shock ([place])   Acute respiratory failure with hypoxia ([place])   Acute metabolic encephalopathy   . Chlorhexidine Gluconate Cloth  6 each Topical Daily  . [START ON 01/03/2021] Chlorhexidine Gluconate Cloth  6 each Topical Q0600  . heparin  5,000 Units Subcutaneous Q8H  . hydrocortisone sod succinate (SOLU-CORTEF) inj  50 mg Intravenous Q6H  . insulin aspart  0-9 Units Subcutaneous Q4H     HPI: Theresa Russell is a 60 y.o. female with previous medical history of COPD on 2L [place], hypertension, atrial fibrillation, Type 2 diabetes and ESRD on hemodialysis (T, Th, S) admitted with chronic wounds to her lower extremities, groin, glutes, and coccyx with history of necrotizing fascitis.   Theresa Russell was initially  at a skilled facility until 5/9 which she was transferred to the hospital in [place] secondary to altered mental status. She was hypoxic and hypotensive. Treatment for sepsis was initiated and resuscitated with 3L of fluid and started on Linezolid and Meropenem. CT head with no acute processes. CT chest/abdomen/pelvis was negative for PE with extensive infiltrate of both lungs, air fluid collection of the left anterior perineum and left medial thigh measuring 5.5 cm concerning for abscess.   Theresa Russell has been afebrile since arriving to [facility] with WBC count of 15.4. Continued on Linezolid and Meropenem. Blood cultures have been without growth in less than 24 hours. General Surgery lanced the left medial thigh abscess and found it to track and likely connect with posterior wound with ongoing purulent drainage. Goals of care discussion was recommended prior to any additional aggressive debridements.   Theresa Russell is obtunded upon interview with no family present at the bedside. History has been obtained from chart review.   Review of Systems: Review of Systems  Unable to perform ROS: Mental status change     No past medical history on file.     No family history on file.  Allergies  Allergen Reactions  . Atorvastatin Other (See Comments)    Unknown reaction  . Canagliflozin Other (See Comments)    Unknown   . Levofloxacin Other (See Comments)    Unknown  . Lisinopril Other (See Comments)    Unknown  . Other     Orange dye   .  Vancomycin Other (See Comments)    Unknown    OBJECTIVE: Blood pressure 102/83, pulse (!) 106, temperature (!) 97.3 F (36.3 C), temperature source Axillary, resp. rate (!) 0, height 5\' 6"  (1.676 m), weight 95.5 kg, SpO2 94 %.  Physical Exam Constitutional:      General: She is not in acute distress.    Appearance: She is well-developed. She is obese.     Comments: Lying in bed with head of bed elevated; obtunded.   Cardiovascular:     Rate  and Rhythm: Regular rhythm. Tachycardia present.     Comments: Dialysis cathter present in right upper chest.  Pulmonary:     Effort: Pulmonary effort is normal.     Breath sounds: Normal breath sounds.  Skin:    General: Skin is warm and dry.     Comments: Multiple wounds/ulcers on her bilateral lower extremities that are packed with gauze with the largest in the groin/left thigh.   Neurological:     Mental Status: She is alert.     Lab Results Lab Results  Component Value Date   WBC 15.4 (H) 01/02/2021   HGB 8.7 (L) 01/02/2021   HCT 29.3 (L) 01/02/2021   MCV 94.5 01/02/2021   PLT 140 (L) 01/02/2021    Lab Results  Component Value Date   CREATININE 4.32 (H) 01/02/2021   BUN 56 (H) 01/02/2021   NA 133 (L) 01/02/2021   K 5.2 (H) 01/02/2021   CL 99 01/02/2021   CO2 21 (L) 01/02/2021    Lab Results  Component Value Date   ALT 7 01/01/2021   AST 16 01/01/2021   ALKPHOS 198 (H) 01/01/2021   BILITOT 0.7 01/01/2021     Microbiology: Recent Results (from the past 240 hour(s))  MRSA PCR Screening     Status: None   Collection Time: 01/01/21  7:04 PM   Specimen: Nasopharyngeal  Result Value Ref Range Status   MRSA by PCR NEGATIVE NEGATIVE Final    Comment:        The GeneXpert MRSA Assay (FDA approved for NASAL specimens only), is one component of a comprehensive MRSA colonization surveillance program. It is not intended to diagnose MRSA infection nor to guide or monitor treatment for MRSA infections. Performed at [facility], [place] [address] [place], [place] [address]   Culture, blood (Routine X 2) w Reflex to ID Panel     Status: None (Preliminary result)   Collection Time: 01/01/21  9:51 PM   Specimen: BLOOD LEFT FOREARM  Result Value Ref Range Status   Specimen Description BLOOD LEFT FOREARM  Final   Special Requests   Final    BOTTLES DRAWN AEROBIC AND ANAEROBIC Blood Culture adequate volume   Culture   Final    NO GROWTH < 12 HOURS Performed at  [facility], [place] [address], [place], [place] [address]    Report Status PENDING  Incomplete  Culture, blood (Routine X 2) w Reflex to ID Panel     Status: None (Preliminary result)   Collection Time: 01/01/21 10:01 PM   Specimen: BLOOD  Result Value Ref Range Status   Specimen Description BLOOD RIGHT ANTECUBITAL  Final   Special Requests   Final    BOTTLES DRAWN AEROBIC AND ANAEROBIC Blood Culture adequate volume   Culture   Final    NO GROWTH < 12 HOURS Performed at [facility], [place] [address], [place], [place] [address]    Report Status  PENDING  Incomplete     Terri Piedra, NP [facility] for Infectious Disease [facility]  01/02/2021  3:11 PM

## 2021-01-02 NOTE — Progress Notes (Signed)
Initial Nutrition Assessment  DOCUMENTATION CODES:   Obesity unspecified  INTERVENTION:   If within pt's GOC, recommend Cortrak NG tube placement and initiation of enteral nutrition. Recommend: - Vital 1.5 @ 60 ml/hr (1440 ml/day) - ProSource TF 45 ml QID  Recommended tube feeding regimen would provide 2320 kcal, 141 grams of protein, and 1100 ml of H2O.  NUTRITION DIAGNOSIS:   Moderate Malnutrition related to chronic illness (ESRD, COPD, CHF) as evidenced by mild fat depletion,moderate muscle depletion.  GOAL:   Patient will meet greater than or equal to 90% of their needs  MONITOR:   Diet advancement,Labs,Weight trends,Skin,I & O's  REASON FOR ASSESSMENT:   Rounds    ASSESSMENT:   60 year old female who [facility] to [facility] on 5/10 from [facility]. Pt found to be septic with multiple wounds draining purulent material. PMH of COPD, HTN, CHF, atrial fibrillation, depression, T2DM, ESRD on HD, multiple wounds, necrotizing fasciitis. Pt admitted with sepsis.   5/10 - I&D of L medial thigh abscess  Discussed pt with RN and during ICU rounds. Pt is currently NPO at this time. Per RN, pt is lethargic and not communicating. Surgery is consulted.  Per CCM MD, pt's son wishes to pursue any treatment including surgical options at this time. Per CCM MD, pt may require Cortrak placement if mental status does not improve and diet unable to be advanced.  Nephrology consulted. Per notes, pt's EDW is 93 kg. No absolute indications for HD right now but will likely need tomorrow per notes. If pt does not tolerate HD, may need CRRT.  RD attempted to speak with pt at bedside. Pt did not respond to RD voice or touch.  Medications reviewed and include: IV solu-medrol, SSI q 4 hours, NS @ 10 ml/hr, IV abx, levophed  Labs reviewed: sodium 133, potassium 5.2, phosphorus 6.5, lactic acid 2.1, hemoglobin 8.7 CBG's: 103-201 x 24 hours  I/O's: +2.7 L since admit  NUTRITION - FOCUSED  PHYSICAL EXAM:  Flowsheet Row Most Recent Value  Orbital Region Mild depletion  Upper Arm Region Mild depletion  Thoracic and Lumbar Region No depletion  Buccal Region No depletion  Temple Region No depletion  Clavicle Bone Region Moderate depletion  Clavicle and Acromion Bone Region Moderate depletion  Scapular Bone Region Unable to assess  Dorsal Hand Mild depletion  Patellar Region Moderate depletion  Anterior Thigh Region Moderate depletion  Posterior Calf Region Moderate depletion  Edema (RD Assessment) Mild  Hair Reviewed  Eyes Unable to assess  Mouth Unable to assess  Skin Reviewed  Nails Reviewed       Diet Order:   Diet Order            Diet NPO time specified  Diet effective now                 EDUCATION NEEDS:   Not appropriate for education at this time  Skin:  Skin Assessment: Skin Integrity Issues: Stage II: L ischial tuberosity Unstageable: sacrum Incisions: L thigh x 2 Other: pressure injury to R hip; skin tear to groin; multiple non-pressure wounds to feet, ankles, legs  Last BM:  01/01/21  Height:   Ht Readings from Last 1 Encounters:  01/02/21 5\' 6"  (1.676 m)    Weight:   Wt Readings from Last 1 Encounters:  01/02/21 95.5 kg    Ideal Body Weight:  59.1 kg  BMI:  Body mass index is 33.98 kg/m.  Estimated Nutritional Needs:   Kcal:  2200-2400  Protein:  130-150 grams  Fluid:  1000 ml + UOP    Gustavus Bryant, MS, RD, LDN Inpatient Clinical Dietitian Please see AMiON for contact information.

## 2021-01-02 NOTE — Progress Notes (Signed)
   Subjective/Chief Complaint: obtunded   Objective: Vital signs in last 24 hours: Temp:  [97.1 F (36.2 C)-98 F (36.7 C)] 97.1 F (36.2 C) (05/11 0745) Pulse Rate:  [98-123] 100 (05/11 0800) Resp:  [5-25] 18 (05/11 0600) BP: (78-109)/(67-87) 85/69 (05/11 0800) SpO2:  [85 %-97 %] 97 % (05/11 0800) Weight:  [95.5 kg] 95.5 kg (05/11 0500) Last BM Date: 01/01/21  Intake/Output from previous day: 05/10 0701 - 05/11 0700 In: 1138.3 [I.V.:838.3; IV Piggyback:300] Out: -  Intake/Output this shift: Total I/O In: 85.1 [I.V.:85.1] Out: -   General appearance: obtunded Resp: clear to auscultation bilaterally Cardio: regular rate and rhythm GI: abd soft. open wound left thigh draining pus  Lab Results:  Recent Labs    01/01/21 2039 01/02/21 0130  WBC 16.2* 15.4*  HGB 8.8* 8.7*  HCT 29.3* 29.3*  PLT 158 140*   BMET Recent Labs    01/01/21 2039 01/02/21 0130  NA 133* 133*  K 5.6* 5.2*  CL 98 99  CO2 20* 21*  GLUCOSE 162* 177*  BUN 55* 56*  CREATININE 4.18* 4.32*  CALCIUM 7.9* 8.0*   PT/INR No results for input(s): LABPROT, INR in the last 72 hours. ABG No results for input(s): PHART, HCO3 in the last 72 hours.  Invalid input(s): PCO2, PO2  Studies/Results: No results found.  Anti-infectives: Anti-infectives (From admission, onward)   Start     Dose/Rate Route Frequency Ordered Stop   01/02/21 1500  meropenem (MERREM) 500 mg in sodium chloride 0.9 % 100 mL IVPB        500 mg 200 mL/hr over 30 Minutes Intravenous Every 24 hours 01/01/21 2010     01/01/21 2200  linezolid (ZYVOX) IVPB 600 mg        600 mg 300 mL/hr over 60 Minutes Intravenous Every 12 hours 01/01/21 2010        Assessment/Plan: s/p * No surgery found * Wounds could be explored and opened up more in the OR   Prognosis is poor either way. I agree that medical team and family should decide on goals of care prior to subjecting her to more surgery.  Continue dressing changes and abx Will  follow  LOS: 1 day    Autumn Messing III 01/02/2021

## 2021-01-02 NOTE — Progress Notes (Signed)
  Echocardiogram 2D Echocardiogram has been performed.  Theresa Russell 01/02/2021, 5:08 PM

## 2021-01-03 DIAGNOSIS — N186 End stage renal disease: Secondary | ICD-10-CM

## 2021-01-03 DIAGNOSIS — A419 Sepsis, unspecified organism: Secondary | ICD-10-CM | POA: Diagnosis not present

## 2021-01-03 DIAGNOSIS — R6521 Severe sepsis with septic shock: Secondary | ICD-10-CM | POA: Diagnosis not present

## 2021-01-03 DIAGNOSIS — L02419 Cutaneous abscess of limb, unspecified: Secondary | ICD-10-CM | POA: Diagnosis not present

## 2021-01-03 LAB — BASIC METABOLIC PANEL
Anion gap: 16 — ABNORMAL HIGH (ref 5–15)
Anion gap: 11 (ref 5–15)
BUN: 63 mg/dL — ABNORMAL HIGH (ref 6–20)
BUN: 26 mg/dL — ABNORMAL HIGH (ref 6–20)
CO2: 19 mmol/L — ABNORMAL LOW (ref 22–32)
CO2: 25 mmol/L (ref 22–32)
Calcium: 7.8 mg/dL — ABNORMAL LOW (ref 8.9–10.3)
Calcium: 7.7 mg/dL — ABNORMAL LOW (ref 8.9–10.3)
Chloride: 98 mmol/L (ref 98–111)
Chloride: 96 mmol/L — ABNORMAL LOW (ref 98–111)
Creatinine, Ser: 4.65 mg/dL — ABNORMAL HIGH (ref 0.44–1.00)
Creatinine, Ser: 2.41 mg/dL — ABNORMAL HIGH (ref 0.44–1.00)
GFR, Estimated: 10 mL/min — ABNORMAL LOW (ref >60)
GFR, Estimated: 23 mL/min — ABNORMAL LOW (ref >60)
Glucose, Bld: 160 mg/dL — ABNORMAL HIGH (ref 70–99)
Glucose, Bld: 144 mg/dL — ABNORMAL HIGH (ref 70–99)
Potassium: 5.4 mmol/L — ABNORMAL HIGH (ref 3.5–5.1)
Potassium: 3.5 mmol/L (ref 3.5–5.1)
Sodium: 133 mmol/L — ABNORMAL LOW (ref 135–145)
Sodium: 132 mmol/L — ABNORMAL LOW (ref 135–145)

## 2021-01-03 LAB — GLUCOSE, CAPILLARY
Glucose-Capillary: 106 mg/dL — ABNORMAL HIGH (ref 70–99)
Glucose-Capillary: 136 mg/dL — ABNORMAL HIGH (ref 70–99)
Glucose-Capillary: 147 mg/dL — ABNORMAL HIGH (ref 70–99)
Glucose-Capillary: 149 mg/dL — ABNORMAL HIGH (ref 70–99)
Glucose-Capillary: 153 mg/dL — ABNORMAL HIGH (ref 70–99)
Glucose-Capillary: 158 mg/dL — ABNORMAL HIGH (ref 70–99)

## 2021-01-03 LAB — CBC
HCT: 31.5 % — ABNORMAL LOW (ref 36.0–46.0)
Hemoglobin: 9.3 g/dL — ABNORMAL LOW (ref 12.0–15.0)
MCH: 27.9 pg (ref 26.0–34.0)
MCHC: 29.5 g/dL — ABNORMAL LOW (ref 30.0–36.0)
MCV: 94.6 fL (ref 80.0–100.0)
Platelets: 179 10*3/uL (ref 150–400)
RBC: 3.33 MIL/uL — ABNORMAL LOW (ref 3.87–5.11)
RDW: 17 % — ABNORMAL HIGH (ref 11.5–15.5)
WBC: 14.1 10*3/uL — ABNORMAL HIGH (ref 4.0–10.5)
nRBC: 2.2 % — ABNORMAL HIGH (ref 0.0–0.2)

## 2021-01-03 LAB — MAGNESIUM: Magnesium: 1.9 mg/dL (ref 1.7–2.4)

## 2021-01-03 MED ORDER — SODIUM CHLORIDE 0.9 % IV SOLN: 250.0000 mL | INTRAVENOUS | Status: DC

## 2021-01-03 MED ORDER — AMIODARONE HCL IN DEXTROSE 360-4.14 MG/200ML-% IV SOLN
60.0000 mg/h | INTRAVENOUS | Status: AC
  Administered 2021-01-03: 60 mg/h via INTRAVENOUS
  Filled 2021-01-03 (×2): qty 200

## 2021-01-03 MED ORDER — AMIODARONE LOAD VIA INFUSION
150.0000 mg | Freq: Once | INTRAVENOUS | Status: AC
  Administered 2021-01-03: 150 mg via INTRAVENOUS
  Filled 2021-01-03: qty 83.34

## 2021-01-03 MED ORDER — HEPARIN SODIUM (PORCINE) 1000 UNIT/ML IJ SOLN
INTRAMUSCULAR | Status: AC
  Filled 2021-01-03: qty 4

## 2021-01-03 MED ORDER — DARBEPOETIN ALFA 100 MCG/0.5ML IJ SOSY
100.0000 ug | PREFILLED_SYRINGE | INTRAMUSCULAR | Status: DC
  Administered 2021-01-03: 100 ug via INTRAVENOUS
  Filled 2021-01-03 (×2): qty 0.5

## 2021-01-03 MED ORDER — LACTATED RINGERS IV BOLUS
500.0000 mL | Freq: Once | INTRAVENOUS | Status: AC
  Administered 2021-01-03: 500 mL via INTRAVENOUS

## 2021-01-03 MED ORDER — PHENYLEPHRINE HCL-NACL 10-0.9 MG/250ML-% IV SOLN
25.0000 ug/min | INTRAVENOUS | Status: DC
  Administered 2021-01-03: 50 ug/min via INTRAVENOUS
  Administered 2021-01-04: 8 ug/min via INTRAVENOUS
  Administered 2021-01-05: 12.5 ug/min via INTRAVENOUS
  Filled 2021-01-03 (×4): qty 250

## 2021-01-03 MED ORDER — AMIODARONE HCL IN DEXTROSE 360-4.14 MG/200ML-% IV SOLN
60.0000 mg/h | INTRAVENOUS | Status: DC
  Administered 2021-01-04 – 2021-01-07 (×6): 30 mg/h via INTRAVENOUS
  Administered 2021-01-08: 60 mg/h via INTRAVENOUS
  Administered 2021-01-08: 30 mg/h via INTRAVENOUS
  Administered 2021-01-08 – 2021-01-09 (×4): 60 mg/h via INTRAVENOUS
  Filled 2021-01-03 (×13): qty 200

## 2021-01-03 MED ORDER — SODIUM CHLORIDE 0.9% FLUSH: 10.0000 mL | Freq: Two times a day (BID) | INTRAVENOUS | Status: DC

## 2021-01-03 MED ORDER — DARBEPOETIN ALFA 100 MCG/0.5ML IJ SOSY
100.0000 ug | PREFILLED_SYRINGE | Freq: Once | INTRAMUSCULAR | Status: AC
  Administered 2021-01-03: 100 ug via SUBCUTANEOUS
  Filled 2021-01-03: qty 0.5

## 2021-01-03 MED ORDER — DARBEPOETIN ALFA 100 MCG/0.5ML IJ SOSY: 100.0000 ug | PREFILLED_SYRINGE | INTRAMUSCULAR | Status: DC

## 2021-01-03 MED ORDER — SODIUM CHLORIDE 0.9% FLUSH: 10.0000 mL | INTRAVENOUS | Status: DC | PRN

## 2021-01-03 NOTE — Consult Note (Addendum)
[facility] Nurse Consult Note: Patient receiving care in [facility] 3M09 Patient is from a SNF admitted with multiple wounds, hx of necrotizing fascitis in the upper thighs in which Gen. Surgery is following.  Reason for Consult: Multiple wounds Wound type: Left anterior foot full thickness wound measures 1.2 x 1.3 x 0.2 pink Left medial LE full thickness measures 3 x 2 x 0.3. 75% pink 25% yellow Left lateral LE measures 7 x 5 x 0.1. 75% brown crusted, 25% pink Right medial LE full thickness measures 6.5 x 3.5 x 0.3, 100% pink with muscle  Right anterior foot full thickness wound measures 4 x 2 x 0.5, 100% yellow. Left buttock stage 4 measures 5 x 4 x 5 with tunneling at twelve oclock, 5 cm in depth Left lower buttock stage 4 measures 7 x 3.5 x 6, with tunneling at 6-7 oclock that is 15+cm in depth, red and friable. Right gluteal fold MASD/IAD measures 1 x 1 x 0.1 100% pink Pressure Injury POA: Yes Dressing procedure/placement/frequency: Please continue to dress all wounds that I have addressed today with moistened saline gauze, cover with 4 x 4 and foam dressings. Change BID or PRN soiling.   Monitor the wound area(s) for worsening of condition such as: Signs/symptoms of infection, increase in size, development of or worsening of odor, development of pain, or increased pain at the affected locations.   Notify the medical team if any of these develop.  Thank you for the consult. [facility] nurse will not follow at this time.   Please re-consult the [place] team if needed.  Cathlean Marseilles Tamala Julian, MSN, RN, [place], Lysle Pearl, [facility] Wound Treatment Associate Pager [phone]

## 2021-01-03 NOTE — Progress Notes (Signed)
[facility] for Infectious Disease  Date of Admission:  01/01/2021           Reason for visit: Follow up on abscess  Current antibiotics: Linezolid Meropenem  ASSESSMENT:    1. Left thigh and perineal abscess: s/p I&D at bedside 01/01/21.  Currently on meropenem and linezolid and anticipating return to OR for further debridement tomorrow.  2. Septic shock: secondary to above 3. ESRD on HD  PLAN:    . Continue linezolid and meropenem dosed renally . OR tomorrow and will follow up cultures from this . Will follow   Principal Problem:   Septic shock ([place]) Active Problems:   Acute respiratory failure with hypoxia (HCC)   Acute metabolic encephalopathy   Thigh abscess   ESRD (end stage renal disease) (HCC)    MEDICATIONS:    Scheduled Meds: . Chlorhexidine Gluconate Cloth  6 each Topical Daily  . Chlorhexidine Gluconate Cloth  6 each Topical Q0600  . darbepoetin (ARANESP) injection - DIALYSIS  100 mcg Intravenous Q Thu-HD  . heparin  5,000 Units Subcutaneous Q8H  . hydrocortisone sod succinate (SOLU-CORTEF) inj  50 mg Intravenous Q6H  . insulin aspart  0-9 Units Subcutaneous Q4H   Continuous Infusions: . sodium chloride 10 mL/hr at 01/03/21 0600  . linezolid (ZYVOX) IV Stopped (01/02/21 2201)  . meropenem (MERREM) IV Stopped (01/02/21 1513)  . norepinephrine (LEVOPHED) Adult infusion 2 mcg/min (01/03/21 0600)   PRN Meds:.docusate sodium, ipratropium-albuterol, polyethylene glycol  SUBJECTIVE:    More alert this morning and answering questions appropriately.  No fevers, chills.  Tolerating abx.  Breathing stable.  Reports pain all over but no other complaints.  Review of Systems  All other systems reviewed and are negative.     OBJECTIVE:   Blood pressure 98/81, pulse (!) 113, temperature (!) 97.2 F (36.2 C), temperature source Oral, resp. rate (!) 0, height 5\' 6"  (1.676 m), weight 96.7 kg, SpO2 95 %. Body mass index is 34.41 kg/m.  Physical  Exam Constitutional:      Comments: Chronically ill appearing woman, NAD, lying in bed.   HENT:     Head: Normocephalic and atraumatic.  Eyes:     Extraocular Movements: Extraocular movements intact.     Conjunctiva/sclera: Conjunctivae normal.  Skin:    General: Skin is warm and dry.     Findings: Lesion present.  Neurological:     General: No focal deficit present.     Mental Status: She is oriented to person, place, and time.  Psychiatric:        Mood and Affect: Mood normal.        Behavior: Behavior normal.      Lab Results: Lab Results  Component Value Date   WBC 14.1 (H) 01/03/2021   HGB 9.3 (L) 01/03/2021   HCT 31.5 (L) 01/03/2021   MCV 94.6 01/03/2021   PLT 179 01/03/2021    Lab Results  Component Value Date   NA 133 (L) 01/03/2021   K 5.4 (H) 01/03/2021   CO2 19 (L) 01/03/2021   GLUCOSE 160 (H) 01/03/2021   BUN 63 (H) 01/03/2021   CREATININE 4.65 (H) 01/03/2021   CALCIUM 7.8 (L) 01/03/2021   GFRNONAA 10 (L) 01/03/2021    Lab Results  Component Value Date   ALT 7 01/01/2021   AST 16 01/01/2021   ALKPHOS 198 (H) 01/01/2021   BILITOT 0.7 01/01/2021    No results found for: CRP  No results found for: ESRSEDRATE  I have reviewed the micro and lab results in Epic.  Imaging: DG CHEST PORT 1 VIEW  Result Date: 01/02/2021 CLINICAL DATA:  Hypoxia. EXAM: PORTABLE CHEST 1 VIEW COMPARISON:  11/26/2020 FINDINGS: Dialysis catheter tip at high right atrium. Midline trachea. Cardiomegaly accentuated by AP portable technique. Atherosclerosis in the transverse aorta. Mild right hemidiaphragm elevation. No pleural effusion or pneumothorax. Right greater than left lower lung predominant interstitial and airspace disease is relatively similar to on the prior, given differences in technique. IMPRESSION: Cardiomegaly with interstitial and airspace disease which could represent infection or pulmonary edema. Aortic Atherosclerosis (ICD10-I70.0). Electronically Signed   By:  Abigail Miyamoto M.D.   On: 01/02/2021 14:03   ECHOCARDIOGRAM LIMITED  Result Date: 01/02/2021    ECHOCARDIOGRAM LIMITED REPORT   Patient Name:   Theresa Russell Date of Exam: 01/02/2021 Medical Rec #:  [id number]           Height:       66.0 in Accession #:    [id number]          Weight:       210.5 lb Date of Birth:  1960-09-05          BSA:          2.044 m Patient Age:    60 years            BP:           90/70 mmHg Patient Gender: F                   HR:           105 bpm. Exam Location:  Inpatient Procedure: Limited Echo Indications:    Hx of sepsis  Referring Phys: [id number] CHI JANE ELLISON IMPRESSIONS  1. Left ventricular ejection fraction, by estimation, is 55 to 60%. The left ventricle has normal function. There is moderate asymmetric left ventricular hypertrophy.  2. Right ventricular systolic function is moderately reduced. The right ventricular size is normal.  3. The mitral valve is normal in structure. Mild mitral valve regurgitation.  4. The aortic valve is tricuspid. Aortic valve regurgitation is not visualized.  5. The inferior vena cava is dilated in size with <50% respiratory variability, suggesting right atrial pressure of 15 mmHg. FINDINGS  Left Ventricle: Left ventricular ejection fraction, by estimation, is 55 to 60%. The left ventricle has normal function. The left ventricular internal cavity size was normal in size. There is moderate asymmetric left ventricular hypertrophy. Right Ventricle: The right ventricular size is normal. Right ventricular systolic function is moderately reduced. Left Atrium: Left atrial size was normal in size. Right Atrium: Right atrial size was normal in size. Pericardium: There is no evidence of pericardial effusion. Mitral Valve: The mitral valve is normal in structure. Mild mitral valve regurgitation. Tricuspid Valve: The tricuspid valve is normal in structure. Tricuspid valve regurgitation is mild. Aortic Valve: The aortic valve is tricuspid. Aortic valve  regurgitation is not visualized. Pulmonic Valve: The pulmonic valve was normal in structure. Pulmonic valve regurgitation is mild. Aorta: The aortic root and ascending aorta are structurally normal, with no evidence of dilitation. Venous: The inferior vena cava is dilated in size with less than 50% respiratory variability, suggesting right atrial pressure of 15 mmHg. IAS/Shunts: No atrial level shunt detected by color flow Doppler. LEFT VENTRICLE PLAX 2D LVIDd:         3.70 cm LVIDs:         2.20 cm LV PW:  1.10 cm LV IVS:        1.60 cm LVOT diam:     1.80 cm LVOT Area:     2.54 cm  RIGHT VENTRICLE          IVC RV Basal diam:  3.10 cm  IVC diam: 2.35 cm RV Mid diam:    2.60 cm LEFT ATRIUM             Index       RIGHT ATRIUM           Index LA diam:        4.60 cm 2.25 cm/m  RA Area:     15.40 cm LA Vol (A2C):   63.9 ml 31.26 ml/m RA Volume:   35.40 ml  17.32 ml/m LA Vol (A4C):   48.1 ml 23.53 ml/m LA Biplane Vol: 55.3 ml 27.06 ml/m   AORTA Ao Root diam: 3.60 cm Ao Asc diam:  3.10 cm  SHUNTS Systemic Diam: 1.80 cm Dorris Carnes MD Electronically signed by Dorris Carnes MD Signature Date/Time: 01/02/2021/5:09:31 PM    Final      Imaging  independently reviewed in Epic.    [facility] for Infectious [facility] [phone] pager 01/03/2021, 12:18 PM

## 2021-01-03 NOTE — Progress Notes (Signed)
Subjective:  Alert this AM without complaints.  Pressors being weaned-  Just on 2 of levo and SBP 106- [place] 02   Objective Vital signs in last 24 hours: Vitals:   01/03/21 0400 01/03/21 0500 01/03/21 0600 01/03/21 0630  BP: (!) 89/72 100/66 100/75   Pulse: (!) 113 (!) 119 (!) 128   Resp: 12 12 (!) 9   Temp:      TempSrc:      SpO2: 96% 96% 95%   Weight:    96.7 kg  Height:       Weight change: 1.2 kg  Intake/Output Summary (Last 24 hours) at 01/03/2021 0806 Last data filed at 01/03/2021 0600 Gross per 24 hour  Intake 2078.15 ml  Output --  Net 2078.15 ml   Dialyzes at [facility]  TTS 4 hours  EDW 93 kg. HD [place] 2/2.5, Dialyzer unknown, Heparin yes- 2000 per hour. Access TDC.BFR 350/dfr 500 epogen 8000 per tx, iron weekly    Assessment/Plan: 60 year old BF with multiple medical issues including ESRD and chronic wounds.  She now has what appears to be sepsis in the setting of these wounds requiring pressor support  1 wounds/sepsis-  S/p a brief debridement per surgery-  Likely needs more-  Also meropenam and zyvox 2 ESRD: normally TTS at [facility] via [facility].   Planning on HD today.  Since pressors seem to be down and patient globally improved will do IHD with no volume removal and not CRRT.    3 Hypertension: hypotensive likely due to sepsis-  Volume seems generous based on edema but also likely third spacing due to hypoalbuminemia.  No volume removal with HD today  4. Anemia of ESRD: chronic issue-  Will cont ESA-  No iron due to infections 5. 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  Phos up right now but not emergent to treat -  Will come down with HD 6. Dispo-  I do not know this lady so do not know her baseline MS-  I agree in theory that her quality of life is not great.  Apparently son is POA and wishes for continued aggressive therapy.  She has perked up quite a bit today -  May do moreso after HD    [place]: Basic Metabolic Panel: Recent Labs  Lab 01/01/21 2039 01/02/21 0130 01/03/21 0607  NA 133* 133* 133*  K 5.6* 5.2* 5.4*  CL 98 99 98  CO2 20* 21* 19*  GLUCOSE 162* 177* 160*  BUN 55* 56* 63*  CREATININE 4.18* 4.32* 4.65*  CALCIUM 7.9* 8.0* 7.8*  PHOS 6.1* 6.5*  --    Liver Function Tests: Recent Labs  Lab 01/01/21 2039  AST 16  ALT 7  ALKPHOS 198*  BILITOT 0.7  PROT 6.2*  ALBUMIN 2.3*   No results for input(s): LIPASE, AMYLASE in the last 168 hours. No results for input(s): AMMONIA in the last 168 hours. CBC: Recent Labs  Lab 01/01/21 2039 01/02/21 0130 01/03/21 0607  WBC 16.2* 15.4* 14.1*  NEUTROABS 15.2*  --   --   HGB 8.8* 8.7* 9.3*  HCT 29.3* 29.3* 31.5*  MCV 94.5 94.5 94.6  PLT 158 140* 179   Cardiac Enzymes: No results for input(s): CKTOTAL, CKMB, CKMBINDEX, TROPONINI in the last 168 hours. CBG: Recent Labs  Lab 01/02/21 1542 01/02/21 1938 01/02/21 2322 01/03/21 0328 01/03/21 0759  GLUCAP 156* 174* 148*  147* 153*    Iron Studies: No results for input(s): IRON, TIBC, TRANSFERRIN, FERRITIN in the last 72 hours. Studies/Results: DG CHEST PORT 1 VIEW  Result Date: 01/02/2021 CLINICAL DATA:  Hypoxia. EXAM: PORTABLE CHEST 1 VIEW COMPARISON:  11/26/2020 FINDINGS: Dialysis catheter tip at high right atrium. Midline trachea. Cardiomegaly accentuated by AP portable technique. Atherosclerosis in the transverse aorta. Mild right hemidiaphragm elevation. No pleural effusion or pneumothorax. Right greater than left lower lung predominant interstitial and airspace disease is relatively similar to on the prior, given differences in technique. IMPRESSION: Cardiomegaly with interstitial and airspace disease which could represent infection or pulmonary edema. Aortic Atherosclerosis (ICD10-I70.0). Electronically Signed   By: Abigail Miyamoto M.D.   On: 01/02/2021 14:03   ECHOCARDIOGRAM LIMITED  Result Date: 01/02/2021    ECHOCARDIOGRAM LIMITED  REPORT   Patient Name:   Theresa Russell Date of Exam: 01/02/2021 Medical Rec #:  [id number]           Height:       66.0 in Accession #:    [id number]          Weight:       210.5 lb Date of Birth:  [birth year]          BSA:          2.044 m Patient Age:    23 years            BP:           90/70 mmHg Patient Gender: F                   HR:           105 bpm. Exam Location:  Inpatient Procedure: Limited Echo Indications:    Hx of sepsis  Referring Phys: [id number] [facility]I JANE ELLISON IMPRESSIONS  1. Left ventricular ejection fraction, by estimation, is 55 to 60%. The left ventricle has normal function. There is moderate asymmetric left ventricular hypertrophy.  2. Right ventricular systolic function is moderately reduced. The right ventricular size is normal.  3. The mitral valve is normal in structure. Mild mitral valve regurgitation.  4. The aortic valve is tricuspid. Aortic valve regurgitation is not visualized.  5. The inferior vena cava is dilated in size with <50% respiratory variability, suggesting right atrial pressure of 15 mmHg. FINDINGS  Left Ventricle: Left ventricular ejection fraction, by estimation, is 55 to 60%. The left ventricle has normal function. The left ventricular internal cavity size was normal in size. There is moderate asymmetric left ventricular hypertrophy. Right Ventricle: The right ventricular size is normal. Right ventricular systolic function is moderately reduced. Left Atrium: Left atrial size was normal in size. Right Atrium: Right atrial size was normal in size. Pericardium: There is no evidence of pericardial effusion. Mitral Valve: The mitral valve is normal in structure. Mild mitral valve regurgitation. Tricuspid Valve: The tricuspid valve is normal in structure. Tricuspid valve regurgitation is mild. Aortic Valve: The aortic valve is tricuspid. Aortic valve regurgitation is not visualized. Pulmonic Valve: The pulmonic valve was normal in structure. Pulmonic valve regurgitation is  mild. Aorta: The aortic root and ascending aorta are structurally normal, with no evidence of dilitation. Venous: The inferior vena cava is dilated in size with less than 50% respiratory variability, suggesting right atrial pressure of 15 mmHg. IAS/Shunts: No atrial level shunt detected by color flow Doppler. LEFT VENTRICLE PLAX 2D LVIDd:         3.70 cm LVIDs:  2.20 cm LV PW:         1.10 cm LV IVS:        1.60 cm LVOT diam:     1.80 cm LVOT Area:     2.54 cm  RIGHT VENTRICLE          IVC RV Basal diam:  3.10 cm  IVC diam: 2.35 cm RV Mid diam:    2.60 cm LEFT ATRIUM             Index       RIGHT ATRIUM           Index LA diam:        4.60 cm 2.25 cm/m  RA Area:     15.40 cm LA Vol (A2C):   63.9 ml 31.26 ml/m RA Volume:   35.40 ml  17.32 ml/m LA Vol (A4C):   48.1 ml 23.53 ml/m LA Biplane Vol: 55.3 ml 27.06 ml/m   AORTA Ao Root diam: 3.60 cm Ao Asc diam:  3.10 cm  SHUNTS Systemic Diam: 1.80 cm Dorris Carnes MD Electronically signed by Dorris Carnes MD Signature Date/Time: 01/02/2021/5:09:31 PM    Final    Medications: Infusions: . sodium chloride 10 mL/hr at 01/03/21 0600  . linezolid (ZYVOX) IV Stopped (01/02/21 2201)  . meropenem (MERREM) IV Stopped (01/02/21 1513)  . norepinephrine (LEVOPHED) Adult infusion 2 mcg/min (01/03/21 0600)    Scheduled Medications: . Chlorhexidine Gluconate Cloth  6 each Topical Daily  . Chlorhexidine Gluconate Cloth  6 each Topical Q0600  . heparin  5,000 Units Subcutaneous Q8H  . hydrocortisone sod succinate (SOLU-CORTEF) inj  50 mg Intravenous Q6H  . insulin aspart  0-9 Units Subcutaneous Q4H    have reviewed scheduled and prn medications.  Physical Exam: General:  Much more alert than yesterday !   No c/o's Heart: tachy Lungs: dec BS at bases Abdomen: soft, non tender Extremities: some pitting edema-  Wounds on LE esp medial left thigh Dialysis Access: right sided TDC-  No obvious infection    01/03/2021,8:06 AM  LOS: 2 days

## 2021-01-03 NOTE — H&P (View-Only) (Signed)
Subjective/Chief Complaint: More alert today and able to answer questions   Objective: Vital signs in last 24 hours: Temp:  [96.3 F (35.7 C)-97.6 F (36.4 C)] 97.2 F (36.2 C) (05/12 0700) Pulse Rate:  [98-128] 128 (05/12 0600) Resp:  [9-17] 9 (05/12 0600) BP: (82-103)/(60-84) 100/75 (05/12 0600) SpO2:  [93 %-98 %] 95 % (05/12 0600) Weight:  [96.7 kg] 96.7 kg (05/12 0630) Last BM Date: 01/02/21  Intake/Output from previous day: 05/11 0701 - 05/12 0700 In: 2163.2 [I.V.:441; IV Piggyback:1722.2] Out: -  Intake/Output this shift: No intake/output data recorded.  General appearance: arousable and answers questions Resp: clear to auscultation bilaterally Cardio: regular rate and rhythm GI: soft, multiple wounds on thigh and perirectal area draining pus  Lab Results:  Recent Labs    01/02/21 0130 01/03/21 0607  WBC 15.4* 14.1*  HGB 8.7* 9.3*  HCT 29.3* 31.5*  PLT 140* 179   BMET Recent Labs    01/02/21 0130 01/03/21 0607  NA 133* 133*  K 5.2* 5.4*  CL 99 98  CO2 21* 19*  GLUCOSE 177* 160*  BUN 56* 63*  CREATININE 4.32* 4.65*  CALCIUM 8.0* 7.8*   PT/INR No results for input(s): LABPROT, INR in the last 72 hours. ABG No results for input(s): PHART, HCO3 in the last 72 hours.  Invalid input(s): PCO2, PO2  Studies/Results: DG CHEST PORT 1 VIEW  Result Date: 01/02/2021 CLINICAL DATA:  Hypoxia. EXAM: PORTABLE CHEST 1 VIEW COMPARISON:  11/26/2020 FINDINGS: Dialysis catheter tip at high right atrium. Midline trachea. Cardiomegaly accentuated by AP portable technique. Atherosclerosis in the transverse aorta. Mild right hemidiaphragm elevation. No pleural effusion or pneumothorax. Right greater than left lower lung predominant interstitial and airspace disease is relatively similar to on the prior, given differences in technique. IMPRESSION: Cardiomegaly with interstitial and airspace disease which could represent infection or pulmonary edema. Aortic  Atherosclerosis (ICD10-I70.0). Electronically Signed   By: Abigail Miyamoto M.D.   On: 01/02/2021 14:03   ECHOCARDIOGRAM LIMITED  Result Date: 01/02/2021    ECHOCARDIOGRAM LIMITED REPORT   Patient Name:   Theresa Russell Date of Exam: 01/02/2021 Medical Rec #:  [id number]           Height:       66.0 in Accession #:    [id number]          Weight:       210.5 lb Date of Birth:  06/08/1961          BSA:          2.044 m Patient Age:    60 years            BP:           90/70 mmHg Patient Gender: F                   HR:           105 bpm. Exam Location:  Inpatient Procedure: Limited Echo Indications:    Hx of sepsis  Referring Phys: [id number] CHI JANE ELLISON IMPRESSIONS  1. Left ventricular ejection fraction, by estimation, is 55 to 60%. The left ventricle has normal function. There is moderate asymmetric left ventricular hypertrophy.  2. Right ventricular systolic function is moderately reduced. The right ventricular size is normal.  3. The mitral valve is normal in structure. Mild mitral valve regurgitation.  4. The aortic valve is tricuspid. Aortic valve regurgitation is not visualized.  5. The inferior vena cava is dilated  in size with <50% respiratory variability, suggesting right atrial pressure of 15 mmHg. FINDINGS  Left Ventricle: Left ventricular ejection fraction, by estimation, is 55 to 60%. The left ventricle has normal function. The left ventricular internal cavity size was normal in size. There is moderate asymmetric left ventricular hypertrophy. Right Ventricle: The right ventricular size is normal. Right ventricular systolic function is moderately reduced. Left Atrium: Left atrial size was normal in size. Right Atrium: Right atrial size was normal in size. Pericardium: There is no evidence of pericardial effusion. Mitral Valve: The mitral valve is normal in structure. Mild mitral valve regurgitation. Tricuspid Valve: The tricuspid valve is normal in structure. Tricuspid valve regurgitation is mild.  Aortic Valve: The aortic valve is tricuspid. Aortic valve regurgitation is not visualized. Pulmonic Valve: The pulmonic valve was normal in structure. Pulmonic valve regurgitation is mild. Aorta: The aortic root and ascending aorta are structurally normal, with no evidence of dilitation. Venous: The inferior vena cava is dilated in size with less than 50% respiratory variability, suggesting right atrial pressure of 15 mmHg. IAS/Shunts: No atrial level shunt detected by color flow Doppler. LEFT VENTRICLE PLAX 2D LVIDd:         3.70 cm LVIDs:         2.20 cm LV PW:         1.10 cm LV IVS:        1.60 cm LVOT diam:     1.80 cm LVOT Area:     2.54 cm  RIGHT VENTRICLE          IVC RV Basal diam:  3.10 cm  IVC diam: 2.35 cm RV Mid diam:    2.60 cm LEFT ATRIUM             Index       RIGHT ATRIUM           Index LA diam:        4.60 cm 2.25 cm/m  RA Area:     15.40 cm LA Vol (A2C):   63.9 ml 31.26 ml/m RA Volume:   35.40 ml  17.32 ml/m LA Vol (A4C):   48.1 ml 23.53 ml/m LA Biplane Vol: 55.3 ml 27.06 ml/m   AORTA Ao Root diam: 3.60 cm Ao Asc diam:  3.10 cm  SHUNTS Systemic Diam: 1.80 cm Dorris Carnes MD Electronically signed by Dorris Carnes MD Signature Date/Time: 01/02/2021/5:09:31 PM    Final     Anti-infectives: Anti-infectives (From admission, onward)   Start     Dose/Rate Route Frequency Ordered Stop   01/02/21 1500  meropenem (MERREM) 500 mg in sodium chloride 0.9 % 100 mL IVPB        500 mg 200 mL/hr over 30 Minutes Intravenous Every 24 hours 01/01/21 2010     01/01/21 2200  linezolid (ZYVOX) IVPB 600 mg        600 mg 300 mL/hr over 60 Minutes Intravenous Every 12 hours 01/01/21 2010        Assessment/Plan: s/p * No surgery found * will likely need I and D in OR. I have discussed this with pt and she is agreeable. I have tried to contact son but no answer. She will need to be dialyzed today. Will plan for debridement in OR tomorrow Continue abx Dialysis today Critical care per CCM  LOS: 2 days     Autumn Messing III 01/03/2021

## 2021-01-03 NOTE — Progress Notes (Signed)
eLink Physician-Brief Progress Note Patient Name: Theresa Russell DOB: 1961/08/04 MRN: [id number]   Date of Service  01/03/2021  HPI/Events of Note  AFIB with RVR - Ventricular rate = 156. Currently on Heparin [place]. Will hold on systemic anticoagulation for now as patient is going to OR tomorrow for further debridement of L thigh and perineal abscess. Now off of Norepinephrine IV infusion. Currently on Phenylephrine IV infusion for hemodynamic support.   eICU Interventions  Plan: 1. D/C Norepinephrine IV infusion. 2. Await BMP and Mg++ level.  3. Amiodarone IV load and infusion.      Intervention Category Major Interventions: Arrhythmia - evaluation and management  Zeenat Jeanbaptiste Eugene 01/03/2021, 11:01 PM

## 2021-01-03 NOTE — Progress Notes (Signed)
NAME:  Theresa Russell, MRN:  [id number], DOB:  06-Aug-1961, LOS: 2 ADMISSION DATE:  01/01/2021, CONSULTATION DATE:  5/10 REFERRING MD:  Jerelene Redden MD, CHIEF COMPLAINT:  Sepsis  History of Present Illness:  Theresa Russell is a 60 y.o. F who was transferred from [facility] with sepsis.  She has a pertinent past medical history of COPD [facility]), HTN, systolic HF, afib depression, DM II, ESRD (Tu,Th,S dialysis), known wounds to her lower extremities, groin, glutes, and coccyx due to complicated hospital stay from February to April of this year due to necrotizing fasciitis with Morganella, E. Coli, and MRSA and later complicated by klebsiella pneumonia and klebsiella bacteremia. She was treated with a myriad of antibiotics including zosyn, clindamycin, cefepime, Zyvox, doxycycline and meropenem and multiple debridements. She reportedly does not ambulate and was residing in a SNF.  Per chart review and discussion with her son Ulice Dash, Theresa Russell was transferred from her SNF to [facility] on 5/9 with concerns of altered mental status. She was found to be hypotensive and hypoxic on assesment. He last dialysis was Saturday where is was reported that 2L was pulled off. She was started on levophed, 3L of IVF was given, cultures were drawn, and she was started on Linezolid and Meropenem. A head CT was completed which was read by outside radiology as negative for acute process. A CT chest, ABD, and pelvis was obtained which was negative for PE but concerning for "crazy paving" with extensive infilitrate of both lungs, air fluid collection of the left anterior perineum and left medial thigh measuring 5.5 cm which was concerning for an abcess, pelvic dermoid/teratoma. She was found to have a leukocytosis of 13.1, lactate of 2.1 which increased to 2.5, and troponin I of 265. During transfer to [facility] on 5/10 she received 236m LR bolus and found to be normotensive, confused and off of pressors at time of  arrival.  PCCM was consulted for admission for sepsis.  Pertinent  Medical History  COPD (2LNC) HTN Depression DM II ESRD (Tu,Th,S dialysis), Afib with RVR Nec fasciitis s/p surgical debridement with MRSA, Morganella and E. Coli infection. Klebsiella bacteremia/pneumonia Chronic systolic heart failure Known wounds to her lower extremities, groin, glutes, and coccyx.   Significant Hospital Events: Including procedures, antibiotic start and stop dates in addition to other pertinent events   . [facility]  . 3/17 Drainage of inner thigh abcess . 4/27 RT IJ Temp cath removal, RT IJ  Tunneled HD cath insertion. . 5/9 Presented to [facility] with AMS. Found to be hypotensive. Head CT  negative for acute process. CT with/without chest, ABD, and pelvis was obtained which was negative for PE but concerning for "crazy paving" with extensive infilitrate of both lungs, air fluid collection of the left anterior perineum and left medial thigh measuring 5.5 cm which was concerning for an abcess, pelvic dermoid/teratoma. Started on Linezolid and Meropenem . [facility] performed I&D of left medial thigh wound with copious purulent discharge.  Antibiotics Linezolid 5/10>> Meropenem 5/11>>  Interim History / Subjective:  Patient able to participate more today. States she prefers to be called Theresa Russell Patient able to say name, DOB, location and month/year.  Patient tapered on levo to 1 mcg/min.  Objective   Blood pressure 100/75, pulse (!) 128, temperature (!) 97.2 F (36.2 C), temperature source Oral, resp. rate (!) 9, height _0  (1.676 m), weight 96.7 kg, SpO2 95 %. On 4LNC  Intake/Output Summary (Last 24 hours) at 01/03/2021 1055 Last data filed at 01/03/2021 0600 Gross per 24 hour  Intake 1455.95 ml  Output --  Net 1455.95 ml   Filed Weights   01/02/21 0500 01/03/21 0630  Weight: 95.5 kg 96.7 kg   Examination: General: in bed,  obese, chronically ill appearing, no acute distress HEENT: MMM, anicteric, trachea midline, tunneled HD cath R upper chest. Neuro: A&Ox3, oriented to person, place and month/year, RASS -1, PERRL 33m CV: Normal S1 and S2, Irregular rhythm, Afib on monitor, no m/r/g appreciated PULM:  CTAB, chest expansion symmetric GI: Soft, NT, BS throughout, indurated skin of lower abdomen Extremities: warm/dry, no pretibial edema Skin: Wound covered on coccyx and right glute, L groin wound covered with bandage, multiple wounds to left and right calf and dorsal foot. Indurated skin of bilateral upper medial thigh.  Labs/imaging that I havepersonally reviewed  (right click and "Reselect all SmartList Selections" daily)  WBC 16.2->15.4->14.1 Hgb 8.8->8.7->9.3 K 5.6->5.2->5.4 Troponin 1 183->195 Lactic acid 1.5->2.1 BCx NGTD  EXAM: PORTABLE CHEST 1 VIEW  COMPARISON:  11/26/2020  FINDINGS: Dialysis catheter tip at high right atrium. Midline trachea. Cardiomegaly accentuated by AP portable technique. Atherosclerosis in the transverse aorta. Mild right hemidiaphragm elevation. No pleural effusion or pneumothorax. Right greater than left lower lung predominant interstitial and airspace disease is relatively similar to on the prior, given differences in technique.  IMPRESSION: Cardiomegaly with interstitial and airspace disease which could represent infection or pulmonary edema.  Aortic Atherosclerosis (ICD10-I70.0).   Procedure: Limited Echo   Indications:  Hx of sepsis    Referring Phys: [id number] JANE ELLISON   IMPRESSIONS    1. Left ventricular ejection fraction, by estimation, is 55 to 60%. The  left ventricle has normal function. There is moderate asymmetric left  ventricular hypertrophy.  2. Right ventricular systolic function is moderately reduced. The right  ventricular size is normal.  3. The mitral valve is normal in structure. Mild mitral valve  regurgitation.   4. The aortic valve is tricuspid. Aortic valve regurgitation is not  visualized.  5. The inferior vena cava is dilated in size with <50% respiratory  variability, suggesting right atrial pressure of 15 mmHg.   FINDINGS  Left Ventricle: Left ventricular ejection fraction, by estimation, is 55  to 60%. The left ventricle has normal function. The left ventricular  internal cavity size was normal in size. There is moderate asymmetric left  ventricular hypertrophy.   Right Ventricle: The right ventricular size is normal. Right ventricular  systolic function is moderately reduced.   Left Atrium: Left atrial size was normal in size.   Right Atrium: Right atrial size was normal in size.   Pericardium: There is no evidence of pericardial effusion.   Mitral Valve: The mitral valve is normal in structure. Mild mitral valve  regurgitation.   Tricuspid Valve: The tricuspid valve is normal in structure. Tricuspid  valve regurgitation is mild.   Aortic Valve: The aortic valve is tricuspid. Aortic valve regurgitation is  not visualized.   Pulmonic Valve: The pulmonic valve was normal in structure. Pulmonic valve  regurgitation is mild.   Aorta: The aortic root and ascending aorta are structurally normal, with  no evidence of dilitation.   Venous: The inferior vena cava is dilated in size with less than 50%  respiratory variability, suggesting right atrial pressure of 15 mmHg.   IAS/Shunts: No atrial level shunt detected by color flow Doppler.   LEFT VENTRICLE  PLAX 2D  LVIDd:     3.70 cm  LVIDs:     2.20 cm  LV PW:     1.10 cm  LV IVS:    1.60 cm  LVOT diam:   1.80 cm  LVOT Area:   2.54 cm     RIGHT VENTRICLE     IVC  RV Basal diam: 3.10 cm IVC diam: 2.35 cm  RV Mid diam:  2.60 cm   LEFT ATRIUM       Index    RIGHT ATRIUM      Index  LA diam:    4.60 cm 2.25 cm/m RA Area:   15.40 cm  LA Vol (A2C):  63.9 ml 31.26 ml/m  RA Volume:  35.40 ml 17.32 ml/m  LA Vol (A4C):  48.1 ml 23.53 ml/m  LA Biplane Vol: 55.3 ml 27.06 ml/m    AORTA  Ao Root diam: 3.60 cm  Ao Asc diam: 3.10 cm     SHUNTS  Systemic Diam: 1.80 cm   Resolved Hospital Problem list     Assessment & Plan:  [name] is a 60 y.o. F w/ pmhx of COPD (2LNC), HTN, systolic HF, afib depression, DM II, ESRD (Tu,Th,S dialysis), known wounds to her lower extremities, groin, glutes, and coccyx due to complicated hospital stay from February to April of this year due to necrotizing fasciitis who was transferred from [facility] with septic shock.  Goals of care discussion with son; He wishes to continue to pursue any treatment including surgical options at this time.   Sepsis (Septic Shock at OSH) Questions the source, rt dialysis port in place, infiltrates in lungs, purulent L groin wound, questionable perineal abcess, and stage 4 wounds to coccyx and glute. Lactate increasing. WBC improving. BCx from [facility] NGTD and was not able to get culture of purulent sputum from I&D. Patient with BPs trending down to 80s/60s so giving 500cc bolus LR. - IVF bolus -Continue Meropenem and Linezolid. Narrow as cultures result -Continue stress dose steroids 71m hydrocortisone q6h. -Trend CBC/fever curve -Follow cultures  Acute Metabolic Encephalopathy Suspect secondary to sepsis. 5/9 head ct negative. Patient mentation improving on exam today compared to yesterday. -Goal MAP 65 as discussed -Delirium prevention precautions. Promote normal sleep/wake cycle. Avoid restraints as able. -Minimize sedating medications  Acute on Chronic Respiratory Failure with Hypoxia  COPD (2LNC at baseline). 5/9 CT negative for PE but concerning for "crazy paving" with extensive infilitrate of both lungs. On 4LNC. Will get CXR. -Continue Abx as discussed -Follow up sputum culture -Follow up CXR -Goal O2 sat 88-94. Wean O2 to goal -PRN albuterol nebulizer  Left  medial thigh/Perineum abscess s/p debridement 5/10 Per [facility] CT on 5/9. Surgery performed I&D 5/10 early AM with copious purulent discahrge and potential communication with left medial leg. History of necrotizing fasciitis. Wound cultures previously grew morganella, e.coli, MRSA.  -General surgery consulted; plan for debridement in OR tomorrow; NPO at midnight -Continue dressing changes -Antibiotics as discussed above -ID consulted; recommend continuing current antibiotic regimen  Wounds- BL lower extremities, L groin Stage 4 PI- RT glute and coccyx Per chart review was previously receiving dakins treatment to PI -Wound care consulted and following  Hx Afib Hx Systolic Heart Failure History of Afib, in afib, rate controlled, was previously on Diltiazem 338mq8h and metoprolol 25107mO BID. Troponin 183->195. Suspect demand ischemia. Echo with EF 55-60% -Continue telemetry -Will consider restarting home antiarrythmic and GDMT depending on HD status and BP improvement  HTN Hypotensive currently -Resume home meds when appropriate  Anemia of Chronic Disease (Baseline 8-9) -9.3 today -Transfuse pRBC if Hgb <7 -Trend CBC  ESRD (Tu,Th,S dialysis) Last dialysed on Saturday 5/7 with 2L pulled off.  -Nephrology consulted; plan for HD today -Trend CMP, MG, Phos  DM II -Continue SSI -Blood Glucose goal 140-180  Depression -Resume home meds when appropriate  Pelvic dermoid/teratoma Per [facility] CT on 5/9 -Follow up with PCP  Best practice (right click and "Reselect all SmartList Selections" daily)  Diet:  NPO Pain/Anxiety/Delirium protocol (if indicated): No VAP protocol (if indicated): Not indicated DVT prophylaxis: Subcutaneous Heparin GI prophylaxis: N/A Glucose control:  SSI Yes Central venous access:  N/A- RT IJ tunneled cath for HD Arterial line:  N/A Foley:  N/A Mobility:  bed rest  PT consulted: N/A Last date of multidisciplinary goals of care discussion: Updated  son, Ulice Dash, via phone 5/10; called 5/11 but not answering machine to leave VM. Code Status:  full code per [facility] 5/10 Disposition: ICU  Labs   CBC: Recent Labs  Lab 01/01/21 2039 01/02/21 0130 01/03/21 0607  WBC 16.2* 15.4* 14.1*  NEUTROABS 15.2*  --   --   HGB 8.8* 8.7* 9.3*  HCT 29.3* 29.3* 31.5*  MCV 94.5 94.5 94.6  PLT 158 140* 342    Basic Metabolic Panel: Recent Labs  Lab 01/01/21 2039 01/02/21 0130 01/03/21 0607  NA 133* 133* 133*  K 5.6* 5.2* 5.4*  CL 98 99 98  CO2 20* 21* 19*  GLUCOSE 162* 177* 160*  BUN 55* 56* 63*  CREATININE 4.18* 4.32* 4.65*  CALCIUM 7.9* 8.0* 7.8*  MG 1.9 1.9  --   PHOS 6.1* 6.5*  --    GFR: Estimated Creatinine Clearance: 15.7 mL/min (A) (by C-G formula based on SCr of 4.65 mg/dL (H)). Recent Labs  Lab 01/01/21 2039 01/02/21 0130 01/02/21 0853 01/03/21 0607  PROCALCITON 1.32  --   --   --   WBC 16.2* 15.4*  --  14.1*  LATICACIDVEN 1.5  --  2.1*  --     Liver Function Tests: Recent Labs  Lab 01/01/21 2039  AST 16  ALT 7  ALKPHOS 198*  BILITOT 0.7  PROT 6.2*  ALBUMIN 2.3*   No results for input(s): LIPASE, AMYLASE in the last 168 hours. No results for input(s): AMMONIA in the last 168 hours.  ABG    Component Value Date/Time   PHART 7.274 (L) 11/29/2020 0905   PCO2ART 51.5 (H) 11/29/2020 0905   PO2ART 68.5 (L) 11/29/2020 0905   HCO3 23.5 11/29/2020 0905   ACIDBASEDEF 2.6 (H) 11/29/2020 0905   O2SAT 92.7 11/29/2020 0905     Coagulation Profile: No results for input(s): INR, PROTIME in the last 168 hours.  Cardiac Enzymes: No results for input(s): CKTOTAL, CKMB, CKMBINDEX, TROPONINI in the last 168 hours.  HbA1C: Hgb A1c MFr Bld  Date/Time Value Ref Range Status  01/02/2021 01:30 AM 5.8 (H) 4.8 - 5.6 % Final    Comment:    (NOTE) Pre diabetes:          5.7%-6.4%  Diabetes:              >6.4%  Glycemic control for   <7.0% adults with diabetes   10/19/2020 03:58 AM 6.8 (H) 4.8 - 5.6 % Final    Comment:     (NOTE) Pre diabetes:          5.7%-6.4%  Diabetes:              >  6.4%  Glycemic control for   <7.0% adults with diabetes     CBG: Recent Labs  Lab 01/02/21 1542 01/02/21 1938 01/02/21 2322 01/03/21 0328 01/03/21 0759  GLUCAP 156* 174* 148* 147* 153*    Review of Systems:   Able to participate in parts of ROS. Denies chest pain, shortness of breath. Unable to obtain complete full ROS due to patient status and confusion.  Past Medical History:  Per Chart review: Unable to obtain from patient COPD [facility]) HTN Depression DM II ESRD (Tu,Th,S dialysis), Afib with RVR Chronic systolic heart failure Known wounds to her lower extremities, groin, glutes, and coccyx Nec fasciitis s/p surgical debridement with MRSA, Morganella and E. Coli infection. Klebsiella bacteremia/pneumonia  Surgical History:  Per chart review:Unable to obtain from patient Nec fascitis s/p surgical debridement   Social History:      Family History:  Her family history is not on file.   Allergies Allergies  Allergen Reactions  . Atorvastatin Other (See Comments)    Unknown reaction  . Canagliflozin Other (See Comments)    Unknown   . Levofloxacin Other (See Comments)    Unknown  . Lisinopril Other (See Comments)    Unknown  . Other     Orange dye   . Vancomycin Other (See Comments)    Unknown  . Hydrochlorothiazide Nausea Only     Home Medications  Prior to Admission medications   Not on File     Critical care time: [place], [place]

## 2021-01-03 NOTE — Progress Notes (Signed)
eLink Physician-Brief Progress Note Patient Name: Theresa Russell DOB: March 24, 1961 MRN: [id number]   Date of Service  01/03/2021  HPI/Events of Note  AFIB with RVR - Ventricular rate = 110 to 150. Apparently AFIB is chronic. Patient is currently on Norepinephrine IV infusion via PIV for hemodynamic support.   eICU Interventions  Plan: 1. Phenylephrine IV infusion via PIV. Titrate to MAP >= 65. 2. Wean Norepinephrine IV infusion off as tolerated. 3. BMP and Mg+ level STAT.     Intervention Category Major Interventions: Arrhythmia - evaluation and management  Ketty Bitton Cornelia Copa 01/03/2021, 9:42 PM

## 2021-01-03 NOTE — Progress Notes (Signed)
Subjective/Chief Complaint: More alert today and able to answer questions   Objective: Vital signs in last 24 hours: Temp:  [96.3 F (35.7 C)-97.6 F (36.4 C)] 97.2 F (36.2 C) (05/12 0700) Pulse Rate:  [98-128] 128 (05/12 0600) Resp:  [9-17] 9 (05/12 0600) BP: (82-103)/(60-84) 100/75 (05/12 0600) SpO2:  [93 %-98 %] 95 % (05/12 0600) Weight:  [96.7 kg] 96.7 kg (05/12 0630) Last BM Date: 01/02/21  Intake/Output from previous day: 05/11 0701 - 05/12 0700 In: 2163.2 [I.V.:441; IV Piggyback:1722.2] Out: -  Intake/Output this shift: No intake/output data recorded.  General appearance: arousable and answers questions Resp: clear to auscultation bilaterally Cardio: regular rate and rhythm GI: soft, multiple wounds on thigh and perirectal area draining pus  Lab Results:  Recent Labs    01/02/21 0130 01/03/21 0607  WBC 15.4* 14.1*  HGB 8.7* 9.3*  HCT 29.3* 31.5*  PLT 140* 179   BMET Recent Labs    01/02/21 0130 01/03/21 0607  NA 133* 133*  K 5.2* 5.4*  CL 99 98  CO2 21* 19*  GLUCOSE 177* 160*  BUN 56* 63*  CREATININE 4.32* 4.65*  CALCIUM 8.0* 7.8*   PT/INR No results for input(s): LABPROT, INR in the last 72 hours. ABG No results for input(s): PHART, HCO3 in the last 72 hours.  Invalid input(s): PCO2, PO2  Studies/Results: DG CHEST PORT 1 VIEW  Result Date: 01/02/2021 CLINICAL DATA:  Hypoxia. EXAM: PORTABLE CHEST 1 VIEW COMPARISON:  11/26/2020 FINDINGS: Dialysis catheter tip at high right atrium. Midline trachea. Cardiomegaly accentuated by AP portable technique. Atherosclerosis in the transverse aorta. Mild right hemidiaphragm elevation. No pleural effusion or pneumothorax. Right greater than left lower lung predominant interstitial and airspace disease is relatively similar to on the prior, given differences in technique. IMPRESSION: Cardiomegaly with interstitial and airspace disease which could represent infection or pulmonary edema. Aortic  Atherosclerosis (ICD10-I70.0). Electronically Signed   By: Abigail Miyamoto M.D.   On: 01/02/2021 14:03   ECHOCARDIOGRAM LIMITED  Result Date: 01/02/2021    ECHOCARDIOGRAM LIMITED REPORT   Patient Name:   Theresa Russell Date of Exam: 01/02/2021 Medical Rec #:  [id number]           Height:       66.0 in Accession #:    [id number]          Weight:       210.5 lb Date of Birth:  October 26, 1960          BSA:          2.044 m Patient Age:    60 years            BP:           90/70 mmHg Patient Gender: F                   HR:           105 bpm. Exam Location:  Inpatient Procedure: Limited Echo Indications:    Hx of sepsis  Referring Phys: [id number] [facility] JANE ELLISON IMPRESSIONS  1. Left ventricular ejection fraction, by estimation, is 55 to 60%. The left ventricle has normal function. There is moderate asymmetric left ventricular hypertrophy.  2. Right ventricular systolic function is moderately reduced. The right ventricular size is normal.  3. The mitral valve is normal in structure. Mild mitral valve regurgitation.  4. The aortic valve is tricuspid. Aortic valve regurgitation is not visualized.  5. The inferior vena cava is dilated  in size with <50% respiratory variability, suggesting right atrial pressure of 15 mmHg. FINDINGS  Left Ventricle: Left ventricular ejection fraction, by estimation, is 55 to 60%. The left ventricle has normal function. The left ventricular internal cavity size was normal in size. There is moderate asymmetric left ventricular hypertrophy. Right Ventricle: The right ventricular size is normal. Right ventricular systolic function is moderately reduced. Left Atrium: Left atrial size was normal in size. Right Atrium: Right atrial size was normal in size. Pericardium: There is no evidence of pericardial effusion. Mitral Valve: The mitral valve is normal in structure. Mild mitral valve regurgitation. Tricuspid Valve: The tricuspid valve is normal in structure. Tricuspid valve regurgitation is mild.  Aortic Valve: The aortic valve is tricuspid. Aortic valve regurgitation is not visualized. Pulmonic Valve: The pulmonic valve was normal in structure. Pulmonic valve regurgitation is mild. Aorta: The aortic root and ascending aorta are structurally normal, with no evidence of dilitation. Venous: The inferior vena cava is dilated in size with less than 50% respiratory variability, suggesting right atrial pressure of 15 mmHg. IAS/Shunts: No atrial level shunt detected by color flow Doppler. LEFT VENTRICLE PLAX 2D LVIDd:         3.70 cm LVIDs:         2.20 cm LV PW:         1.10 cm LV IVS:        1.60 cm LVOT diam:     1.80 cm LVOT Area:     2.54 cm  RIGHT VENTRICLE          IVC RV Basal diam:  3.10 cm  IVC diam: 2.35 cm RV Mid diam:    2.60 cm LEFT ATRIUM             Index       RIGHT ATRIUM           Index LA diam:        4.60 cm 2.25 cm/m  RA Area:     15.40 cm LA Vol (A2C):   63.9 ml 31.26 ml/m RA Volume:   35.40 ml  17.32 ml/m LA Vol (A4C):   48.1 ml 23.53 ml/m LA Biplane Vol: 55.3 ml 27.06 ml/m   AORTA Ao Root diam: 3.60 cm Ao Asc diam:  3.10 cm  SHUNTS Systemic Diam: 1.80 cm Dorris Carnes MD Electronically signed by Dorris Carnes MD Signature Date/Time: 01/02/2021/5:09:31 PM    Final     Anti-infectives: Anti-infectives (From admission, onward)   Start     Dose/Rate Route Frequency Ordered Stop   01/02/21 1500  meropenem (MERREM) 500 mg in sodium chloride 0.9 % 100 mL IVPB        500 mg 200 mL/hr over 30 Minutes Intravenous Every 24 hours 01/01/21 2010     01/01/21 2200  linezolid (ZYVOX) IVPB 600 mg        600 mg 300 mL/hr over 60 Minutes Intravenous Every 12 hours 01/01/21 2010        Assessment/Plan: s/p * No surgery found * will likely need I and D in OR. I have discussed this with pt and she is agreeable. I have tried to contact son but no answer. She will need to be dialyzed today. Will plan for debridement in OR tomorrow Continue abx Dialysis today Critical care per CCM  LOS: 2 days     Autumn Messing III 01/03/2021

## 2021-01-04 ENCOUNTER — Inpatient Hospital Stay (HOSPITAL_COMMUNITY): Payer: Medicare Other | Admitting: Certified Registered Nurse Anesthetist

## 2021-01-04 ENCOUNTER — Encounter (HOSPITAL_COMMUNITY)
Admission: AD | Disposition: A | Discharge status: Nursing Home (Includes ICF) | Payer: Self-pay | Attending: Pulmonary Disease

## 2021-01-04 ENCOUNTER — Encounter (HOSPITAL_COMMUNITY): Payer: Self-pay | Admitting: Pulmonary Disease

## 2021-01-04 DIAGNOSIS — I4891 Unspecified atrial fibrillation: Secondary | ICD-10-CM

## 2021-01-04 DIAGNOSIS — N186 End stage renal disease: Secondary | ICD-10-CM | POA: Diagnosis not present

## 2021-01-04 DIAGNOSIS — A419 Sepsis, unspecified organism: Secondary | ICD-10-CM | POA: Diagnosis not present

## 2021-01-04 DIAGNOSIS — L02419 Cutaneous abscess of limb, unspecified: Secondary | ICD-10-CM | POA: Diagnosis not present

## 2021-01-04 DIAGNOSIS — R6521 Severe sepsis with septic shock: Secondary | ICD-10-CM | POA: Diagnosis not present

## 2021-01-04 HISTORY — PX: INCISION AND DRAINAGE ABSCESS: SHX5864

## 2021-01-04 LAB — BASIC METABOLIC PANEL
Anion gap: 10 (ref 5–15)
BUN: 27 mg/dL — ABNORMAL HIGH (ref 6–20)
CO2: 26 mmol/L (ref 22–32)
Calcium: 7.7 mg/dL — ABNORMAL LOW (ref 8.9–10.3)
Chloride: 98 mmol/L (ref 98–111)
Creatinine, Ser: 2.66 mg/dL — ABNORMAL HIGH (ref 0.44–1.00)
GFR, Estimated: 20 mL/min — ABNORMAL LOW (ref >60)
Glucose, Bld: 110 mg/dL — ABNORMAL HIGH (ref 70–99)
Potassium: 3.8 mmol/L (ref 3.5–5.1)
Sodium: 134 mmol/L — ABNORMAL LOW (ref 135–145)

## 2021-01-04 LAB — TYPE AND SCREEN
ABO/RH(D): B POS
Antibody Screen: NEGATIVE

## 2021-01-04 LAB — GLUCOSE, CAPILLARY
Glucose-Capillary: 102 mg/dL — ABNORMAL HIGH (ref 70–99)
Glucose-Capillary: 114 mg/dL — ABNORMAL HIGH (ref 70–99)
Glucose-Capillary: 145 mg/dL — ABNORMAL HIGH (ref 70–99)
Glucose-Capillary: 146 mg/dL — ABNORMAL HIGH (ref 70–99)
Glucose-Capillary: 181 mg/dL — ABNORMAL HIGH (ref 70–99)
Glucose-Capillary: 266 mg/dL — ABNORMAL HIGH (ref 70–99)

## 2021-01-04 LAB — CBC
HCT: 28.1 % — ABNORMAL LOW (ref 36.0–46.0)
Hemoglobin: 8.4 g/dL — ABNORMAL LOW (ref 12.0–15.0)
MCH: 27.5 pg (ref 26.0–34.0)
MCHC: 29.9 g/dL — ABNORMAL LOW (ref 30.0–36.0)
MCV: 92.1 fL (ref 80.0–100.0)
Platelets: 123 10*3/uL — ABNORMAL LOW (ref 150–400)
RBC: 3.05 MIL/uL — ABNORMAL LOW (ref 3.87–5.11)
RDW: 16.9 % — ABNORMAL HIGH (ref 11.5–15.5)
WBC: 10.7 10*3/uL — ABNORMAL HIGH (ref 4.0–10.5)
nRBC: 1.7 % — ABNORMAL HIGH (ref 0.0–0.2)

## 2021-01-04 LAB — IRON AND TIBC
Iron: 13 ug/dL — ABNORMAL LOW (ref 28–170)
Saturation Ratios: UNDETERMINED % (ref 10.4–31.8)
TIBC: UNDETERMINED ug/dL (ref 250–450)
UIBC: UNDETERMINED ug/dL

## 2021-01-04 LAB — FERRITIN: Ferritin: 420 ng/mL — ABNORMAL HIGH (ref 11–307)

## 2021-01-04 SURGERY — INCISION AND DRAINAGE, ABSCESS
Anesthesia: General

## 2021-01-04 MED ORDER — RENA-VITE PO TABS
1.0000 | ORAL_TABLET | Freq: Every day | ORAL | Status: DC
  Administered 2021-01-04 – 2021-01-08 (×5): 1
  Filled 2021-01-04 (×5): qty 1

## 2021-01-04 MED ORDER — ROCURONIUM BROMIDE 10 MG/ML (PF) SYRINGE
PREFILLED_SYRINGE | INTRAVENOUS | Status: DC | PRN
  Administered 2021-01-04: 10 mg via INTRAVENOUS
  Administered 2021-01-04: 40 mg via INTRAVENOUS

## 2021-01-04 MED ORDER — MIDAZOLAM HCL 2 MG/2ML IJ SOLN
INTRAMUSCULAR | Status: AC
  Filled 2021-01-04: qty 2

## 2021-01-04 MED ORDER — PROPOFOL 10 MG/ML IV BOLUS
INTRAVENOUS | Status: DC | PRN
  Administered 2021-01-04: 60 mg via INTRAVENOUS

## 2021-01-04 MED ORDER — LIDOCAINE 2% (20 MG/ML) 5 ML SYRINGE
INTRAMUSCULAR | Status: DC | PRN
  Administered 2021-01-04: 60 mg via INTRAVENOUS

## 2021-01-04 MED ORDER — PROPOFOL 10 MG/ML IV BOLUS
INTRAVENOUS | Status: AC
  Filled 2021-01-04: qty 20

## 2021-01-04 MED ORDER — FENTANYL CITRATE (PF) 100 MCG/2ML IJ SOLN
25.0000 ug | INTRAMUSCULAR | Status: DC | PRN
  Administered 2021-01-04 – 2021-01-05 (×3): 25 ug via INTRAVENOUS
  Filled 2021-01-04 (×3): qty 2

## 2021-01-04 MED ORDER — ACETAMINOPHEN 160 MG/5ML PO SOLN
650.0000 mg | Freq: Four times a day (QID) | ORAL | Status: DC | PRN
  Administered 2021-01-05 – 2021-01-08 (×4): 650 mg
  Filled 2021-01-04 (×4): qty 20.3

## 2021-01-04 MED ORDER — ACETAMINOPHEN 500 MG PO TABS: 1000.0000 mg | ORAL_TABLET | Freq: Once | ORAL | Status: DC

## 2021-01-04 MED ORDER — SUGAMMADEX SODIUM 200 MG/2ML IV SOLN
INTRAVENOUS | Status: DC | PRN
  Administered 2021-01-04 (×2): 100 mg via INTRAVENOUS
  Administered 2021-01-04: 200 mg via INTRAVENOUS

## 2021-01-04 MED ORDER — STERILE WATER FOR IRRIGATION IR SOLN
Status: DC | PRN
  Administered 2021-01-04: 1000 mL

## 2021-01-04 MED ORDER — ONDANSETRON HCL 4 MG/2ML IJ SOLN
INTRAMUSCULAR | Status: DC | PRN
  Administered 2021-01-04: 4 mg via INTRAVENOUS

## 2021-01-04 MED ORDER — 0.9 % SODIUM CHLORIDE (POUR BTL) OPTIME
TOPICAL | Status: DC | PRN
  Administered 2021-01-04: 1000 mL

## 2021-01-04 MED ORDER — PROSOURCE TF PO LIQD
90.0000 mL | Freq: Three times a day (TID) | ORAL | Status: DC
  Administered 2021-01-04 – 2021-01-07 (×9): 90 mL
  Filled 2021-01-04 (×9): qty 90

## 2021-01-04 MED ORDER — ORAL CARE MOUTH RINSE
15.0000 mL | OROMUCOSAL | Status: DC
  Administered 2021-01-04 – 2021-01-06 (×17): 15 mL via OROMUCOSAL

## 2021-01-04 MED ORDER — PHENYLEPHRINE 40 MCG/ML (10ML) SYRINGE FOR IV PUSH (FOR BLOOD PRESSURE SUPPORT)
PREFILLED_SYRINGE | INTRAVENOUS | Status: DC | PRN
  Administered 2021-01-04: 160 ug via INTRAVENOUS

## 2021-01-04 MED ORDER — CHLORHEXIDINE GLUCONATE 0.12% ORAL RINSE (MEDLINE KIT)
15.0000 mL | Freq: Two times a day (BID) | OROMUCOSAL | Status: DC
  Administered 2021-01-04 – 2021-01-09 (×10): 15 mL via OROMUCOSAL

## 2021-01-04 MED ORDER — SODIUM CHLORIDE 0.9 % IV SOLN: INTRAVENOUS | Status: DC | PRN

## 2021-01-04 MED ORDER — PHENYLEPHRINE HCL-NACL 10-0.9 MG/250ML-% IV SOLN
INTRAVENOUS | Status: DC | PRN
  Administered 2021-01-04: 25 ug/min via INTRAVENOUS

## 2021-01-04 MED ORDER — SUCCINYLCHOLINE CHLORIDE 200 MG/10ML IV SOSY
PREFILLED_SYRINGE | INTRAVENOUS | Status: DC | PRN
  Administered 2021-01-04: 200 mg via INTRAVENOUS

## 2021-01-04 MED ORDER — CHLORHEXIDINE GLUCONATE CLOTH 2 % EX PADS: 6.0000 | MEDICATED_PAD | Freq: Every day | CUTANEOUS | Status: DC

## 2021-01-04 MED ORDER — FENTANYL CITRATE (PF) 250 MCG/5ML IJ SOLN
INTRAMUSCULAR | Status: AC
  Filled 2021-01-04: qty 5

## 2021-01-04 MED ORDER — HYDROCORTISONE NA SUCCINATE PF 100 MG IJ SOLR
50.0000 mg | Freq: Three times a day (TID) | INTRAMUSCULAR | Status: DC
  Administered 2021-01-04 – 2021-01-05 (×3): 50 mg via INTRAVENOUS
  Filled 2021-01-04 (×3): qty 2

## 2021-01-04 MED ORDER — VITAL 1.5 CAL PO LIQD
1000.0000 mL | ORAL | Status: DC
  Administered 2021-01-04 – 2021-01-06 (×3): 1000 mL
  Filled 2021-01-04: qty 1000

## 2021-01-04 MED ORDER — NOREPINEPHRINE 4 MG/250ML-% IV SOLN
INTRAVENOUS | Status: AC
  Filled 2021-01-04: qty 250

## 2021-01-04 MED ORDER — DEXMEDETOMIDINE HCL IN NACL 400 MCG/100ML IV SOLN
0.4000 ug/kg/h | INTRAVENOUS | Status: DC
  Administered 2021-01-04: 0.8 ug/kg/h via INTRAVENOUS
  Administered 2021-01-04: 0.4 ug/kg/h via INTRAVENOUS
  Administered 2021-01-05 (×2): 0.8 ug/kg/h via INTRAVENOUS
  Administered 2021-01-05: 1.2 ug/kg/h via INTRAVENOUS
  Administered 2021-01-05: 0.5 ug/kg/h via INTRAVENOUS
  Administered 2021-01-05 – 2021-01-06 (×4): 1 ug/kg/h via INTRAVENOUS
  Filled 2021-01-04 (×11): qty 100

## 2021-01-04 MED ORDER — ESMOLOL HCL 100 MG/10ML IV SOLN
INTRAVENOUS | Status: DC | PRN
  Administered 2021-01-04: 30 mg via INTRAVENOUS

## 2021-01-04 SURGICAL SUPPLY — 27 items
BNDG CONFORM 3 STRL LF (GAUZE/BANDAGES/DRESSINGS) ×1 IMPLANT
BNDG GAUZE ELAST 4 BULKY (GAUZE/BANDAGES/DRESSINGS) ×2 IMPLANT
CANISTER SUCT 3000ML PPV (MISCELLANEOUS) ×2 IMPLANT
COVER SURGICAL LIGHT HANDLE (MISCELLANEOUS) ×2 IMPLANT
COVER WAND RF STERILE (DRAPES) ×2 IMPLANT
DRAPE LAPAROSCOPIC ABDOMINAL (DRAPES) IMPLANT
DRAPE LAPAROTOMY 100X72 PEDS (DRAPES) IMPLANT
DRSG PAD ABDOMINAL 8X10 ST (GAUZE/BANDAGES/DRESSINGS) ×1 IMPLANT
ELECT REM PT RETURN 9FT ADLT (ELECTROSURGICAL) ×2
ELECTRODE REM PT RTRN 9FT ADLT (ELECTROSURGICAL) ×1 IMPLANT
GAUZE SPONGE 4X4 12PLY STRL (GAUZE/BANDAGES/DRESSINGS) IMPLANT
GLOVE BIO SURGEON STRL SZ7.5 (GLOVE) ×2 IMPLANT
GOWN STRL REUS W/ TWL LRG LVL3 (GOWN DISPOSABLE) ×2 IMPLANT
GOWN STRL REUS W/TWL LRG LVL3 (GOWN DISPOSABLE) ×2
KIT BASIN OR (CUSTOM PROCEDURE TRAY) ×2 IMPLANT
KIT TURNOVER KIT B (KITS) ×2 IMPLANT
NDL HYPO 25GX1X1/2 BEV (NEEDLE) IMPLANT
NEEDLE HYPO 25GX1X1/2 BEV (NEEDLE) IMPLANT
NS IRRIG 1000ML POUR BTL (IV SOLUTION) ×2 IMPLANT
PACK GENERAL/GYN (CUSTOM PROCEDURE TRAY) ×2 IMPLANT
PAD ARMBOARD 7.5X6 YLW CONV (MISCELLANEOUS) ×2 IMPLANT
PENCIL SMOKE EVACUATOR (MISCELLANEOUS) ×2 IMPLANT
SWAB COLLECTION DEVICE MRSA (MISCELLANEOUS) IMPLANT
SWAB CULTURE ESWAB REG 1ML (MISCELLANEOUS) IMPLANT
SYR CONTROL 10ML LL (SYRINGE) IMPLANT
TOWEL GREEN STERILE (TOWEL DISPOSABLE) ×2 IMPLANT
TOWEL GREEN STERILE FF (TOWEL DISPOSABLE) ×2 IMPLANT

## 2021-01-04 NOTE — TOC Initial Note (Addendum)
Transition of Care [facility]) - Initial/Assessment Note    Patient Details  Name: Theresa Russell MRN: [id number] Date of Birth: 1961-01-08  [facility] CM/SW Contact:    Verdell Carmine, RN Phone Number: 01/04/2021, 8:05 AM  Clinical Narrative:                 Patient admitted from SNF to [facility], tansferred here due to sepsis. She is is a dialysis patient, and has wounds that were debreided yesterday. Wound care following. This is likely cause of sepsis.  She has a history of COPD and uses 2LPM N/C normally.Currently on neo and amio for atrial fib. Plan would be return to SNF with wound care. Would recommend palliative team consult for goals of care discussion. CSW will follow   Expected Discharge Plan: [place] Barriers to Discharge: Continued Medical Work up   Patient Goals and CMS Choice        Expected Discharge Plan and Services Expected Discharge Plan: [place] In-house Referral: Clinical Social Work   Post Acute Care Choice: [place] Living arrangements for the past 2 months: [place]                                      Prior Living Arrangements/Services Living arrangements for the past 2 months: [place] Lives with:: Facility Resident Patient language and need for interpreter reviewed:: Yes        Need for Family Participation in Patient Care: Yes (Comment) Care giver support system in place?: Yes (comment)   Criminal Activity/Legal Involvement Pertinent to Current Situation/Hospitalization: No - Comment as needed  Activities of Daily Living      Permission Sought/Granted                  Emotional Assessment       Orientation: : Fluctuating Orientation (Suspected and/or reported Sundowners) Alcohol / Substance Use: Not Applicable Psych Involvement: No (comment)  Admission diagnosis:  Sepsis ([place]) [A41.9] Patient Active Problem List    Diagnosis Date Noted  . Thigh abscess 01/03/2021  . ESRD (end stage renal disease) ([place]) 01/03/2021  . Septic shock ([place]) 01/01/2021  . Acute respiratory failure with hypoxia ([place])   . Acute metabolic encephalopathy    PCP:  Pcp, No Pharmacy:   [place] [address] Phone: [phone] Fax: [phone]     Social Determinants of Health (SDOH) Interventions    Readmission Risk Interventions No flowsheet data found.

## 2021-01-04 NOTE — Op Note (Signed)
01/01/2021 - 01/04/2021  12:59 PM  PATIENT:  Theresa Russell  60 y.o. female  PRE-OPERATIVE DIAGNOSIS:  PERIRECTAL SOFT TISSUE INFECTION  POST-OPERATIVE DIAGNOSIS:  PERIRECTAL SOFT TISSUE INFECTION  PROCEDURE:  Procedure(s): INCISION AND DRAINAGE OF PERIRECTAL ABSCESS (N/A)  SURGEON:  Surgeon(s) and Role:    * Jovita Kussmaul, MD - Primary  PHYSICIAN ASSISTANT:   ASSISTANTS: none   ANESTHESIA:   general  EBL:  minimal   BLOOD ADMINISTERED:none  DRAINS: none   LOCAL MEDICATIONS USED:  NONE  SPECIMEN:  No Specimen  DISPOSITION OF SPECIMEN:  N/A  COUNTS:  YES  TOURNIQUET:  * No tourniquets in log *  DICTATION: .Dragon Dictation   After informed consent was obtained the patient was brought to the operating room and placed in the supine position on the operating table.  After adequate induction of general anesthesia the patient was moved in the lithotomy position and all pressure points were padded.  The perirectal and perineal area and onto the left thigh was all prepped with Betadine and draped in usual sterile manner.  The wounds were probed and were actually fairly clean.  It looked as though the wounds were not packed deeply enough and some necrotic tissue had built up at the base of the wounds.  The opening on the left thigh was extended proximally several inches so that the entire wound could be opened and packed.  Cultures were taken.  Some necrotic tissue was removed bluntly.  Hemostasis was achieved using the Bovie electrocautery.  The wound was then packed with a moistened Kerlix gauze.  Attention was then turned to the 2 wounds in the perineal and perirectal space.  These wounds connected and it looked as though again there was some necrotic tissue along the tunnel that connected the 2 but the 2 wounds themselves were clean.  The perirectal wound was opened anteriorly a short distance to make it easier to pack the space.  Some necrotic tissue was removed bluntly.  A  Curlex was then used to span the tunnel between the 2 wounds and then used to pack both of these connecting wounds.  Sterile dressings were then applied.  The patient tolerated the procedure well.  All needle sponge and instrument counts were correct.  The wounds on the right perirectal space and thigh all seem to be superficial with no fluctuance or drainage.  The patient was then awakened and taken to recovery in guarded condition.  The patient will go back to the ICU for further resuscitation measures.  PLAN OF CARE: Admit to inpatient   PATIENT DISPOSITION:  PACU - guarded condition.   Delay start of Pharmacological VTE agent (>24hrs) due to surgical blood loss or risk of bleeding: no

## 2021-01-04 NOTE — Progress Notes (Signed)
ABG attempted by RT. MD okay with venous gas.

## 2021-01-04 NOTE — Procedures (Signed)
Cortrak  Person Inserting Tube:  Yarielys Beed E, RD Tube Type:  Cortrak - 43 inches Tube Location:  Left nare Initial Placement:  Stomach Secured by: Bridle Technique Used to Measure Tube Placement:  Documented cm marking at nare/ corner of mouth Cortrak Secured At:  70 cm   Cortrak Tube Team Note:  Consult received to place a Cortrak feeding tube.   No x-ray is required. RN may begin using tube.   If the tube becomes dislodged please keep the tube and contact the Cortrak team at [email or web address] (password [email or web address]) for replacement.  If after hours and replacement cannot be delayed, place a NG tube and confirm placement with an abdominal x-ray.    Shawnetta Lein, MS, RD, LDN RD pager number and weekend/on-call pager number located in Amion.    

## 2021-01-04 NOTE — Interval H&P Note (Signed)
History and Physical Interval Note:  01/04/2021 11:21 AM  Theresa Russell  has presented today for surgery, with the diagnosis of PERIRECTAL SOFT TISSUE INFECTION.  The various methods of treatment have been discussed with the patient and family. After consideration of risks, benefits and other options for treatment, the patient has consented to  Procedure(s): INCISION AND DRAINAGE OF PERIRECTAL ABSCESS (N/A) as a surgical intervention.  The patient's history has been reviewed, patient examined, no change in status, stable for surgery.  I have reviewed the patient's chart and labs.  Questions were answered to the patient's satisfaction.     Autumn Messing III

## 2021-01-04 NOTE — Anesthesia Preprocedure Evaluation (Addendum)
Anesthesia Evaluation  Patient identified by MRN, date of birth, ID band Patient awake    Reviewed: Allergy & Precautions, NPO status , Patient's Chart, lab work & pertinent test results, reviewed documented beta blocker date and time   Airway Mallampati: III  TM Distance: >3 FB Neck ROM: Full    Dental  (+) Chipped, Dental Advisory Given,    Pulmonary COPD (2L [place]),  oxygen dependent,    Pulmonary exam normal breath sounds clear to auscultation       Cardiovascular hypertension, Pt. on medications and Pt. on home beta blockers +CHF  Normal cardiovascular exam+ dysrhythmias (Afib with RVR overnight on amio gtt) Atrial Fibrillation  Rhythm:Irregular Rate:Normal  TTE 2022 1. Left ventricular ejection fraction, by estimation, is 55 to 60%. The left ventricle has normal function. There is moderate asymmetric left ventricular hypertrophy.  2. Right ventricular systolic function is moderately reduced. The right ventricular size is normal.  3. The mitral valve is normal in structure. Mild mitral valve  regurgitation.  4. The aortic valve is tricuspid. Aortic valve regurgitation is not visualized.  5. The inferior vena cava is dilated in size with <50% respiratory variability, suggesting right atrial pressure of 15 mmHg.    Neuro/Psych Anxiety negative neurological ROS  negative psych ROS   GI/Hepatic Neg liver ROS, GERD  Medicated and Controlled,  Endo/Other  diabetes, Type 2, Insulin Dependent  Renal/GU ESRF and DialysisRenal disease (diaylsis TThS, last HD yesterday)  negative genitourinary   Musculoskeletal negative musculoskeletal ROS (+)   Abdominal   Peds  Hematology  (+) Blood dyscrasia (Hgb 8.4, plt 123), anemia ,   Anesthesia Other Findings Admitted on 01/01/21 from OSH in septic shock with multiple purulent wounds. On norepi overnight now on neo gtt.  Reproductive/Obstetrics                          Anesthesia Physical Anesthesia Plan  ASA: IV  Anesthesia Plan: General   Post-op Pain Management:    Induction: Intravenous  PONV Risk Score and Plan: 3 and Ondansetron and Treatment may vary due to age or medical condition  Airway Management Planned: Oral ETT and Video Laryngoscope Planned  Additional Equipment:   Intra-op Plan:   Post-operative Plan: Extubation in OR and Possible Post-op intubation/ventilation  Informed Consent: I have reviewed the patients History and Physical, chart, labs and discussed the procedure including the risks, benefits and alternatives for the proposed anesthesia with the patient or authorized representative who has indicated his/her understanding and acceptance.     Dental advisory given  Plan Discussed with: CRNA  Anesthesia Plan Comments:        Anesthesia Quick Evaluation

## 2021-01-04 NOTE — Progress Notes (Signed)
Pt has been in atrial fib for 3 days with rates 90's-120's. After initiating HD, pt HR increased to 140s-160s sustained. Notified ELink and obtained order to change levo to neo, which did not improve the HR. Notified ELink again and obtained order for BMP and Mg levels, which were not significantly out of range. Order obtained for amiodarone gtt and bolus. Amiodarone initiated as ordered. The moment that HD ended, pt resumed previous rates in 100s-120s, still in atrial fib. Other VSS at this time.

## 2021-01-04 NOTE — Progress Notes (Signed)
NAME:  Theresa Russell, MRN:  [id number], DOB:  09/01/1960, LOS: 3 ADMISSION DATE:  01/01/2021, CONSULTATION DATE:  5/10 REFERRING MD:  Jerelene Redden MD, CHIEF COMPLAINT:  Sepsis  History of Present Illness:  Theresa Russell is a 60 y.o. F who was transferred from [facility] with sepsis.  She has a pertinent past medical history of COPD [facility]), HTN, systolic HF, afib depression, DM II, ESRD (Tu,Th,S dialysis), known wounds to her lower extremities, groin, glutes, and coccyx due to complicated hospital stay from February to April of this year due to necrotizing fasciitis with Morganella, E. Coli, and MRSA and later complicated by klebsiella pneumonia and klebsiella bacteremia. She was treated with a myriad of antibiotics including zosyn, clindamycin, cefepime, Zyvox, doxycycline and meropenem and multiple debridements. She reportedly does not ambulate and was residing in a SNF.  Per chart review and discussion with her son Theresa Russell, Theresa Russell was transferred from her SNF to [facility] on 5/9 with concerns of altered mental status. She was found to be hypotensive and hypoxic on assesment. He last dialysis was Saturday where is was reported that 2L was pulled off. She was started on levophed, 3L of IVF was given, cultures were drawn, and she was started on Linezolid and Meropenem. A head CT was completed which was read by outside radiology as negative for acute process. A CT chest, ABD, and pelvis was obtained which was negative for PE but concerning for "crazy paving" with extensive infilitrate of both lungs, air fluid collection of the left anterior perineum and left medial thigh measuring 5.5 cm which was concerning for an abcess, pelvic dermoid/teratoma. She was found to have a leukocytosis of 13.1, lactate of 2.1 which increased to 2.5, and troponin I of 265. During transfer to [facility] on 5/10 she received 258m LR bolus and found to be normotensive, confused and off of pressors at time of  arrival.  PCCM was consulted for admission for sepsis.  Pertinent  Medical History  COPD (2LNC) HTN Depression DM II ESRD (Tu,Th,S dialysis), Afib with RVR Nec fasciitis s/p surgical debridement with MRSA, Morganella and E. Coli infection. Klebsiella bacteremia/pneumonia Chronic systolic heart failure Known wounds to her lower extremities, groin, glutes, and coccyx.   Significant Hospital Events: Including procedures, antibiotic start and stop dates in addition to other pertinent events   . [facility]  . 3/17 Drainage of inner thigh abcess . 4/27 RT IJ Temp cath removal, RT IJ  Tunneled HD cath insertion. . 5/9 Presented to [place] [facility] with AMS. Found to be hypotensive. Head CT  negative for acute process. CT with/without chest, ABD, and pelvis was obtained which was negative for PE but concerning for "crazy paving" with extensive infilitrate of both lungs, air fluid collection of the left anterior perineum and left medial thigh measuring 5.5 cm which was concerning for an abcess, pelvic dermoid/teratoma. Started on Linezolid and Meropenem . [facility] of left medial thigh wound with copious purulent discharge. . 5/12 Episode of Afib with RVR to 150s during dialysis, switched from levophed to phenylephrine and amio started. . 5/13 OR surgery for debridement  Antibiotics Linezolid 5/10>> Meropenem 5/11>>  Interim History / Subjective:  Patient able to participate more today. Patient able to say name,, location and month/year.  Per nursing note: "After initiating HD, pt HR increased to 140s-160s sustained. Notified ELink and obtained order to change levo to neo, which did not improve the HR.  Notified ELink again and obtained order for BMP and Mg levels, which were not significantly out of range. Order obtained for amiodarone gtt and bolus. Amiodarone initiated as ordered. The moment that HD ended, pt resumed previous rates  in 100s-120s, still in atrial fib." Objective   Blood pressure 92/72, pulse (!) 109, temperature (!) 97.5 F (36.4 C), temperature source Axillary, resp. rate 10, height 5' 6"  (1.676 m), weight 98.2 kg, SpO2 94 %. On 4LNC        Intake/Output Summary (Last 24 hours) at 01/04/2021 1110 Last data filed at 01/04/2021 0600 Gross per 24 hour  Intake 1308.19 ml  Output 0 ml  Net 1308.19 ml   Filed Weights   01/02/21 0500 01/03/21 0630 01/04/21 0500  Weight: 95.5 kg 96.7 kg 98.2 kg   Examination: General: in bed, obese, chronically ill appearing, no acute distress HEENT: MMM, anicteric, trachea midline, tunneled HD cath R upper chest. Neuro: A&Ox3, oriented to person, place and month/year, RASS -1, PERRL 64m CV: Normal S1 and S2, Irregular rhythm, Afib on monitor, no m/r/g appreciated PULM:  CTAB, chest expansion symmetric GI: Soft, NT, BS throughout, indurated skin of lower abdomen Extremities: warm/dry, no pretibial edema Skin: Wound covered on coccyx and right glute, L groin wound covered with bandage, multiple wounds to left and right calf and dorsal foot. Indurated skin of bilateral upper medial thigh.  Labs/imaging that I havepersonally reviewed  (right click and "Reselect all SmartList Selections" daily)  WBC 16.2->15.4->14.1->10.7 Hgb 8.8->8.7->9.3 K 5.6->5.2->5.4->3.8 BCx NGTD  Resolved Hospital Problem list     Assessment & Plan:  Ms. Theresa PROTZMANis a 60y.o. F w/ pmhx of COPD (2LNC), HTN, systolic HF, afib depression, DM II, ESRD (Tu,Th,S dialysis), known wounds to her lower extremities, groin, glutes, and coccyx due to complicated hospital stay from February to April of this year due to necrotizing fasciitis [facility].  Goals of care discussion with son; He wishes to continue to pursue any treatment including surgical options at this time.   Sepsis (Septic Shock at OSH) Questions the source, rt dialysis port in place,  infiltrates in lungs, purulent L groin wound, questionable perineal abcess, and stage 4 wounds to coccyx and glute. Lactate increasing. WBC improving. [facility] was not able to get culture of purulent sputum from I&D. Patient with BPs trending down to 80s/60s so giving 500cc bolus LR. -Continue Meropenem and Linezolid. Narrow as cultures result -Taper stress dose steroids to 575mhydrocortisone q8h -Trend CBC/fever curve -Follow cultures -Phenylephrine off now. Goal MAP 65  Acute Metabolic Encephalopathy Suspect secondary to sepsis. 5/9 head ct negative. Patient mentation improving -Goal MAP 65 as discussed -Delirium prevention precautions. Promote normal sleep/wake cycle. Avoid restraints as able. -Minimize sedating medications  Acute on Chronic Respiratory Failure with Hypoxia  COPD (2LNC at baseline). 5/9 CT negative for PE but concerning for "crazy paving" with extensive infilitrate of both lungs. On 4LNC. Will get CXR. -Continue Abx as discussed -Follow up sputum culture -Follow up CXR -Goal O2 sat 88-94. Wean O2 to goal -PRN albuterol nebulizer  Left medial thigh/Perineum abscess s/p debridement 5/10 Per [facility] [facility] CT on 5/9. Surgery performed I&D 5/10 early AM with copious purulent discahrge and potential communication with left medial leg. History of necrotizing fasciitis. Wound cultures previously grew morganella, e.coli, MRSA.  -General surgery consulted; plan for debridement in OR today; narrow abx after cultures from surgery -Continue dressing changes -Antibiotics as  discussed above -ID consulted; recommend continuing current antibiotic regimen  Wounds- BL lower extremities, L groin Stage 4 PI- RT glute and coccyx Per chart review was previously receiving dakins treatment to PI -Wound care consulted and following  Hx Afib Hx Systolic Heart Failure History of Afib, in afib, rate controlled, was previously on Diltiazem 58m q8h and metoprolol 272mPO BID. Patient  had episode of Afib with RVR during night and started on amiodarone gtt. -Will continue amiodarone for surgery and reassess after. -Continue telemetry -Will consider restarting home antiarrythmic and GDMT depending on HD status and BP improvement  HTN Hypotensive currently -Resume home meds when appropriate  Anemia of Chronic Disease (Baseline 8-9) -9.3 today -Transfuse pRBC if Hgb <7 -Trend CBC  ESRD (Tu,Th,S dialysis) Last dialysed on Saturday 5/7 with 2L pulled off.  -Nephrology consulted; plan for HD today -Trend CMP, MG, Phos  Moderate Malnutrition related to Chronic Illness -Nutrition following -Cortrak NG tube placement after surgery Nutrition recommend: - Vital 1.5 @ 60 ml/hr (1440 ml/day) - ProSource TF 45 ml QID  DM II -Continue SSI -Blood Glucose goal 140-180  Depression -Resume home meds when appropriate  Pelvic dermoid/teratoma Per [place] [facility] CT on 5/9 -Follow up with PCP  Best practice (right click and "Reselect all SmartList Selections" daily)  Diet:  NPO; can resume feeds after surgery Pain/Anxiety/Delirium protocol (if indicated): No VAP protocol (if indicated): Not indicated DVT prophylaxis: Subcutaneous Heparin GI prophylaxis: N/A Glucose control:  SSI Yes Central venous access:  N/A- RT IJ tunneled cath for HD Arterial line:  N/A Foley:  N/A Mobility:  bed rest  PT consulted: N/A Last date of multidisciplinary goals of care discussion: Updated son, JaUlice Dashvia phone 5/10; called 5/12 but no answering machine to leave VM. Code Status:  [facility] Disposition: ICU  Labs   CBC: Recent Labs  Lab 01/01/21 2039 01/02/21 0130 01/03/21 0607 01/04/21 0135  WBC 16.2* 15.4* 14.1* 10.7*  NEUTROABS 15.2*  --   --   --   HGB 8.8* 8.7* 9.3* 8.4*  HCT 29.3* 29.3* 31.5* 28.1*  MCV 94.5 94.5 94.6 92.1  PLT 158 140* 179 123*    Basic Metabolic Panel: Recent Labs  Lab 01/01/21 2039 01/02/21 0130 01/03/21 0607 01/03/21 2248  01/04/21 0135  NA 133* 133* 133* 132* 134*  K 5.6* 5.2* 5.4* 3.5 3.8  CL 98 99 98 96* 98  CO2 20* 21* 19* 25 26  GLUCOSE 162* 177* 160* 144* 110*  BUN 55* 56* 63* 26* 27*  CREATININE 4.18* 4.32* 4.65* 2.41* 2.66*  CALCIUM 7.9* 8.0* 7.8* 7.7* 7.7*  MG 1.9 1.9  --  1.9  --   PHOS 6.1* 6.5*  --   --   --    GFR: Estimated Creatinine Clearance: 27.6 mL/min (A) (by C-G formula based on SCr of 2.66 mg/dL (H)). Recent Labs  Lab 01/01/21 2039 01/02/21 0130 01/02/21 0853 01/03/21 0607 01/04/21 0135  PROCALCITON 1.32  --   --   --   --   WBC 16.2* 15.4*  --  14.1* 10.7*  LATICACIDVEN 1.5  --  2.1*  --   --     Liver Function Tests: Recent Labs  Lab 01/01/21 2039  AST 16  ALT 7  ALKPHOS 198*  BILITOT 0.7  PROT 6.2*  ALBUMIN 2.3*   No results for input(s): LIPASE, AMYLASE in the last 168 hours. No results for input(s): AMMONIA in the last 168 hours.  ABG  Component Value Date/Time   PHART 7.274 (L) 11/29/2020 0905   PCO2ART 51.5 (H) 11/29/2020 0905   PO2ART 68.5 (L) 11/29/2020 0905   HCO3 23.5 11/29/2020 0905   ACIDBASEDEF 2.6 (H) 11/29/2020 0905   O2SAT 92.7 11/29/2020 0905     Coagulation Profile: No results for input(s): INR, PROTIME in the last 168 hours.  Cardiac Enzymes: No results for input(s): CKTOTAL, CKMB, CKMBINDEX, TROPONINI in the last 168 hours.  HbA1C: Hgb A1c MFr Bld  Date/Time Value Ref Range Status  01/02/2021 01:30 AM 5.8 (H) 4.8 - 5.6 % Final    Comment:    (NOTE) Pre diabetes:          5.7%-6.4%  Diabetes:              >6.4%  Glycemic control for   <7.0% adults with diabetes   10/19/2020 03:58 AM 6.8 (H) 4.8 - 5.6 % Final    Comment:    (NOTE) Pre diabetes:          5.7%-6.4%  Diabetes:              >6.4%  Glycemic control for   <7.0% adults with diabetes     CBG: Recent Labs  Lab 01/03/21 1542 01/03/21 1919 01/03/21 2343 01/04/21 0341 01/04/21 0745  GLUCAP 158* 136* 106* 102* 114*    Review of Systems:   Able  to participate in parts of ROS. Denies chest pain, shortness of breath. Unable to obtain complete full ROS due to patient status and confusion.  Past Medical History:  Per Chart review: Unable to obtain from patient COPD [facility] HTN Depression DM II ESRD (Tu,Th,S dialysis), Afib with RVR Chronic systolic heart failure Known wounds to her lower extremities, groin, glutes, and coccyx Nec fasciitis s/p surgical debridement with MRSA, Morganella and E. Coli infection. Klebsiella bacteremia/pneumonia  Surgical History:  Per chart review:Unable to obtain from patient Nec fascitis s/p surgical debridement   Social History:      Family History:  Her family history is not on file.   Allergies Allergies  Allergen Reactions  . Atorvastatin Other (See Comments)    Unknown reaction  . Canagliflozin Other (See Comments)    Unknown   . Levofloxacin Other (See Comments)    Unknown  . Lisinopril Other (See Comments)    Unknown  . Other     Orange dye   . Vancomycin Other (See Comments)    Unknown  . Hydrochlorothiazide Nausea Only     Home Medications  Prior to Admission medications   Not on File     Critical care time: [place], [place]

## 2021-01-04 NOTE — Transfer of Care (Signed)
Immediate Anesthesia Transfer of Care Note  Patient: Theresa Russell  Procedure(s) Performed: INCISION AND DRAINAGE OF PERIRECTAL ABSCESS (N/A )  Patient Location: ICU  Anesthesia Type:General  Level of Consciousness: Patient remains intubated per anesthesia plan  Airway & Oxygen Therapy: Patient placed on Ventilator (see vital sign flow sheet for setting)  Post-op Assessment: Report given to RN and Post -op Vital signs reviewed and stable  Post vital signs: Reviewed  Last Vitals:  Vitals Value Taken Time  BP 92/75 01/04/21 1346  Temp    Pulse 88 01/04/21 1356  Resp 18 01/04/21 1358  SpO2 98 % 01/04/21 1356  Vitals shown include unvalidated device data.  Last Pain:  Vitals:   01/04/21 0715  TempSrc: Axillary         Complications: No complications documented.

## 2021-01-04 NOTE — Progress Notes (Signed)
Nutrition Follow-up  DOCUMENTATION CODES:   Obesity unspecified  INTERVENTION:   Initiate tube feeds via Cortrak: - Start Vital 1.5 @ 20 ml/hr and advance by 10 ml q 4 hours to goal rate of 45 ml/hr (1080 ml/day) - ProSource TF 90 ml TID  Tube feeding regimen at goal rate provides 1860 kcal, 139 grams of protein, and 825 ml of H2O.  - renal MVI daily per tube  NUTRITION DIAGNOSIS:   Moderate Malnutrition related to chronic illness (ESRD, COPD, CHF) as evidenced by mild fat depletion,moderate muscle depletion.  Ongoing, being addressed via initiation of TF  GOAL:   Patient will meet greater than or equal to 90% of their needs  Will be met via TF at goal  MONITOR:   Vent status,Labs,Weight trends,TF tolerance,Skin,I & O's  REASON FOR ASSESSMENT:   Consult Enteral/tube feeding initiation and management  ASSESSMENT:   60 year old female who presented to [facility] on 5/10 from [facility]. Pt found to be septic with multiple wounds draining purulent material. PMH of COPD, HTN, CHF, atrial fibrillation, depression, T2DM, ESRD on HD, multiple wounds, necrotizing fasciitis. Pt admitted with sepsis.  5/10 - I&D of L medial thigh abscess 5/13 - s/p I&D of perirectal abscess, Cortrak placed (tip gastric)  Discussed pt with RN and during ICU rounds. At this time pt was in the OR. Noted last HD was on 5/12 with 0 net UF.  Pt returned from the OR later today intubated on ventilatory support. Possible extubation later today or tomorrow per discussion with RN.  Cortrak placed for initiation of enteral nutrition. Consult received for tube feeding initiation and management.  Admit weight: 95.5 kg Current weight: 98.2 kg  Pt with non-pitting edema to BUE and deep pitting edema to BLE.  Patient is currently intubated on ventilator support MV: 8.2 L/min Temp (24hrs), Avg:97.6 F (36.4 C), Min:97.2 F (36.2 C), Max:97.9 F (36.6 C)  Drips: Amiodarone Neosynephrine    Medications reviewed and include: aranesp, IV solu-cortef, SSI q 4 hours, IV abx  Labs reviewed: sodium 134, BUN 27, creatinine 2.66, lactic acid 2.1, hemoglobin 8.4 CBG's: 102-158 x 24 hours  I/O's: +5.4 L since admit  Diet Order:   Diet Order            Diet NPO time specified  Diet effective now                 EDUCATION NEEDS:   Not appropriate for education at this time  Skin:  Skin Assessment: Skin Integrity Issues: Stage II: L ischial tuberosity Unstageable: sacrum Incisions: L thigh x 2, perineum Other: pressure injury to R hip; skin tear to groin; multiple non-pressure wounds to feet, ankles, legs  Last BM:  01/04/21 large type 6  Height:   Ht Readings from Last 1 Encounters:  01/02/21 _0  (1.676 m)    Weight:   Wt Readings from Last 1 Encounters:  01/04/21 98.2 kg    Ideal Body Weight:  59.1 kg  BMI:  Body mass index is 34.94 kg/m.  Estimated Nutritional Needs:   Kcal:  1750  Protein:  120-140 grams  Fluid:  1000 ml + UOP    Gustavus Bryant, MS, RD, LDN Inpatient Clinical Dietitian Please see AMiON for contact information.

## 2021-01-04 NOTE — Anesthesia Procedure Notes (Signed)
Procedure Name: Intubation Date/Time: 01/04/2021 12:13 PM Performed by: Janene Harvey, CRNA Pre-anesthesia Checklist: Patient identified, Emergency Drugs available, Suction available and Patient being monitored Patient Re-evaluated:Patient Re-evaluated prior to induction Oxygen Delivery Method: Circle system utilized Preoxygenation: Pre-oxygenation with 100% oxygen Induction Type: IV induction Ventilation: Mask ventilation without difficulty Laryngoscope Size: Glidescope and 3 Grade View: Grade I Tube type: Oral Tube size: 7.0 mm Number of attempts: 1 Airway Equipment and Method: Stylet and Oral airway Placement Confirmation: ETT inserted through vocal cords under direct vision,  positive ETCO2 and breath sounds checked- equal and bilateral Secured at: 21 cm Tube secured with: Tape Dental Injury: Teeth and Oropharynx as per pre-operative assessment

## 2021-01-04 NOTE — Progress Notes (Signed)
[facility]  Date of Admission:  01/01/2021           Reason for visit: Follow up on abscess  Current antibiotics: Linezolid Meropenem  ASSESSMENT:    1. Left thigh and perineal abscess: s/p I&D at bedside 01/01/21.  Currently on meropenem and linezolid and anticipating return to OR for further debridement later this morning.  2. Septic shock: secondary to above 3. ESRD on HD 4. A fib with RVR  PLAN:    . Continue linezolid and meropenem dosed renally . OR today and will follow up cultures from this . Dr Tommy Medal is available as needed over the weekend.  I will be back Monday.   Principal Problem:   Septic shock ([place]) Active Problems:   Acute respiratory failure with hypoxia (HCC)   Acute metabolic encephalopathy   Thigh abscess   ESRD (end stage renal disease) (HCC)    MEDICATIONS:    Scheduled Meds: . acetaminophen  1,000 mg Oral Once  . [MAR Hold] Chlorhexidine Gluconate Cloth  6 each Topical Q0600  . [MAR Hold] darbepoetin (ARANESP) injection - DIALYSIS  100 mcg Intravenous Q Thu-HD  . [MAR Hold] heparin  5,000 Units Subcutaneous Q8H  . [MAR Hold] hydrocortisone sod succinate (SOLU-CORTEF) inj  50 mg Intravenous Q6H  . [MAR Hold] insulin aspart  0-9 Units Subcutaneous Q4H   Continuous Infusions: . sodium chloride 10 mL/hr at 01/04/21 0600  . sodium chloride    . amiodarone 30 mg/hr (01/04/21 0600)  . [MAR Hold] linezolid (ZYVOX) IV Stopped (01/03/21 2303)  . [MAR Hold] meropenem (MERREM) IV Stopped (01/03/21 1627)  . [MAR Hold] phenylephrine (NEO-SYNEPHRINE) Adult infusion Stopped (01/03/21 2342)   PRN Meds:.[MAR Hold] docusate sodium, [MAR Hold] ipratropium-albuterol, [MAR Hold] polyethylene glycol  SUBJECTIVE:    Seems more confused this morning but is alert.  Denies fevers, chills. Reports pain in her backside and legs.   Review of Systems  All other systems reviewed and are negative.     OBJECTIVE:   Blood  pressure 92/72, pulse (!) 109, temperature (!) 97.5 F (36.4 C), temperature source Axillary, resp. rate 10, height 5\' 6"  (1.676 m), weight 98.2 kg, SpO2 94 %. Body mass index is 34.94 kg/m.  Physical Exam Constitutional:      General: She is not in acute distress.    Comments: Chronically ill appearing, lying in bed.   HENT:     Head: Normocephalic and atraumatic.  Eyes:     Extraocular Movements: Extraocular movements intact.     Conjunctiva/sclera: Conjunctivae normal.  Pulmonary:     Effort: Pulmonary effort is normal. No respiratory distress.  Skin:    General: Skin is warm and dry.  Neurological:     General: No focal deficit present.  Psychiatric:        Mood and Affect: Mood normal.        Behavior: Behavior normal.      Lab Results: Lab Results  Component Value Date   WBC 10.7 (H) 01/04/2021   HGB 8.4 (L) 01/04/2021   HCT 28.1 (L) 01/04/2021   MCV 92.1 01/04/2021   PLT 123 (L) 01/04/2021    Lab Results  Component Value Date   NA 134 (L) 01/04/2021   K 3.8 01/04/2021   CO2 26 01/04/2021   GLUCOSE 110 (H) 01/04/2021   BUN 27 (H) 01/04/2021   CREATININE 2.66 (H) 01/04/2021   CALCIUM 7.7 (L) 01/04/2021   GFRNONAA 20 (L)  01/04/2021    Lab Results  Component Value Date   ALT 7 01/01/2021   AST 16 01/01/2021   ALKPHOS 198 (H) 01/01/2021   BILITOT 0.7 01/01/2021    No results found for: CRP  No results found for: ESRSEDRATE   I have reviewed the micro and lab results in Epic.  Imaging: DG CHEST PORT 1 VIEW  Result Date: 01/02/2021 CLINICAL DATA:  Hypoxia. EXAM: PORTABLE CHEST 1 VIEW COMPARISON:  11/26/2020 FINDINGS: Dialysis catheter tip at high right atrium. Midline trachea. Cardiomegaly accentuated by AP portable technique. Atherosclerosis in the transverse aorta. Mild right hemidiaphragm elevation. No pleural effusion or pneumothorax. Right greater than left lower lung predominant interstitial and airspace disease is relatively similar to on the  prior, given differences in technique. IMPRESSION: Cardiomegaly with interstitial and airspace disease which could represent infection or pulmonary edema. Aortic Atherosclerosis (ICD10-I70.0). Electronically Signed   By: Abigail Miyamoto M.D.   On: 01/02/2021 14:03   ECHOCARDIOGRAM LIMITED  Result Date: 01/02/2021    ECHOCARDIOGRAM LIMITED REPORT   Patient Name:   Theresa Russell Date of Exam: 01/02/2021 Medical Rec #:  [id number]           Height:       66.0 in Accession #:    [id number]          Weight:       210.5 lb Date of Birth:  [birth year]          BSA:          2.044 m Patient Age:    60 years            BP:           90/70 mmHg Patient Gender: F                   HR:           105 bpm. Exam Location:  Inpatient Procedure: Limited Echo Indications:    Hx of sepsis  Referring Phys: [id number] [facility] J[facility] ELLISON IMPRESSIONS  1. Left ventricular ejection fraction, by estimation, is 55 to 60%. The left ventricle has normal function. There is moderate asymmetric left ventricular hypertrophy.  2. Right ventricular systolic function is moderately reduced. The right ventricular size is normal.  3. The mitral valve is normal in structure. Mild mitral valve regurgitation.  4. The aortic valve is tricuspid. Aortic valve regurgitation is not visualized.  5. The inferior vena cava is dilated in size with <50% respiratory variability, suggesting right atrial pressure of 15 mmHg. FINDINGS  Left Ventricle: Left ventricular ejection fraction, by estimation, is 55 to 60%. The left ventricle has normal function. The left ventricular internal cavity size was normal in size. There is moderate asymmetric left ventricular hypertrophy. Right Ventricle: The right ventricular size is normal. Right ventricular systolic function is moderately reduced. Left Atrium: Left atrial size was normal in size. Right Atrium: Right atrial size was normal in size. Pericardium: There is no evidence of pericardial effusion. Mitral Valve: The mitral  valve is normal in structure. Mild mitral valve regurgitation. Tricuspid Valve: The tricuspid valve is normal in structure. Tricuspid valve regurgitation is mild. Aortic Valve: The aortic valve is tricuspid. Aortic valve regurgitation is not visualized. Pulmonic Valve: The pulmonic valve was normal in structure. Pulmonic valve regurgitation is mild. Aorta: The aortic root and ascending aorta are structurally normal, with no evidence of dilitation. Venous: The inferior vena cava is dilated in size with less than 50%  respiratory variability, suggesting right atrial pressure of 15 mmHg. IAS/Shunts: No atrial level shunt detected by color flow Doppler. LEFT VENTRICLE PLAX 2D LVIDd:         3.70 cm LVIDs:         2.20 cm LV PW:         1.10 cm LV IVS:        1.60 cm LVOT diam:     1.80 cm LVOT Area:     2.54 cm  RIGHT VENTRICLE          IVC RV Basal diam:  3.10 cm  IVC diam: 2.35 cm RV Mid diam:    2.60 cm LEFT ATRIUM             Index       RIGHT ATRIUM           Index LA diam:        4.60 cm 2.25 cm/m  RA Area:     15.40 cm LA Vol (A2C):   63.9 ml 31.26 ml/m RA Volume:   35.40 ml  17.32 ml/m LA Vol (A4C):   48.1 ml 23.53 ml/m LA Biplane Vol: 55.3 ml 27.06 ml/m   AORTA Ao Root diam: 3.60 cm Ao Asc diam:  3.10 cm  SHUNTS Systemic Diam: 1.80 cm Dorris Carnes MD Electronically signed by Dorris Carnes MD Signature Date/Time: 01/02/2021/5:09:31 PM    Final      Imaging  independently reviewed in Epic.    [facility] [phone] pager 01/04/2021, 10:38 AM

## 2021-01-04 NOTE — Progress Notes (Signed)
Subjective:  HD late last night-  Ran even.  Pressors had been stopped-  Some soft BPs this AM- also some issue with A fib-  Pt alert but seems slightly confused    Objective Vital signs in last 24 hours: Vitals:   01/04/21 0800 01/04/21 0815 01/04/21 0830 01/04/21 0845  BP: 108/90 96/82 92/72    Pulse: (!) 121 (!) 115 (!) 109 (!) 109  Resp: 13 14 13 10   Temp:      TempSrc:      SpO2: 91% 95% 96% 94%  Weight:      Height:       Weight change: 1.5 kg  Intake/Output Summary (Last 24 hours) at 01/04/2021 0907 Last data filed at 01/04/2021 0600 Gross per 24 hour  Intake 1308.19 ml  Output 0 ml  Net 1308.19 ml   Dialyzes at [facility]  TTS 4 hours  EDW 93 kg. HD Bath 2/2.5, Dialyzer unknown, Heparin yes- 2000 per hour. Access TDC.BFR 350/dfr 500 epogen 8000 per tx, iron weekly    Assessment/Plan: 60 year old BF with multiple medical issues including ESRD and chronic wounds.  She now has what appears to be sepsis in the setting of these wounds requiring pressor support  1 wounds/sepsis-  S/p a brief debridement per surgery-  Likely needs more- planned for today -  Also meropenam and zyvox 2 ESRD: normally TTS at [facility] via [facility]- had HD last night.   Planning on next HD Saturday.  Hopefully will be hemodynamically stable enough to do IHD with some volume removal  3 Hypertension: hypotensive likely due to sepsis-  Volume seems generous based on edema but also likely third spacing due to hypoalbuminemia.  No volume removal with HD yesterday  4. Anemia of ESRD: chronic issue-  Will cont ESA-  No iron due to infections 5. 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  Phos up right now but not emergent to treat -  Will come down with HD 6. Dispo-  I do not know this lady so do not know her baseline MS-  son is POA and wishes for continued aggressive therapy.  She has perked up quite a bit but still slightly confused    [place]: Basic Metabolic Panel: Recent Labs  Lab 01/01/21 2039 01/02/21 0130 01/03/21 0607 01/03/21 2248 01/04/21 0135  NA 133* 133* 133* 132* 134*  K 5.6* 5.2* 5.4* 3.5 3.8  CL 98 99 98 96* 98  CO2 20* 21* 19* 25 26  GLUCOSE 162* 177* 160* 144* 110*  BUN 55* 56* 63* 26* 27*  CREATININE 4.18* 4.32* 4.65* 2.41* 2.66*  CALCIUM 7.9* 8.0* 7.8* 7.7* 7.7*  PHOS 6.1* 6.5*  --   --   --    Liver Function Tests: Recent Labs  Lab 01/01/21 2039  AST 16  ALT 7  ALKPHOS 198*  BILITOT 0.7  PROT 6.2*  ALBUMIN 2.3*   No results for input(s): LIPASE, AMYLASE in the last 168 hours. No results for input(s): AMMONIA in the last 168 hours. CBC: Recent Labs  Lab 01/01/21 2039 01/02/21 0130 01/03/21 0607 01/04/21 0135  WBC 16.2* 15.4* 14.1* 10.7*  NEUTROABS 15.2*  --   --   --   HGB 8.8* 8.7* 9.3* 8.4*  HCT 29.3* 29.3* 31.5* 28.1*  MCV 94.5 94.5 94.6 92.1  PLT 158 140* 179 123*   Cardiac Enzymes: No results for input(s):  CKTOTAL, CKMB, CKMBINDEX, TROPONINI in the last 168 hours. CBG: Recent Labs  Lab 01/03/21 1542 01/03/21 1919 01/03/21 2343 01/04/21 0341 01/04/21 0745  GLUCAP 158* 136* 106* 102* 114*    Iron Studies:  Recent Labs    01/04/21 0135  IRON 13*  TIBC UNABLE TO CALCULATE.  FERRITIN 420*   Studies/Results: DG CHEST PORT 1 VIEW  Result Date: 01/02/2021 CLINICAL DATA:  Hypoxia. EXAM: PORTABLE CHEST 1 VIEW COMPARISON:  11/26/2020 FINDINGS: Dialysis catheter tip at high right atrium. Midline trachea. Cardiomegaly accentuated by AP portable technique. Atherosclerosis in the transverse aorta. Mild right hemidiaphragm elevation. No pleural effusion or pneumothorax. Right greater than left lower lung predominant interstitial and airspace disease is relatively similar to on the prior, given differences in technique. IMPRESSION: Cardiomegaly with interstitial and airspace disease which could represent infection or pulmonary edema. Aortic Atherosclerosis  (ICD10-I70.0). Electronically Signed   By: Abigail Miyamoto M.D.   On: 01/02/2021 14:03   ECHOCARDIOGRAM LIMITED  Result Date: 01/02/2021    ECHOCARDIOGRAM LIMITED REPORT   Patient Name:   Theresa Russell Date of Exam: 01/02/2021 Medical Rec #:  [id number]           Height:       66.0 in Accession #:    [id number]          Weight:       210.5 lb Date of Birth:  1960-11-16          BSA:          2.044 m Patient Age:    60 years            BP:           90/70 mmHg Patient Gender: F                   HR:           105 bpm. Exam Location:  Inpatient Procedure: Limited Echo Indications:    Hx of sepsis  Referring Phys: [id number] [facility] JANE ELLISON IMPRESSIONS  1. Left ventricular ejection fraction, by estimation, is 55 to 60%. The left ventricle has normal function. There is moderate asymmetric left ventricular hypertrophy.  2. Right ventricular systolic function is moderately reduced. The right ventricular size is normal.  3. The mitral valve is normal in structure. Mild mitral valve regurgitation.  4. The aortic valve is tricuspid. Aortic valve regurgitation is not visualized.  5. The inferior vena cava is dilated in size with <50% respiratory variability, suggesting right atrial pressure of 15 mmHg. FINDINGS  Left Ventricle: Left ventricular ejection fraction, by estimation, is 55 to 60%. The left ventricle has normal function. The left ventricular internal cavity size was normal in size. There is moderate asymmetric left ventricular hypertrophy. Right Ventricle: The right ventricular size is normal. Right ventricular systolic function is moderately reduced. Left Atrium: Left atrial size was normal in size. Right Atrium: Right atrial size was normal in size. Pericardium: There is no evidence of pericardial effusion. Mitral Valve: The mitral valve is normal in structure. Mild mitral valve regurgitation. Tricuspid Valve: The tricuspid valve is normal in structure. Tricuspid valve regurgitation is mild. Aortic Valve: The  aortic valve is tricuspid. Aortic valve regurgitation is not visualized. Pulmonic Valve: The pulmonic valve was normal in structure. Pulmonic valve regurgitation is mild. Aorta: The aortic root and ascending aorta are structurally normal, with no evidence of dilitation. Venous: The inferior vena cava is dilated in size with less than 50% respiratory  variability, suggesting right atrial pressure of 15 mmHg. IAS/Shunts: No atrial level shunt detected by color flow Doppler. LEFT VENTRICLE PLAX 2D LVIDd:         3.70 cm LVIDs:         2.20 cm LV PW:         1.10 cm LV IVS:        1.60 cm LVOT diam:     1.80 cm LVOT Area:     2.54 cm  RIGHT VENTRICLE          IVC RV Basal diam:  3.10 cm  IVC diam: 2.35 cm RV Mid diam:    2.60 cm LEFT ATRIUM             Index       RIGHT ATRIUM           Index LA diam:        4.60 cm 2.25 cm/m  RA Area:     15.40 cm LA Vol (A2C):   63.9 ml 31.26 ml/m RA Volume:   35.40 ml  17.32 ml/m LA Vol (A4C):   48.1 ml 23.53 ml/m LA Biplane Vol: 55.3 ml 27.06 ml/m   AORTA Ao Root diam: 3.60 cm Ao Asc diam:  3.10 cm  SHUNTS Systemic Diam: 1.80 cm Dorris Carnes MD Electronically signed by Dorris Carnes MD Signature Date/Time: 01/02/2021/5:09:31 PM    Final    Medications: Infusions: . sodium chloride 10 mL/hr at 01/04/21 0600  . sodium chloride    . amiodarone 30 mg/hr (01/04/21 0600)  . linezolid (ZYVOX) IV Stopped (01/03/21 2303)  . meropenem (MERREM) IV Stopped (01/03/21 1627)  . phenylephrine (NEO-SYNEPHRINE) Adult infusion Stopped (01/03/21 2342)    Scheduled Medications: . Chlorhexidine Gluconate Cloth  6 each Topical Q0600  . [START ON 01/10/2021] darbepoetin (ARANESP) injection - DIALYSIS  100 mcg Intravenous Q Thu-HD  . heparin  5,000 Units Subcutaneous Q8H  . hydrocortisone sod succinate (SOLU-CORTEF) inj  50 mg Intravenous Q6H  . insulin aspart  0-9 Units Subcutaneous Q4H    have reviewed scheduled and prn medications.  Physical Exam: General:  Alert but slightly  confused   No c/o's Heart: tachy Lungs: dec BS at bases Abdomen: soft, non tender Extremities: some pitting edema-  Wounds on LE esp medial left thigh Dialysis Access: right sided TDC-  No obvious infection    01/04/2021,9:07 AM  LOS: 3 days

## 2021-01-05 ENCOUNTER — Inpatient Hospital Stay (HOSPITAL_COMMUNITY): Payer: Medicare Other

## 2021-01-05 ENCOUNTER — Inpatient Hospital Stay: Payer: Self-pay

## 2021-01-05 DIAGNOSIS — A419 Sepsis, unspecified organism: Secondary | ICD-10-CM | POA: Diagnosis not present

## 2021-01-05 DIAGNOSIS — L8990 Pressure ulcer of unspecified site, unspecified stage: Secondary | ICD-10-CM | POA: Insufficient documentation

## 2021-01-05 DIAGNOSIS — R6521 Severe sepsis with septic shock: Secondary | ICD-10-CM | POA: Diagnosis not present

## 2021-01-05 LAB — CBC
HCT: 31.5 % — ABNORMAL LOW (ref 36.0–46.0)
Hemoglobin: 9.6 g/dL — ABNORMAL LOW (ref 12.0–15.0)
MCH: 27.9 pg (ref 26.0–34.0)
MCHC: 30.5 g/dL (ref 30.0–36.0)
MCV: 91.6 fL (ref 80.0–100.0)
Platelets: 88 10*3/uL — ABNORMAL LOW (ref 150–400)
RBC: 3.44 MIL/uL — ABNORMAL LOW (ref 3.87–5.11)
RDW: 16.8 % — ABNORMAL HIGH (ref 11.5–15.5)
WBC: 9.5 10*3/uL (ref 4.0–10.5)
nRBC: 3.8 % — ABNORMAL HIGH (ref 0.0–0.2)

## 2021-01-05 LAB — BASIC METABOLIC PANEL
Anion gap: 17 — ABNORMAL HIGH (ref 5–15)
BUN: 35 mg/dL — ABNORMAL HIGH (ref 6–20)
CO2: 17 mmol/L — ABNORMAL LOW (ref 22–32)
Calcium: 7.3 mg/dL — ABNORMAL LOW (ref 8.9–10.3)
Chloride: 98 mmol/L (ref 98–111)
Creatinine, Ser: 3.29 mg/dL — ABNORMAL HIGH (ref 0.44–1.00)
GFR, Estimated: 16 mL/min — ABNORMAL LOW (ref >60)
Glucose, Bld: 252 mg/dL — ABNORMAL HIGH (ref 70–99)
Potassium: 4 mmol/L (ref 3.5–5.1)
Sodium: 132 mmol/L — ABNORMAL LOW (ref 135–145)

## 2021-01-05 LAB — GLUCOSE, CAPILLARY
Glucose-Capillary: 248 mg/dL — ABNORMAL HIGH (ref 70–99)
Glucose-Capillary: 244 mg/dL — ABNORMAL HIGH (ref 70–99)
Glucose-Capillary: 287 mg/dL — ABNORMAL HIGH (ref 70–99)
Glucose-Capillary: 247 mg/dL — ABNORMAL HIGH (ref 70–99)
Glucose-Capillary: 236 mg/dL — ABNORMAL HIGH (ref 70–99)
Glucose-Capillary: 319 mg/dL — ABNORMAL HIGH (ref 70–99)

## 2021-01-05 LAB — BLOOD GAS, VENOUS
Acid-base deficit: 5.7 mmol/L — ABNORMAL HIGH (ref 0.0–2.0)
Bicarbonate: 20.6 mmol/L (ref 20.0–28.0)
Drawn by: 4653
O2 Saturation: 52.2 %
Patient temperature: 37
pCO2, Ven: 51 mmHg (ref 44.0–60.0)
pH, Ven: 7.231 — ABNORMAL LOW (ref 7.250–7.430)
pO2, Ven: 51 mmHg — ABNORMAL HIGH (ref 32.0–45.0)

## 2021-01-05 LAB — PHOSPHORUS: Phosphorus: 5 mg/dL — ABNORMAL HIGH (ref 2.5–4.6)

## 2021-01-05 LAB — CORTISOL: Cortisol, Plasma: >100 ug/dL

## 2021-01-05 LAB — AMMONIA: Ammonia: 45 umol/L — ABNORMAL HIGH (ref 9–35)

## 2021-01-05 LAB — MAGNESIUM: Magnesium: 1.7 mg/dL (ref 1.7–2.4)

## 2021-01-05 MED ORDER — PANTOPRAZOLE SODIUM 40 MG IV SOLR
40.0000 mg | INTRAVENOUS | Status: DC
  Administered 2021-01-05: 40 mg via INTRAVENOUS
  Filled 2021-01-05: qty 40

## 2021-01-05 MED ORDER — PANTOPRAZOLE SODIUM 40 MG PO PACK
40.0000 mg | PACK | Freq: Every day | ORAL | Status: DC
  Administered 2021-01-05 – 2021-01-08 (×4): 40 mg
  Filled 2021-01-05 (×4): qty 20

## 2021-01-05 MED ORDER — MAGNESIUM SULFATE 2 GM/50ML IV SOLN
2.0000 g | Freq: Once | INTRAVENOUS | Status: AC
  Administered 2021-01-05: 2 g via INTRAVENOUS
  Filled 2021-01-05: qty 50

## 2021-01-05 MED ORDER — INSULIN ASPART 100 UNIT/ML IJ SOLN
0.0000 [IU] | INTRAMUSCULAR | Status: DC
  Administered 2021-01-05: 8 [IU] via SUBCUTANEOUS
  Administered 2021-01-05 (×3): 5 [IU] via SUBCUTANEOUS
  Administered 2021-01-06: 11 [IU] via SUBCUTANEOUS

## 2021-01-05 MED ORDER — DOCUSATE SODIUM 50 MG/5ML PO LIQD: 100.0000 mg | Freq: Two times a day (BID) | ORAL | Status: DC | PRN

## 2021-01-05 MED ORDER — IPRATROPIUM-ALBUTEROL 0.5-2.5 (3) MG/3ML IN SOLN
3.0000 mL | Freq: Four times a day (QID) | RESPIRATORY_TRACT | Status: DC
  Administered 2021-01-05 – 2021-01-06 (×6): 3 mL via RESPIRATORY_TRACT
  Filled 2021-01-05 (×6): qty 3

## 2021-01-05 MED ORDER — POLYETHYLENE GLYCOL 3350 17 G PO PACK
17.0000 g | PACK | Freq: Every day | ORAL | Status: DC | PRN
Start: 2021-01-05 — End: 2021-01-09

## 2021-01-05 MED ORDER — PNEUMOCOCCAL VAC POLYVALENT 25 MCG/0.5ML IJ INJ
0.5000 mL | INJECTION | INTRAMUSCULAR | Status: DC | PRN
  Filled 2021-01-05: qty 0.5

## 2021-01-05 MED ORDER — HYDROCORTISONE NA SUCCINATE PF 100 MG IJ SOLR
100.0000 mg | Freq: Two times a day (BID) | INTRAMUSCULAR | Status: DC
  Administered 2021-01-05 – 2021-01-06 (×2): 100 mg via INTRAVENOUS
  Filled 2021-01-05 (×2): qty 2

## 2021-01-05 MED ORDER — INSULIN GLARGINE 100 UNIT/ML ~~LOC~~ SOLN
8.0000 [IU] | Freq: Every day | SUBCUTANEOUS | Status: DC
  Administered 2021-01-05: 8 [IU] via SUBCUTANEOUS
  Filled 2021-01-05 (×2): qty 0.08

## 2021-01-05 NOTE — Progress Notes (Signed)
[facility] with Dr Moshe Cipro, Nephrology, states ok to place PICC line.

## 2021-01-05 NOTE — Progress Notes (Signed)
Subjective:  Had debridement yesterday of perirectal abscess-  remained intubated-  Also has required resumption of pressors.  Was going to do HD today but seems too unstable  Objective Vital signs in last 24 hours: Vitals:   01/05/21 0700 01/05/21 0715 01/05/21 0730 01/05/21 0745  BP: (!) 79/55 (!) 87/72  95/74  Pulse: 98 88 100 86  Resp: 17 (!) 0 (!) 24 (!) 9  Temp:      TempSrc:      SpO2: 97% 96% 90% 97%  Weight:      Height:       Weight change: 1.6 kg  Intake/Output Summary (Last 24 hours) at 01/05/2021 0753 Last data filed at 01/05/2021 0600 Gross per 24 hour  Intake 2375.03 ml  Output 50 ml  Net 2325.03 ml   Dialyzes at [facility]  TTS 4 hours  EDW 93 kg. HD Bath 2/2.5, Dialyzer unknown, Heparin yes- 2000 per hour. Access TDC.BFR 350/dfr 500 epogen 8000 per tx, iron weekly    Assessment/Plan: 60 year old BF with multiple medical issues including ESRD and chronic wounds.  She now has what appears to be sepsis in the setting of these wounds requiring pressor support  1 wounds/sepsis-  S/p a brief debridement per surgery upon admission-  s/p more extensive debridement 5/13-  Also meropenam and zyvox 2 ESRD: normally TTS at [facility] via [facility]- had HD last on 5/12.   Was going to do IHD today but is too unstable right now.  No acute needs-  Would prefer to avoid CRRT if can-  Will give 24 hours to see if stablilizes but if not will need CRRT on 5/15 3 Hypertension: hypotensive likely due to sepsis-  Volume seems generous based on edema but also likely third spacing due to hypoalbuminemia.  No volume removal with HD on 5/12-  Will need some at some point  4. Anemia of ESRD: chronic issue-  Will cont ESA-  No iron due to infections 5. 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  Phos up right now but not emergent to treat -  Will come down with HD    [place]: Basic Metabolic Panel: Recent Labs   Lab 01/01/21 2039 01/02/21 0130 01/03/21 0607 01/03/21 2248 01/04/21 0135 01/05/21 0701  NA 133* 133*   < > 132* 134* 132*  K 5.6* 5.2*   < > 3.5 3.8 4.0  CL 98 99   < > 96* 98 98  CO2 20* 21*   < > 25 26 17*  GLUCOSE 162* 177*   < > 144* 110* 252*  BUN 55* 56*   < > 26* 27* 35*  CREATININE 4.18* 4.32*   < > 2.41* 2.66* 3.29*  CALCIUM 7.9* 8.0*   < > 7.7* 7.7* 7.3*  PHOS 6.1* 6.5*  --   --   --  5.0*   < > = values in this interval not displayed.   Liver Function Tests: Recent Labs  Lab 01/01/21 2039  AST 16  ALT 7  ALKPHOS 198*  BILITOT 0.7  PROT 6.2*  ALBUMIN 2.3*   No results for input(s): LIPASE, AMYLASE in the last 168 hours. No results for input(s): AMMONIA in the last 168 hours. CBC: Recent Labs  Lab 01/01/21 2039 01/02/21 0130 01/03/21 0607 01/04/21 0135 01/05/21 0240  WBC 16.2* 15.4* 14.1* 10.7* 9.5  NEUTROABS 15.2*  --   --   --   --  HGB 8.8* 8.7* 9.3* 8.4* 9.6*  HCT 29.3* 29.3* 31.5* 28.1* 31.5*  MCV 94.5 94.5 94.6 92.1 91.6  PLT 158 140* 179 123* 88*   Cardiac Enzymes: No results for input(s): CKTOTAL, CKMB, CKMBINDEX, TROPONINI in the last 168 hours. CBG: Recent Labs  Lab 01/04/21 1350 01/04/21 1553 01/04/21 1941 01/04/21 2328 01/05/21 0411  GLUCAP 145* 146* 181* 266* 248*    Iron Studies:  Recent Labs    01/04/21 0135  IRON 13*  TIBC UNABLE TO CALCULATE.  FERRITIN 420*   Studies/Results: No results found. Medications: Infusions: . sodium chloride    . amiodarone 30 mg/hr (01/05/21 0600)  . dexmedetomidine (PRECEDEX) IV infusion 0.8 mcg/kg/hr (01/05/21 0600)  . feeding supplement (VITAL 1.5 CAL) 45 mL/hr at 01/05/21 0300  . linezolid (ZYVOX) IV Stopped (01/04/21 2208)  . magnesium sulfate bolus IVPB    . meropenem (MERREM) IV Stopped (01/04/21 1519)  . phenylephrine (NEO-SYNEPHRINE) Adult infusion Stopped (01/05/21 0419)    Scheduled Medications: . chlorhexidine gluconate (MEDLINE KIT)  15 mL Mouth Rinse BID  .  Chlorhexidine Gluconate Cloth  6 each Topical Q0600  . [START ON 01/10/2021] darbepoetin (ARANESP) injection - DIALYSIS  100 mcg Intravenous Q Thu-HD  . feeding supplement (PROSource TF)  90 mL Per Tube TID  . heparin  5,000 Units Subcutaneous Q8H  . hydrocortisone sod succinate (SOLU-CORTEF) inj  50 mg Intravenous Q8H  . insulin aspart  0-9 Units Subcutaneous Q4H  . mouth rinse  15 mL Mouth Rinse 10 times per day  . multivitamin  1 tablet Per Tube QHS  . pantoprazole (PROTONIX) IV  40 mg Intravenous Q24H    have reviewed scheduled and prn medications.  Physical Exam: General:  Intubated and sedated Heart: tachy Lungs: dec BS at bases Abdomen: soft, non tender Extremities: some pitting edema-  Wounds on LE esp medial left thigh Dialysis Access: right sided TDC-  No obvious infection    01/05/2021,7:53 AM  LOS: 4 days

## 2021-01-05 NOTE — Progress Notes (Signed)
NAME:  [name], MRN:  [id number], DOB:  [birth year], LOS: 4 ADMISSION DATE:  01/01/2021, CONSULTATION DATE:  5/10 REFERRING MD:  Jerelene Redden CHIEF COMPLAINT:  Sepsis  History of Present Illness:  [name] is a 60 y.o. F who was transferred from [facility] with sepsis.  She has a pertinent past medical history of COPD [facility]), HTN, systolic HF, afib depression, DM II, ESRD (Tu,Th,S dialysis), known wounds to her lower extremities, groin, glutes, and coccyx due to complicated hospital stay from February to April of this year due to necrotizing fasciitis with Morganella, E. Coli, and MRSA and later complicated by klebsiella pneumonia and klebsiella bacteremia. She was treated with a myriad of antibiotics including zosyn, clindamycin, cefepime, Zyvox, doxycycline and meropenem and multiple debridements. She reportedly does not ambulate and was residing in a SNF.  Per chart review and discussion with her son Theresa Dash, Ms. Gumbs was transferred from her SNF to [facility] on 5/9 with concerns of altered mental status. She was found to be hypotensive and hypoxic on assesment. He last dialysis was Saturday where is was reported that 2L was pulled off. She was started on levophed, 3L of IVF was given, cultures were drawn, and she was started on Linezolid and Meropenem. A head CT was completed which was read by outside radiology as negative for acute process. A CT chest, ABD, and pelvis was obtained which was negative for PE but concerning for "crazy paving" with extensive infilitrate of both lungs, air fluid collection of the left anterior perineum and left medial thigh measuring 5.5 cm which was concerning for an abcess, pelvic dermoid/teratoma. She was found to have a leukocytosis of 13.1, lactate of 2.1 which increased to 2.5, and troponin I of 265. During transfer to [facility] on 5/10 she received 271m LR bolus and found to be normotensive, confused and off of pressors at time of  arrival.  PCCM was consulted for admission for sepsis.  Pertinent  Medical History  COPD (2LNC) HTN Depression DM II ESRD (Tu,Th,S dialysis), Afib with RVR Nec fasciitis s/p surgical debridement with MRSA, Morganella and E. Coli infection. Klebsiella bacteremia/pneumonia Chronic systolic heart failure Known wounds to her lower extremities, groin, glutes, and coccyx.  Significant Hospital Events: Including procedures, antibiotic start and stop dates in addition to other pertinent events    [facility]   3/17 Drainage of inner thigh abcess  4/27 RT IJ Temp cath removal, RT IJ  Tunneled HD cath insertion.  5/9 Presented to [facility] with AMS. Found to be hypotensive. Head CT  negative for acute process. CT with/without chest, ABD, and pelvis was obtained which was negative for PE but concerning for "crazy paving" with extensive infilitrate of both lungs, air fluid collection of the left anterior perineum and left medial thigh measuring 5.5 cm which was concerning for an abcess, pelvic dermoid/teratoma. Started on Linezolid and Meropenem  5/10 Transferred to [facility] Surgery performed I&D of left medial thigh wound with copious purulent discharge.  5/12 Episode of Afib with RVR to 150s during dialysis, switched from levophed to phenylephrine and amio started.  5/13 OR surgery for debridement  Antibiotics Linezolid 5/10>> Meropenem 5/11>>  Interim History / Subjective:   Patient remained intubated after her wound debridement yesterday. Did not tolerate SBT this AM due to poor mental status and low tidal volumes.    Objective   Blood pressure (!) 139/94, pulse 92, temperature 98.5 F (36.9 C), temperature source Axillary, resp.  rate (!) 0, height _0  (1.676 m), weight 99.8 kg, SpO2 96 %.    Vent Mode: PRVC FiO2 (%):  [40 %-100 %] 40 % Set Rate:  [8 bmp-18 bmp] 18 bmp Vt Set:  [470 mL] 470 mL PEEP:  [5 cmH20] 5 cmH20 Pressure Support:  [5  cmH20] 5 cmH20 Plateau Pressure:  [15 cmH20-21 cmH20] 15 cmH20   Intake/Output Summary (Last 24 hours) at 01/05/2021 1030 Last data filed at 01/05/2021 0901 Gross per 24 hour  Intake 2684.06 ml  Output 50 ml  Net 2634.06 ml   Filed Weights   01/03/21 0630 01/04/21 0500 01/05/21 0247  Weight: 96.7 kg 98.2 kg 99.8 kg    Examination: General:no acute distress, intubated  HEENT: [place]/AT, moist mucous membranes, sclera anicteric, ET tube in place  Neuro:awakes to verbal stimuli CV: irregularly irregular, s1s2, no murmurs  PULM:course breath sounds. No wheezing  GI: soft, non-tender, non-distended, BS+  Extremities: warm, no edema  Skin: multiple wounds covered in bandages   Labs/imaging that I have personally reviewed  (right click and "Reselect all SmartList Selections" daily)  VBG 7.231, pCO2 51 BMP - Glucose 252, Mg 1.7, Cr 3.29  Resolved Hospital Problem list     Assessment & Plan:  [name] is a 60 y.o. F w/ pmhx of COPD (2LNC), HTN, systolic HF, afib depression, DM II, ESRD (Tu,Th,S dialysis), known wounds to her lower extremities, groin, glutes, and coccyx due to complicated hospital stay from February to April of this year due to necrotizing fasciitis who was transferred from [facility] with septic shock.  Septic Shock due to soft tissue wounds/infection -Continue Meropenem and Linezolid for 7 day course -Taper stress dose steroids to 100 BID -Trend CBC/fever curve - s/p debridement with Gen Surgery  Acute on Chronic Respiratory Failure with Hypoxia  COPD (2LNC at baseline). 5/9 CT negative for PE but concerning for "crazy paving" with extensive infilitrate of both lungs.  - Continue mechanical ventilation support -Continue Abx as discussed -Follow up sputum culture -Follow up CXR after PICC line placement -scheduled duonebs  Acute Metabolic Encephalopathy Suspect secondary to sepsis. 5/9 head ct negative. -Goal MAP 65 as discussed -Delirium  prevention precautions. Promote normal sleep/wake cycle. Avoid restraints as able. -Minimize sedating medications - check ammonia level, was on home lactulose  Left medial thigh/Perineum abscess s/p debridement 5/10 and 5/13 Per [facility] CT on 5/9. Surgery performed I&D 5/10 early AM with copious purulent discahrge and potential communication with left medial leg. History of necrotizing fasciitis. Wound cultures previously grew morganella, e.coli, MRSA.  -General surgery consulted; wound debridement in OR on 5/13 -Continue dressing changes -Antibiotics as discussed above -ID consulted; recommend continuing current antibiotic regimen  Wounds- BL lower extremities, L groin Stage 4 PI- RT glute and coccyx Per chart review was previously receiving dakins treatment to PI -Wound care consulted and following  Hx Afib Hx Systolic Heart Failure -Will continue amiodarone  -Continue telemetry  Hx of HTN Hypotensive currently -Resume home meds when appropriate  Anemia of Chronic Disease (Baseline 8-9) -Transfuse pRBC if Hgb <7 -Trend CBC  ESRD (Tu,Th,S dialysis) -Nephrology consulted; plan for HD tomorrow -Trend CMP, MG, Phos  Moderate Malnutrition related to Chronic Illness -Nutrition following -Cortrak in place Nutrition recommend: - Vital 1.5 @ 60 ml/hr (1440 ml/day) - ProSource TF 45 ml QID  DM II -Continue SSI -Blood Glucose goal 140-180  Depression -Resume home antidepressent  Pelvic dermoid/teratoma Per [place] CT on 5/9 -Follow up  with PCP  Best practice (right click and "Reselect all SmartList Selections" daily)  Diet:  Tube Feed  Pain/Anxiety/Delirium protocol (if indicated): Yes (RASS goal 0) VAP protocol (if indicated): Yes DVT prophylaxis: Subcutaneous Heparin GI prophylaxis: PPI Glucose control:  SSI Yes and Basal insulin Yes Central venous access:  Yes, and it is still needed Arterial line:  N/A Foley:  N/A Mobility:  bed rest  PT consulted:  Yes Last date of multidisciplinary goals of care discussion  Code Status:  full code Disposition: ICU  Labs   CBC: Recent Labs  Lab 01/01/21 2039 01/02/21 0130 01/03/21 0607 01/04/21 0135 01/05/21 0240  WBC 16.2* 15.4* 14.1* 10.7* 9.5  NEUTROABS 15.2*  --   --   --   --   HGB 8.8* 8.7* 9.3* 8.4* 9.6*  HCT 29.3* 29.3* 31.5* 28.1* 31.5*  MCV 94.5 94.5 94.6 92.1 91.6  PLT 158 140* 179 123* 88*    Basic Metabolic Panel: Recent Labs  Lab 01/01/21 2039 01/02/21 0130 01/03/21 0607 01/03/21 2248 01/04/21 0135 01/05/21 0701  NA 133* 133* 133* 132* 134* 132*  K 5.6* 5.2* 5.4* 3.5 3.8 4.0  CL 98 99 98 96* 98 98  CO2 20* 21* 19* 25 26 17*  GLUCOSE 162* 177* 160* 144* 110* 252*  BUN 55* 56* 63* 26* 27* 35*  CREATININE 4.18* 4.32* 4.65* 2.41* 2.66* 3.29*  CALCIUM 7.9* 8.0* 7.8* 7.7* 7.7* 7.3*  MG 1.9 1.9  --  1.9  --  1.7  PHOS 6.1* 6.5*  --   --   --  5.0*   GFR: Estimated Creatinine Clearance: 22.5 mL/min (A) (by C-G formula based on SCr of 3.29 mg/dL (H)). Recent Labs  Lab 01/01/21 2039 01/02/21 0130 01/02/21 0853 01/03/21 0607 01/04/21 0135 01/05/21 0240  PROCALCITON 1.32  --   --   --   --   --   WBC 16.2* 15.4*  --  14.1* 10.7* 9.5  LATICACIDVEN 1.5  --  2.1*  --   --   --     Liver Function Tests: Recent Labs  Lab 01/01/21 2039  AST 16  ALT 7  ALKPHOS 198*  BILITOT 0.7  PROT 6.2*  ALBUMIN 2.3*   No results for input(s): LIPASE, AMYLASE in the last 168 hours. No results for input(s): AMMONIA in the last 168 hours.  ABG    Component Value Date/Time   PHART 7.274 (L) 11/29/2020 0905   PCO2ART 51.5 (H) 11/29/2020 0905   PO2ART 68.5 (L) 11/29/2020 0905   HCO3 20.6 01/05/2021 0701   ACIDBASEDEF 5.7 (H) 01/05/2021 0701   O2SAT 52.2 01/05/2021 0701     Coagulation Profile: No results for input(s): INR, PROTIME in the last 168 hours.  Cardiac Enzymes: No results for input(s): CKTOTAL, CKMB, CKMBINDEX, TROPONINI in the last 168  hours.  HbA1C: Hgb A1c MFr Bld  Date/Time Value Ref Range Status  01/02/2021 01:30 AM 5.8 (H) 4.8 - 5.6 % Final    Comment:    (NOTE) Pre diabetes:          5.7%-6.4%  Diabetes:              >6.4%  Glycemic control for   <7.0% adults with diabetes   10/19/2020 03:58 AM 6.8 (H) 4.8 - 5.6 % Final    Comment:    (NOTE) Pre diabetes:          5.7%-6.4%  Diabetes:              >  6.4%  Glycemic control for   <7.0% adults with diabetes     CBG: Recent Labs  Lab 01/04/21 1553 01/04/21 1941 01/04/21 2328 01/05/21 0411 01/05/21 0826  GLUCAP 146* 181* 266* 248* 244*      Critical care time: 50 minutes    Freda Jackson, MD [facility] Office: [phone]   See Amion for personal pager PCCM on call pager [phone] until 7pm. Please call Elink 7p-7a. [phone]

## 2021-01-05 NOTE — Progress Notes (Signed)
Peripherally Inserted Central Catheter Placement  The IV Nurse has discussed with the patient and/or persons authorized to consent for the patient, the purpose of this procedure and the potential benefits and risks involved with this procedure.  The benefits include less needle sticks, lab draws from the catheter, and the patient may be discharged home with the catheter. Risks include, but not limited to, infection, bleeding, blood clot (thrombus formation), and puncture of an artery; nerve damage and irregular heartbeat and possibility to perform a PICC exchange if needed/ordered by physician.  Alternatives to this procedure were also discussed.  Bard Power PICC patient education guide, fact sheet on infection prevention and patient information card has been provided to patient /or left at bedside.    Primary RN obtained consent to place picc  PICC Placement Documentation  PICC Triple Lumen 94/07/68 PICC Left Basilic 48 cm 0 cm (Active)  Indication for Insertion or Continuance of Line Prolonged intravenous therapies 01/05/21 1300  Exposed Catheter (cm) 0 cm 01/05/21 1300  Site Assessment Clean;Dry;Intact 01/05/21 1300  Lumen #1 Status Flushed;Saline locked;Blood return noted 01/05/21 1300  Lumen #2 Status Flushed;Saline locked;Blood return noted 01/05/21 1300  Lumen #3 Status Flushed;Saline locked;Blood return noted 01/05/21 1300  Dressing Type Transparent;Securing device 01/05/21 1300  Dressing Status Clean;Dry;Intact 01/05/21 1300  Antimicrobial disc in place? Yes 01/05/21 1300  Safety Lock Not Applicable 08/81/10 3159  Line Care Connections checked and tightened 01/05/21 1300  Dressing Intervention New dressing 01/05/21 1300  Dressing Change Due 01/12/21 01/05/21 [place], [place] 01/05/2021, 1:12 PM

## 2021-01-05 NOTE — Progress Notes (Signed)
[facility] Progress Note  1 Day Post-Op  Subjective: CC:  Intubated, sedated, on 12.5 mcg neo with systolic pressures in the 80's  Objective: Vital signs in last 24 hours: Temp:  [96.1 F (35.6 C)-98.9 F (37.2 C)] 98.5 F (36.9 C) (05/14 0800) Pulse Rate:  [86-143] 96 (05/14 0800) Resp:  [0-28] 16 (05/14 0804) BP: (77-145)/(55-114) 119/95 (05/14 0800) SpO2:  [89 %-100 %] 99 % (05/14 0804) FiO2 (%):  [40 %-100 %] 40 % (05/14 0804) Weight:  [99.8 kg] 99.8 kg (05/14 0247) Last BM Date: 01/05/21  Intake/Output from previous day: 05/13 0701 - 05/14 0700 In: 2375 [I.V.:1295.4; NG/GT:379.7; IV Piggyback:700] Out: 50 [Blood:50] Intake/Output this shift: No intake/output data recorded.  PE: Gen:  Alert, NAD, pleasant Card:  Regular rate and rhythm Pulm:  Ventilated respirations Abd: Soft, non-tender, non-distended, groin dressings in tact, some SS Drainage on L posterior thigh ABD. Skin: warm and dry, no rashes  Psych: unable to assess  Lab Results:  Recent Labs    01/04/21 0135 01/05/21 0240  WBC 10.7* 9.5  HGB 8.4* 9.6*  HCT 28.1* 31.5*  PLT 123* 88*   BMET Recent Labs    01/04/21 0135 01/05/21 0701  NA 134* 132*  K 3.8 4.0  CL 98 98  CO2 26 17*  GLUCOSE 110* 252*  BUN 27* 35*  CREATININE 2.66* 3.29*  CALCIUM 7.7* 7.3*   PT/INR No results for input(s): LABPROT, INR in the last 72 hours. CMP     Component Value Date/Time   NA 132 (L) 01/05/2021 0701   K 4.0 01/05/2021 0701   CL 98 01/05/2021 0701   CO2 17 (L) 01/05/2021 0701   GLUCOSE 252 (H) 01/05/2021 0701   BUN 35 (H) 01/05/2021 0701   CREATININE 3.29 (H) 01/05/2021 0701   CALCIUM 7.3 (L) 01/05/2021 0701   PROT 6.2 (L) 01/01/2021 2039   ALBUMIN 2.3 (L) 01/01/2021 2039   AST 16 01/01/2021 2039   ALT 7 01/01/2021 2039   ALKPHOS 198 (H) 01/01/2021 2039   BILITOT 0.7 01/01/2021 2039   GFRNONAA 16 (L) 01/05/2021 0701   Lipase  No results found for:  LIPASE     Studies/Results: [place] EKG SITE RITE  Result Date: 01/05/2021 If Site Rite image not attached, placement could not be confirmed due to current cardiac rhythm.  Anti-infectives: Anti-infectives (From admission, onward)   Start     Dose/Rate Route Frequency Ordered Stop   01/02/21 1500  meropenem (MERREM) 500 mg in sodium chloride 0.9 % 100 mL IVPB        500 mg 200 mL/hr over 30 Minutes Intravenous Every 24 hours 01/01/21 2010     01/01/21 2200  linezolid (ZYVOX) IVPB 600 mg        600 mg 300 mL/hr over 60 Minutes Intravenous Every 12 hours 01/01/21 2010       Assessment/Plan MMP   Perirectal soft tissue infection S/p bedside I&D 5/10 Dr. Kae Heller  S/P I&D in OR 5/13 Dr. Marlou Starks - wounds noted to be fairly clean in OR.  - continue BID moist-to-dry dressing changes  - CCS will follow     FEN: NPO, IVF, TF TID  ID: merrem, linezolid  VTE: SQH    LOS: 4 days    Obie Dredge, [facility] Please see Amion for pager number during day hours 7:00am-4:30pm

## 2021-01-05 NOTE — Progress Notes (Signed)
eLink Physician-Brief Progress Note Patient Name: Theresa Russell DOB: July 11, 1961 MRN: [id number]   Date of Service  01/05/2021  HPI/Events of Note  Intubated and ventilated. Notified of need for stress ulcer prophylaxis.   eICU Interventions  Will order Protonix IV.     Intervention Category Intermediate Interventions: Best-practice therapies (e.g. DVT, beta blocker, etc.)  Chamari Cutbirth [place] 01/05/2021, 3:20 AM

## 2021-01-06 DIAGNOSIS — A419 Sepsis, unspecified organism: Secondary | ICD-10-CM | POA: Diagnosis not present

## 2021-01-06 DIAGNOSIS — R6521 Severe sepsis with septic shock: Secondary | ICD-10-CM | POA: Diagnosis not present

## 2021-01-06 LAB — CBC
HCT: 25.2 % — ABNORMAL LOW (ref 36.0–46.0)
Hemoglobin: 7.8 g/dL — ABNORMAL LOW (ref 12.0–15.0)
MCH: 28.6 pg (ref 26.0–34.0)
MCHC: 31 g/dL (ref 30.0–36.0)
MCV: 92.3 fL (ref 80.0–100.0)
Platelets: 86 10*3/uL — ABNORMAL LOW (ref 150–400)
RBC: 2.73 MIL/uL — ABNORMAL LOW (ref 3.87–5.11)
RDW: 17.2 % — ABNORMAL HIGH (ref 11.5–15.5)
WBC: 8.3 10*3/uL (ref 4.0–10.5)
nRBC: 1.6 % — ABNORMAL HIGH (ref 0.0–0.2)

## 2021-01-06 LAB — GLUCOSE, CAPILLARY
Glucose-Capillary: 300 mg/dL — ABNORMAL HIGH (ref 70–99)
Glucose-Capillary: 278 mg/dL — ABNORMAL HIGH (ref 70–99)
Glucose-Capillary: 269 mg/dL — ABNORMAL HIGH (ref 70–99)
Glucose-Capillary: 226 mg/dL — ABNORMAL HIGH (ref 70–99)
Glucose-Capillary: 155 mg/dL — ABNORMAL HIGH (ref 70–99)
Glucose-Capillary: 184 mg/dL — ABNORMAL HIGH (ref 70–99)
Glucose-Capillary: 137 mg/dL — ABNORMAL HIGH (ref 70–99)
Glucose-Capillary: 125 mg/dL — ABNORMAL HIGH (ref 70–99)
Glucose-Capillary: 178 mg/dL — ABNORMAL HIGH (ref 70–99)
Glucose-Capillary: 207 mg/dL — ABNORMAL HIGH (ref 70–99)
Glucose-Capillary: 246 mg/dL — ABNORMAL HIGH (ref 70–99)
Glucose-Capillary: 246 mg/dL — ABNORMAL HIGH (ref 70–99)

## 2021-01-06 LAB — CULTURE, BLOOD (ROUTINE X 2)
Culture: NO GROWTH
Culture: NO GROWTH
Special Requests: ADEQUATE
Special Requests: ADEQUATE

## 2021-01-06 LAB — MAGNESIUM: Magnesium: 2.1 mg/dL (ref 1.7–2.4)

## 2021-01-06 LAB — BASIC METABOLIC PANEL
Anion gap: 14 (ref 5–15)
BUN: 39 mg/dL — ABNORMAL HIGH (ref 6–20)
CO2: 20 mmol/L — ABNORMAL LOW (ref 22–32)
Calcium: 7.4 mg/dL — ABNORMAL LOW (ref 8.9–10.3)
Chloride: 98 mmol/L (ref 98–111)
Creatinine, Ser: 3.32 mg/dL — ABNORMAL HIGH (ref 0.44–1.00)
GFR, Estimated: 16 mL/min — ABNORMAL LOW (ref >60)
Glucose, Bld: 355 mg/dL — ABNORMAL HIGH (ref 70–99)
Potassium: 3.3 mmol/L — ABNORMAL LOW (ref 3.5–5.1)
Sodium: 132 mmol/L — ABNORMAL LOW (ref 135–145)

## 2021-01-06 LAB — PHOSPHORUS: Phosphorus: 5.3 mg/dL — ABNORMAL HIGH (ref 2.5–4.6)

## 2021-01-06 MED ORDER — INSULIN REGULAR(HUMAN) IN NACL 100-0.9 UT/100ML-% IV SOLN
INTRAVENOUS | Status: DC
  Administered 2021-01-06: 12 [IU]/h via INTRAVENOUS
  Filled 2021-01-06: qty 100

## 2021-01-06 MED ORDER — PREDNISONE 10 MG PO TABS
10.0000 mg | ORAL_TABLET | Freq: Every day | ORAL | Status: DC
  Administered 2021-01-07 – 2021-01-09 (×3): 10 mg
  Filled 2021-01-06 (×3): qty 1

## 2021-01-06 MED ORDER — INSULIN DETEMIR 100 UNIT/ML ~~LOC~~ SOLN
12.0000 [IU] | Freq: Two times a day (BID) | SUBCUTANEOUS | Status: DC
  Administered 2021-01-06: 12 [IU] via SUBCUTANEOUS
  Filled 2021-01-06 (×2): qty 0.12

## 2021-01-06 MED ORDER — CHLORHEXIDINE GLUCONATE CLOTH 2 % EX PADS
6.0000 | MEDICATED_PAD | Freq: Every day | CUTANEOUS | Status: DC
  Administered 2021-01-06 – 2021-01-09 (×4): 6 via TOPICAL

## 2021-01-06 MED ORDER — FENTANYL CITRATE (PF) 100 MCG/2ML IJ SOLN: 25.0000 ug | INTRAMUSCULAR | Status: DC | PRN

## 2021-01-06 MED ORDER — DEXTROSE 50 % IV SOLN: 0.0000 mL | INTRAVENOUS | Status: DC | PRN

## 2021-01-06 MED ORDER — INSULIN ASPART 100 UNIT/ML IJ SOLN
0.0000 [IU] | INTRAMUSCULAR | Status: DC
  Administered 2021-01-06: 7 [IU] via SUBCUTANEOUS
  Administered 2021-01-06: 4 [IU] via SUBCUTANEOUS
  Administered 2021-01-06 (×2): 7 [IU] via SUBCUTANEOUS
  Administered 2021-01-07: 4 [IU] via SUBCUTANEOUS
  Administered 2021-01-07: 7 [IU] via SUBCUTANEOUS
  Administered 2021-01-07: 3 [IU] via SUBCUTANEOUS
  Administered 2021-01-07 – 2021-01-09 (×5): 4 [IU] via SUBCUTANEOUS
  Administered 2021-01-09: 3 [IU] via SUBCUTANEOUS

## 2021-01-06 MED ORDER — INSULIN ASPART 100 UNIT/ML IJ SOLN
4.0000 [IU] | Freq: Once | INTRAMUSCULAR | Status: AC
  Administered 2021-01-06: 4 [IU] via SUBCUTANEOUS

## 2021-01-06 MED ORDER — INSULIN ASPART 100 UNIT/ML IJ SOLN
3.0000 [IU] | INTRAMUSCULAR | Status: DC
Start: 2021-01-06 — End: 2021-01-06

## 2021-01-06 MED ORDER — INSULIN GLARGINE 100 UNIT/ML ~~LOC~~ SOLN
12.0000 [IU] | Freq: Two times a day (BID) | SUBCUTANEOUS | Status: DC
  Administered 2021-01-06 – 2021-01-09 (×6): 12 [IU] via SUBCUTANEOUS
  Filled 2021-01-06 (×8): qty 0.12

## 2021-01-06 MED ORDER — OXYCODONE HCL 5 MG/5ML PO SOLN
5.0000 mg | ORAL | Status: DC | PRN
  Administered 2021-01-06 – 2021-01-07 (×4): 5 mg
  Filled 2021-01-06 (×4): qty 5

## 2021-01-06 NOTE — Progress Notes (Signed)
[facility] Progress Note  2 Days Post-Op  Subjective: CC:  Intubated, sedated. Back on 12.5 mcg levo. Responding to pain only but she is on some sedation.  Objective: Vital signs in last 24 hours: Temp:  [97.5 F (36.4 C)-98.5 F (36.9 C)] 98 F (36.7 C) (05/15 0300) Pulse Rate:  [86-220] 95 (05/15 0630) Resp:  [0-26] 17 (05/15 0630) BP: (87-145)/(10-115) 110/71 (05/15 0630) SpO2:  [40 %-100 %] 94 % (05/15 0630) FiO2 (%):  [40 %] 40 % (05/15 0434) Weight:  [99.8 kg] 99.8 kg (05/15 0258) Last BM Date: 01/06/21  Intake/Output from previous day: 05/14 0701 - 05/15 0700 In: 3177.9 [I.V.:1279.4; NG/GT:1155; IV Piggyback:743.5] Out: 200 [Stool:200] Intake/Output this shift: No intake/output data recorded.  PE: Gen:  Alert, NAD, pleasant Card:  Regular rate and rhythm Pulm:  Ventilated respirations Abd: Soft, non-tender, non-distended Left thigh/groin with two wounds as below -- proximal would roughly 2-3 cm in diameter - tunnels posteriorly towards the labia. Base is clean. Medial thigh wound >90% granulation tissue, tunnels cephalad towards proximal thigh wound AND posteriorly towards the perineum.   Sacral wound - eschar removed by me, wound base is mostly fibrinous tissue and necrosis - no fluctuance. No purulence. Visible and palpable sacral bone.  Wound of the left gluteal sulcus and perineum with a clean wound base. Tunnels towards the left labia.        Skin: warm and dry, no rashes  Psych: unable to assess  Lab Results:  Recent Labs    01/05/21 0240 01/06/21 0253  WBC 9.5 8.3  HGB 9.6* 7.8*  HCT 31.5* 25.2*  PLT 88* 86*   BMET Recent Labs    01/05/21 0701 01/06/21 0253  NA 132* 132*  K 4.0 3.3*  CL 98 98  CO2 17* 20*  GLUCOSE 252* 355*  BUN 35* 39*  CREATININE 3.29* 3.32*  CALCIUM 7.3* 7.4*   PT/INR No results for input(s): LABPROT, INR in the last 72 hours. CMP     Component Value Date/Time   NA 132 (L) 01/06/2021 0253    K 3.3 (L) 01/06/2021 0253   CL 98 01/06/2021 0253   CO2 20 (L) 01/06/2021 0253   GLUCOSE 355 (H) 01/06/2021 0253   BUN 39 (H) 01/06/2021 0253   CREATININE 3.32 (H) 01/06/2021 0253   CALCIUM 7.4 (L) 01/06/2021 0253   PROT 6.2 (L) 01/01/2021 2039   ALBUMIN 2.3 (L) 01/01/2021 2039   AST 16 01/01/2021 2039   ALT 7 01/01/2021 2039   ALKPHOS 198 (H) 01/01/2021 2039   BILITOT 0.7 01/01/2021 2039   GFRNONAA 16 (L) 01/06/2021 0253   Lipase  No results found for: LIPASE     Studies/Results: DG Chest Port 1 View  Result Date: 01/05/2021 CLINICAL DATA:  PICC line placement. EXAM: PORTABLE CHEST 1 VIEW COMPARISON:  01/02/2021 FINDINGS: Stable appearance of RIGHT-sided dialysis catheter. Endotracheal tube is in place, tip approximately 6.8 centimeters above the carina. Feeding tube has been placed, tip beyond the level of the stomach. A new LEFT-sided PICC line has been placed, tip overlying the level of the LOWER superior vena cava. Heart is enlarged. Perihilar and basilar changes of mild pulmonary edema are similar to previous study. Stable elevation of the RIGHT hemidiaphragm. No pneumothorax. IMPRESSION: Interval placement of LEFT-sided PICC line, feeding tube, and endotracheal tube. Cardiomegaly and mild edema. Electronically Signed   By: Nolon Nations M.D.   On: 01/05/2021 14:03   [place] EKG SITE RITE  Result Date: 01/05/2021  If [employer] not attached, placement could not be confirmed due to current cardiac rhythm.  Anti-infectives: Anti-infectives (From admission, onward)   Start     Dose/Rate Route Frequency Ordered Stop   01/02/21 1500  meropenem (MERREM) 500 mg in sodium chloride 0.9 % 100 mL IVPB        500 mg 200 mL/hr over 30 Minutes Intravenous Every 24 hours 01/01/21 2010 01/08/21 2359   01/01/21 2200  linezolid (ZYVOX) IVPB 600 mg        600 mg 300 mL/hr over 60 Minutes Intravenous Every 12 hours 01/01/21 2010 01/08/21 2359     Assessment/Plan Acute on chronic  respiratory failure with hypoxia, Hx COPD on home O2 Acute metabolic encephalopathy A.fib CHF Anemia of chronic disease ESRD Malnutrition DMII Depression Pelvic dermoid/teratoma  ---per primary team---  Perirectal soft tissue infection S/p bedside I&D 5/10 Dr. Kae Heller  S/P I&D in OR 5/13 Dr. Marlou Starks - wounds noted to be fairly clean in OR.  - continue BID moist-to-dry dressing changes  - PT hydrotherapy to sacral wound. - while the sacral wound does have some necrosis, none of these wounds show evidence of ongoing infection. No need for operative debridement at this time.  - CCS will follow     FEN: NPO, IVF, TF TID  ID: merrem, linezolid  VTE: SQH    LOS: 5 days    Obie Dredge, [facility] Please see Amion for pager number during day hours 7:00am-4:30pm

## 2021-01-06 NOTE — Progress Notes (Signed)
NAME:  Theresa Russell, MRN:  [id number], DOB:  06-04-1961, LOS: 5 ADMISSION DATE:  01/01/2021, CONSULTATION DATE:  5/10 REFERRING MD:  Jerelene Redden CHIEF COMPLAINT:  Sepsis  History of Present Illness:  [name] is a 60 y.o. F who was transferred from [facility] with sepsis.  She has a pertinent past medical history of COPD [facility]), HTN, systolic HF, afib depression, DM II, ESRD (Tu,Th,S dialysis), known wounds to her lower extremities, groin, glutes, and coccyx due to complicated hospital stay from February to April of this year due to necrotizing fasciitis with Morganella, E. Coli, and MRSA and later complicated by klebsiella pneumonia and klebsiella bacteremia. She was treated with a myriad of antibiotics including zosyn, clindamycin, cefepime, Zyvox, doxycycline and meropenem and multiple debridements. She reportedly does not ambulate and was residing in a SNF.  Per chart review and discussion with her son Theresa Dash, [name] was transferred from her SNF to [facility] on 5/9 with concerns of altered mental status. She was found to be hypotensive and hypoxic on assesment. He last dialysis was Saturday where is was reported that 2L was pulled off. She was started on levophed, 3L of IVF was given, cultures were drawn, and she was started on Linezolid and Meropenem. A head CT was completed which was read by outside radiology as negative for acute process. A CT chest, ABD, and pelvis was obtained which was negative for PE but concerning for "crazy paving" with extensive infilitrate of both lungs, air fluid collection of the left anterior perineum and left medial thigh measuring 5.5 cm which was concerning for an abcess, pelvic dermoid/teratoma. She was found to have a leukocytosis of 13.1, lactate of 2.1 which increased to 2.5, and troponin I of 265. During transfer to [facility] on 5/10 she received 261m LR bolus and found to be normotensive, confused and off of pressors at time of  arrival.  PCCM was consulted for admission for sepsis.  Pertinent  Medical History  COPD (2LNC) HTN Depression DM II ESRD (Tu,Th,S dialysis), Afib with RVR Nec fasciitis s/p surgical debridement with MRSA, Morganella and E. Coli infection. Klebsiella bacteremia/pneumonia Chronic systolic heart failure Known wounds to her lower extremities, groin, glutes, and coccyx.  Significant Hospital Events: Including procedures, antibiotic start and stop dates in addition to other pertinent events    [facility]   3/17 Drainage of inner thigh abcess  4/27 RT IJ Temp cath removal, RT IJ  Tunneled HD cath insertion.  5/9 Presented to [place] with AMS. Found to be hypotensive. Head CT  negative for acute process. CT with/without chest, ABD, and pelvis was obtained which was negative for PE but concerning for "crazy paving" with extensive infilitrate of both lungs, air fluid collection of the left anterior perineum and left medial thigh measuring 5.5 cm which was concerning for an abcess, pelvic dermoid/teratoma. Started on Linezolid and Meropenem  5/10 Transferred to [facility] Surgery performed I&D of left medial thigh wound with copious purulent discharge.  5/12 Episode of Afib with RVR to 150s during dialysis, switched from levophed to phenylephrine and amio started.  5/13 OR surgery for debridement  5/14 remained intubated after surgery  Antibiotics Linezolid 5/10>> Meropenem 5/11>>  Interim History / Subjective:   Patient extubated this morning.   Blood sugars elevated with addition of tube feeds yesterday.    Objective   Blood pressure (!) 137/115, pulse (!) 116, temperature 98.2 F (36.8 C), temperature source Oral, resp. rate  18, height _0  (1.676 m), weight 99.8 kg, SpO2 100 %.    Vent Mode: CPAP;PSV FiO2 (%):  [40 %] 40 % Set Rate:  [18 bmp] 18 bmp Vt Set:  [470 mL] 470 mL PEEP:  [5 cmH20] 5 cmH20 Pressure Support:  [5 cmH20] 5  cmH20 Plateau Pressure:  [21 cmH20] 21 cmH20   Intake/Output Summary (Last 24 hours) at 01/06/2021 1012 Last data filed at 01/06/2021 0600 Gross per 24 hour  Intake 2736.79 ml  Output 200 ml  Net 2536.79 ml   Filed Weights   01/04/21 0500 01/05/21 0247 01/06/21 0258  Weight: 98.2 kg 99.8 kg 99.8 kg    Examination: General:no acute distress HEENT: [place]/AT, moist mucous membranes, sclera anicteric Neuro:awake, following commands CV: irregularly irregular, s1s2, no murmurs  PULM:diminished. No wheezing  GI: soft, non-tender, non-distended, BS+  Extremities: warm, no edema  Skin: multiple wounds covered in bandages   Labs/imaging that I have personally reviewed  (right click and "Reselect all SmartList Selections" daily)  5/15 BMP, CBC 5/14 Ammonia 45  Resolved Hospital Problem list   Shock  Assessment & Plan:  [name] is a 60 y.o. F w/ pmhx of COPD (2LNC), HTN, systolic HF, afib depression, DM II, ESRD (Tu,Th,S dialysis), known wounds to her lower extremities, groin, glutes, and coccyx due to complicated hospital stay from February to April of this year due to necrotizing fasciitis who was transferred from [facility] with septic shock.  Sepsis due to soft tissue wounds/infection -Continue Meropenem and Linezolid for 7 day course -Will give last dose of stress dose steroids today and then resume her home prednisone 2m daily - s/p debridement with Gen Surgery 5/13  Acute on Chronic Respiratory Failure with Hypoxia  COPD (2LNC at baseline). 5/9 CT negative for PE but concerning for "crazy paving" with extensive infilitrate of both lungs.  - Extubated 5/15 -Continue Abx as discussed above -scheduled duonebs  Acute Metabolic Encephalopathy Suspect secondary to sepsis. 5/9 head ct negative. - Mental status improved today -Delirium prevention precautions. Promote normal sleep/wake cycle. Avoid restraints as able. -Minimize sedating medications - ammonia  level not elevated  Left medial thigh/Perineum abscess s/p debridement 5/10 and 5/13 Per [place] [place] CT on 5/9. Surgery performed I&D 5/10 early AM with copious purulent discahrge and potential communication with left medial leg. History of necrotizing fasciitis. Wound cultures previously grew morganella, e.coli, MRSA.  -General surgery consulted; wound debridement in OR on 5/13 -Continue dressing changes -Antibiotics as discussed above -ID consulted; recommend continuing current antibiotic regimen  Wounds- BL lower extremities, L groin Stage 4 PI- RT glute and coccyx Per chart review was previously receiving dakins treatment to PI -Wound care consulted and following  Hx Afib Hx Systolic Heart Failure -Will continue amiodarone  -Continue telemetry  Hx of HTN Hypotensive currently -Resume home meds when appropriate  Anemia of Chronic Disease (Baseline 8-9) -Transfuse pRBC if Hgb <7 -Trend CBC  ESRD (Tu,Th,S dialysis) -HD per Nephrology -Trend CMP, MG, Phos  Moderate Malnutrition related to Chronic Illness -Nutrition following -Cortrak in place Nutrition recommend: - Vital 1.5 @ 60 ml/hr (1440 ml/day) - ProSource TF 45 ml QID  DM II -Continue SSI - Lantus 12 units BID -Blood Glucose goal 140-180  Depression -Resume home antidepressent  Pelvic dermoid/teratoma Per [place] CT on 5/9 -Follow up with PCP  Best practice (right click and "Reselect all SmartList Selections" daily)  Diet:  Tube Feed  Pain/Anxiety/Delirium protocol (if indicated): No VAP protocol (if  indicated): Not indicated DVT prophylaxis: Subcutaneous Heparin GI prophylaxis: PPI Glucose control:  SSI Yes and Basal insulin Yes Central venous access:  Yes, and it is still needed Arterial line:  N/A Foley:  N/A Mobility:  bed rest  PT consulted: Yes Last date of multidisciplinary goals of care discussion  Code Status:  full code Disposition: ICU  Labs   CBC: Recent Labs  Lab  01/01/21 2039 01/02/21 0130 01/03/21 0607 01/04/21 0135 01/05/21 0240 01/06/21 0253  WBC 16.2* 15.4* 14.1* 10.7* 9.5 8.3  NEUTROABS 15.2*  --   --   --   --   --   HGB 8.8* 8.7* 9.3* 8.4* 9.6* 7.8*  HCT 29.3* 29.3* 31.5* 28.1* 31.5* 25.2*  MCV 94.5 94.5 94.6 92.1 91.6 92.3  PLT 158 140* 179 123* 88* 86*    Basic Metabolic Panel: Recent Labs  Lab 01/01/21 2039 01/02/21 0130 01/03/21 0607 01/03/21 2248 01/04/21 0135 01/05/21 0701 01/06/21 0253  NA 133* 133* 133* 132* 134* 132* 132*  K 5.6* 5.2* 5.4* 3.5 3.8 4.0 3.3*  CL 98 99 98 96* 98 98 98  CO2 20* 21* 19* 25 26 17* 20*  GLUCOSE 162* 177* 160* 144* 110* 252* 355*  BUN 55* 56* 63* 26* 27* 35* 39*  CREATININE 4.18* 4.32* 4.65* 2.41* 2.66* 3.29* 3.32*  CALCIUM 7.9* 8.0* 7.8* 7.7* 7.7* 7.3* 7.4*  MG 1.9 1.9  --  1.9  --  1.7 2.1  PHOS 6.1* 6.5*  --   --   --  5.0* 5.3*   GFR: Estimated Creatinine Clearance: 22.3 mL/min (A) (by C-G formula based on SCr of 3.32 mg/dL (H)). Recent Labs  Lab 01/01/21 2039 01/02/21 0130 01/02/21 0853 01/03/21 0607 01/04/21 0135 01/05/21 0240 01/06/21 0253  PROCALCITON 1.32  --   --   --   --   --   --   WBC 16.2*   < >  --  14.1* 10.7* 9.5 8.3  LATICACIDVEN 1.5  --  2.1*  --   --   --   --    < > = values in this interval not displayed.    Liver Function Tests: Recent Labs  Lab 01/01/21 2039  AST 16  ALT 7  ALKPHOS 198*  BILITOT 0.7  PROT 6.2*  ALBUMIN 2.3*   No results for input(s): LIPASE, AMYLASE in the last 168 hours. Recent Labs  Lab 01/05/21 1600  AMMONIA 45*    ABG    Component Value Date/Time   PHART 7.274 (L) 11/29/2020 0905   PCO2ART 51.5 (H) 11/29/2020 0905   PO2ART 68.5 (L) 11/29/2020 0905   HCO3 20.6 01/05/2021 0701   ACIDBASEDEF 5.7 (H) 01/05/2021 0701   O2SAT 52.2 01/05/2021 0701     Coagulation Profile: No results for input(s): INR, PROTIME in the last 168 hours.  Cardiac Enzymes: No results for input(s): CKTOTAL, CKMB, CKMBINDEX, TROPONINI  in the last 168 hours.  HbA1C: Hgb A1c MFr Bld  Date/Time Value Ref Range Status  01/02/2021 01:30 AM 5.8 (H) 4.8 - 5.6 % Final    Comment:    (NOTE) Pre diabetes:          5.7%-6.4%  Diabetes:              >6.4%  Glycemic control for   <7.0% adults with diabetes   10/19/2020 03:58 AM 6.8 (H) 4.8 - 5.6 % Final    Comment:    (NOTE) Pre diabetes:  5.7%-6.4%  Diabetes:              >6.4%  Glycemic control for   <7.0% adults with diabetes     CBG: Recent Labs  Lab 01/06/21 0438 01/06/21 0534 01/06/21 0627 01/06/21 0730 01/06/21 0834  GLUCAP 226* 184* 155* 137* 125*      Critical care time: 40 minutes    Freda Jackson, MD [facility] Office: [phone]   See Amion for personal pager PCCM on call pager [phone] until 7pm. Please call Elink 7p-7a. [phone]

## 2021-01-06 NOTE — Progress Notes (Signed)
Subjective:  Had PICC placed-  Remains intubated-  Pressors had to be restarted.  I was in while changing dressings-  Very deep wounds.  Labs do not look that bad in terms of need for dialysis   Objective Vital signs in last 24 hours: Vitals:   01/06/21 0545 01/06/21 0600 01/06/21 0615 01/06/21 0630  BP: (!) 91/59 90/65 (!) 87/64 110/71  Pulse: (!) 102 (!) 102 (!) 101 95  Resp: _0 Temp:      TempSrc:      SpO2: 96% 100% 97% 94%  Weight:      Height:       Weight change: 0 kg  Intake/Output Summary (Last 24 hours) at 01/06/2021 0809 Last data filed at 01/06/2021 0600 Gross per 24 hour  Intake 3044.69 ml  Output 200 ml  Net 2844.69 ml   Dialyzes at [place]  TTS 4 hours  EDW 93 kg. HD Bath 2/2.5, Dialyzer unknown, Heparin yes- 2000 per hour. Access TDC.BFR 350/dfr 500 epogen 8000 per tx, iron weekly    Assessment/Plan: 60 year old BF with multiple medical issues including ESRD and chronic wounds.  She now has what appears to be sepsis in the setting of these wounds requiring pressor support  1 wounds/sepsis-  S/p a brief debridement per surgery upon admission-  s/p more extensive debridement 5/13-  Also meropenam and zyvox.  These wounds are significant-  Surgery having doubts that these can even heal 2 ESRD: normally TTS at [facility] via [facility]- had HD last on 5/12.   Was going to do IHD today but is too unstable and again no acute needs-  Would prefer to avoid CRRT if can-  Will give another 24 hours to see if stablilizes but if not may need CRRT on 5/16.  I will put in orders for IHD tomorrow hoping she will be able to tolerate 3 Hypertension: hypotensive likely due to sepsis-  Volume seems generous based on edema but also likely third spacing due to hypoalbuminemia.  No volume removal with HD on 5/12-  Will need some UF at some point  4. Anemia of ESRD: chronic issue, exacerbated with surgery-  Will cont ESA-  No iron due to infections 5. Metabolic Bone  Disease: does not appear to be on vitamin D-  Unclear if on binders.  Phos OK -     Raimundo Corbit A Arienne Gartin    Labs: Basic Metabolic Panel: Recent Labs  Lab 01/02/21 0130 01/03/21 0607 01/04/21 0135 01/05/21 0701 01/06/21 0253  NA 133*   < > 134* 132* 132*  K 5.2*   < > 3.8 4.0 3.3*  CL 99   < > 98 98 98  CO2 21*   < > 26 17* 20*  GLUCOSE 177*   < > 110* 252* 355*  BUN 56*   < > 27* 35* 39*  CREATININE 4.32*   < > 2.66* 3.29* 3.32*  CALCIUM 8.0*   < > 7.7* 7.3* 7.4*  PHOS 6.5*  --   --  5.0* 5.3*   < > = values in this interval not displayed.   Liver Function Tests: Recent Labs  Lab 01/01/21 2039  AST 16  ALT 7  ALKPHOS 198*  BILITOT 0.7  PROT 6.2*  ALBUMIN 2.3*   No results for input(s): LIPASE, AMYLASE in the last 168 hours. Recent Labs  Lab 01/05/21 1600  AMMONIA 45*   CBC: Recent Labs  Lab 01/01/21 2039 01/02/21 0130 01/03/21  2217 01/04/21 0135 01/05/21 0240 01/06/21 0253  WBC 16.2* 15.4* 14.1* 10.7* 9.5 8.3  NEUTROABS 15.2*  --   --   --   --   --   HGB 8.8* 8.7* 9.3* 8.4* 9.6* 7.8*  HCT 29.3* 29.3* 31.5* 28.1* 31.5* 25.2*  MCV 94.5 94.5 94.6 92.1 91.6 92.3  PLT 158 140* 179 123* 88* 86*   Cardiac Enzymes: No results for input(s): CKTOTAL, CKMB, CKMBINDEX, TROPONINI in the last 168 hours. CBG: Recent Labs  Lab 01/06/21 0342 01/06/21 0438 01/06/21 0534 01/06/21 0627 01/06/21 0730  GLUCAP 269* 226* 184* 155* 137*    Iron Studies:  Recent Labs    01/04/21 0135  IRON 13*  TIBC UNABLE TO CALCULATE.  FERRITIN 420*   Studies/Results: DG Chest Port 1 View  Result Date: 01/05/2021 CLINICAL DATA:  PICC line placement. EXAM: PORTABLE CHEST 1 VIEW COMPARISON:  01/02/2021 FINDINGS: Stable appearance of RIGHT-sided dialysis catheter. Endotracheal tube is in place, tip approximately 6.8 centimeters above the carina. Feeding tube has been placed, tip beyond the level of the stomach. A new LEFT-sided PICC line has been placed, tip overlying the  level of the LOWER superior vena cava. Heart is enlarged. Perihilar and basilar changes of mild pulmonary edema are similar to previous study. Stable elevation of the RIGHT hemidiaphragm. No pneumothorax. IMPRESSION: Interval placement of LEFT-sided PICC line, feeding tube, and endotracheal tube. Cardiomegaly and mild edema. Electronically Signed   By: Nolon Nations M.D.   On: 01/05/2021 14:03   [place] EKG SITE RITE  Result Date: 01/05/2021 If Site Rite image not attached, placement could not be confirmed due to current cardiac rhythm.  Medications: Infusions: . sodium chloride 10 mL/hr at 01/05/21 2000  . amiodarone 30 mg/hr (01/06/21 0600)  . dexmedetomidine (PRECEDEX) IV infusion 1 mcg/kg/hr (01/06/21 0725)  . feeding supplement (VITAL 1.5 CAL) 1,000 mL (01/05/21 1804)  . insulin 5.5 mL/hr at 01/06/21 0600  . linezolid (ZYVOX) IV Stopped (01/05/21 2211)  . meropenem (MERREM) IV Stopped (01/05/21 1646)    Scheduled Medications: . chlorhexidine gluconate (MEDLINE KIT)  15 mL Mouth Rinse BID  . Chlorhexidine Gluconate Cloth  6 each Topical Q0600  . [START ON 01/10/2021] darbepoetin (ARANESP) injection - DIALYSIS  100 mcg Intravenous Q Thu-HD  . feeding supplement (PROSource TF)  90 mL Per Tube TID  . heparin  5,000 Units Subcutaneous Q8H  . hydrocortisone sod succinate (SOLU-CORTEF) inj  100 mg Intravenous Q12H  . insulin glargine  12 Units Subcutaneous BID  . ipratropium-albuterol  3 mL Nebulization Q6H  . mouth rinse  15 mL Mouth Rinse 10 times per day  . multivitamin  1 tablet Per Tube QHS  . pantoprazole sodium  40 mg Per Tube QHS    have reviewed scheduled and prn medications.  Physical Exam: General:  Intubated and sedated Heart: tachy Lungs: dec BS at bases Abdomen: soft, non tender Extremities: some pitting edema-  Wounds on LE esp medial left thigh-  Very deep but clean Dialysis Access: right sided TDC-  No obvious infection    01/06/2021,8:09 AM  LOS: 5 days

## 2021-01-06 NOTE — Progress Notes (Signed)
Pharmacy Antibiotic Note  Theresa Russell is a 60 y.o. female admitted on 01/01/2021 from OSH hypotension and hypoxia. Pharmacy has been consulted for meropenem and linezolid dosing which was started at OSH.   Pt has ESRD on HD TTS - Last HD 5/12. Too unstable for HD today and attempting to avoid CRRT. Plan to try again for HD on Monday* off schedule.  CT with PNA and abscess. Cultures still pending.   Plan: Continue Linezolid 600mg  every 12h - stop date in place.  Continue low dose Meropenem at 500mg  every 24h -stop date in place.    Height: 5\' 6"  (167.6 cm) Weight: 99.8 kg (220 lb 0.3 oz) IBW/kg (Calculated) : 59.3  Temp (24hrs), Avg:97.9 F (36.6 C), Min:97.5 F (36.4 C), Max:98.2 F (36.8 C)  Recent Labs  Lab 01/01/21 2039 01/02/21 0130 01/02/21 0853 01/03/21 0607 01/03/21 2248 01/04/21 0135 01/05/21 0240 01/05/21 0701 01/06/21 0253  WBC 16.2* 15.4*  --  14.1*  --  10.7* 9.5  --  8.3  CREATININE 4.18* 4.32*  --  4.65* 2.41* 2.66*  --  3.29* 3.32*  LATICACIDVEN 1.5  --  2.1*  --   --   --   --   --   --     Estimated Creatinine Clearance: 22.3 mL/min (A) (by C-G formula based on SCr of 3.32 mg/dL (H)).    Allergies  Allergen Reactions  . Atorvastatin Other (See Comments)    Unknown reaction  . Canagliflozin Other (See Comments)    Unknown   . Levofloxacin Other (See Comments)    Unknown  . Lisinopril Other (See Comments)    Unknown  . Other     Orange dye   . Vancomycin Other (See Comments)    Unknown  . Hydrochlorothiazide Nausea Only    Sloan Leiter, PharmD, BCPS, BCCCP Clinical Pharmacist Please refer to [facility] for [facility] Pharmacy numbers 01/06/2021

## 2021-01-06 NOTE — Anesthesia Postprocedure Evaluation (Signed)
Anesthesia Post Note  Patient: Theresa Russell  Procedure(s) Performed: INCISION AND DRAINAGE OF PERIRECTAL ABSCESS (N/A )     Patient location during evaluation: ICU Anesthesia Type: General Level of consciousness: patient remains intubated per anesthesia plan Pain management: pain level controlled Vital Signs Assessment: post-procedure vital signs reviewed and stable Respiratory status: patient remains intubated per anesthesia plan Cardiovascular status: blood pressure returned to baseline and stable Postop Assessment: no apparent nausea or vomiting Anesthetic complications: no   No complications documented.  Last Vitals:  Vitals:   01/06/21 0615 01/06/21 0630  BP: (!) 87/64 110/71  Pulse: (!) 101 95  Resp: 16 17  Temp:    SpO2: 97% 94%    Last Pain:  Vitals:   01/06/21 0300  TempSrc: Oral                 Delrico Minehart L Tyrena Gohr

## 2021-01-06 NOTE — Progress Notes (Addendum)
eLink Physician-Brief Progress Note Patient Name: Theresa Russell DOB: 06-01-1961 MRN: [id number]   Date of Service  01/06/2021  HPI/Events of Note  Hyperglycemia. POC glucose 319 (and increasing) while on moderate ISS + glargine 8u Morgan daily.  Patient is now on full strength tube feeding and is also receiving stress dose steroids.   eICU Interventions  Patient will receive a total of 15u aspart Morral for this CBG value. RN to spot check CBG at 0200 hrs.  Increased lantus to 12u BID.  Increased intensity of ISS to resistant scale.   ADDENDUM 01/06/21 2:23 AM  - CBG 300 (minimal change from 319). - Discontinued ISS. Ordered insulin drip protocol.  Intervention Category Intermediate Interventions: Hyperglycemia - evaluation and treatment  Charlott Rakes 01/06/2021, 12:12 AM

## 2021-01-06 NOTE — Procedures (Signed)
Extubation Procedure Note  Patient Details:   Name: Theresa Russell DOB: [birth year] MRN: [id number]   Airway Documentation:    Vent end date: 01/06/21 Vent end time: 0932   Evaluation  O2 sats: stable throughout Complications: No apparent complications Patient did tolerate procedure well. Bilateral Breath Sounds: Clear,Diminished   Yes   Patient was extubated to a 4L [place] without any complications, dyspnea or stridor noted. Positive cuff leak prior to extubation.   Claretta Fraise 01/06/2021, 9:32 AM

## 2021-01-07 ENCOUNTER — Encounter (HOSPITAL_COMMUNITY): Payer: Self-pay | Admitting: General Surgery

## 2021-01-07 DIAGNOSIS — A419 Sepsis, unspecified organism: Secondary | ICD-10-CM | POA: Diagnosis not present

## 2021-01-07 DIAGNOSIS — L02419 Cutaneous abscess of limb, unspecified: Secondary | ICD-10-CM | POA: Diagnosis not present

## 2021-01-07 DIAGNOSIS — L89309 Pressure ulcer of unspecified buttock, unspecified stage: Secondary | ICD-10-CM | POA: Diagnosis not present

## 2021-01-07 DIAGNOSIS — N186 End stage renal disease: Secondary | ICD-10-CM | POA: Diagnosis not present

## 2021-01-07 DIAGNOSIS — R6521 Severe sepsis with septic shock: Secondary | ICD-10-CM | POA: Diagnosis not present

## 2021-01-07 LAB — GLUCOSE, CAPILLARY
Glucose-Capillary: 100 mg/dL — ABNORMAL HIGH (ref 70–99)
Glucose-Capillary: 126 mg/dL — ABNORMAL HIGH (ref 70–99)
Glucose-Capillary: 175 mg/dL — ABNORMAL HIGH (ref 70–99)
Glucose-Capillary: 183 mg/dL — ABNORMAL HIGH (ref 70–99)
Glucose-Capillary: 196 mg/dL — ABNORMAL HIGH (ref 70–99)
Glucose-Capillary: 202 mg/dL — ABNORMAL HIGH (ref 70–99)

## 2021-01-07 LAB — BLOOD GAS, VENOUS
Acid-base deficit: 7.3 mmol/L — ABNORMAL HIGH (ref 0.0–2.0)
Bicarbonate: 20.1 mmol/L (ref 20.0–28.0)
FIO2: 32
O2 Saturation: 63.9 %
Patient temperature: 37
pCO2, Ven: 58.8 mmHg (ref 44.0–60.0)
pH, Ven: 7.159 — CL (ref 7.250–7.430)
pO2, Ven: 45.8 mmHg — ABNORMAL HIGH (ref 32.0–45.0)

## 2021-01-07 LAB — POCT I-STAT 7, (LYTES, BLD GAS, ICA,H+H)
Acid-base deficit: 6 mmol/L — ABNORMAL HIGH (ref 0.0–2.0)
Acid-base deficit: 7 mmol/L — ABNORMAL HIGH (ref 0.0–2.0)
Bicarbonate: 21.6 mmol/L (ref 20.0–28.0)
Bicarbonate: 20.6 mmol/L (ref 20.0–28.0)
Calcium, Ion: 1.14 mmol/L — ABNORMAL LOW (ref 1.15–1.40)
Calcium, Ion: 1.15 mmol/L (ref 1.15–1.40)
HCT: 27 % — ABNORMAL LOW (ref 36.0–46.0)
HCT: 27 % — ABNORMAL LOW (ref 36.0–46.0)
Hemoglobin: 9.2 g/dL — ABNORMAL LOW (ref 12.0–15.0)
Hemoglobin: 9.2 g/dL — ABNORMAL LOW (ref 12.0–15.0)
O2 Saturation: 97 %
O2 Saturation: 96 %
Patient temperature: 97.6
Patient temperature: 97.6
Potassium: 3.7 mmol/L (ref 3.5–5.1)
Potassium: 3.7 mmol/L (ref 3.5–5.1)
Sodium: 130 mmol/L — ABNORMAL LOW (ref 135–145)
Sodium: 130 mmol/L — ABNORMAL LOW (ref 135–145)
TCO2: 23 mmol/L (ref 22–32)
TCO2: 22 mmol/L (ref 22–32)
pCO2 arterial: 52 mmHg — ABNORMAL HIGH (ref 32.0–48.0)
pCO2 arterial: 50.3 mmHg — ABNORMAL HIGH (ref 32.0–48.0)
pH, Arterial: 7.222 — ABNORMAL LOW (ref 7.350–7.450)
pH, Arterial: 7.216 — ABNORMAL LOW (ref 7.350–7.450)
pO2, Arterial: 107 mmHg (ref 83.0–108.0)
pO2, Arterial: 96 mmHg (ref 83.0–108.0)

## 2021-01-07 LAB — CBC
HCT: 25 % — ABNORMAL LOW (ref 36.0–46.0)
Hemoglobin: 7.7 g/dL — ABNORMAL LOW (ref 12.0–15.0)
MCH: 28.4 pg (ref 26.0–34.0)
MCHC: 30.8 g/dL (ref 30.0–36.0)
MCV: 92.3 fL (ref 80.0–100.0)
Platelets: 85 10*3/uL — ABNORMAL LOW (ref 150–400)
RBC: 2.71 MIL/uL — ABNORMAL LOW (ref 3.87–5.11)
RDW: 17.2 % — ABNORMAL HIGH (ref 11.5–15.5)
WBC: 14.4 10*3/uL — ABNORMAL HIGH (ref 4.0–10.5)
nRBC: 1 % — ABNORMAL HIGH (ref 0.0–0.2)

## 2021-01-07 LAB — BASIC METABOLIC PANEL
Anion gap: 12 (ref 5–15)
BUN: 50 mg/dL — ABNORMAL HIGH (ref 6–20)
CO2: 21 mmol/L — ABNORMAL LOW (ref 22–32)
Calcium: 7.6 mg/dL — ABNORMAL LOW (ref 8.9–10.3)
Chloride: 96 mmol/L — ABNORMAL LOW (ref 98–111)
Creatinine, Ser: 3.92 mg/dL — ABNORMAL HIGH (ref 0.44–1.00)
GFR, Estimated: 13 mL/min — ABNORMAL LOW (ref >60)
Glucose, Bld: 258 mg/dL — ABNORMAL HIGH (ref 70–99)
Potassium: 3.5 mmol/L (ref 3.5–5.1)
Sodium: 129 mmol/L — ABNORMAL LOW (ref 135–145)

## 2021-01-07 LAB — AEROBIC/ANAEROBIC CULTURE W GRAM STAIN (SURGICAL/DEEP WOUND)

## 2021-01-07 MED ORDER — PROSOURCE TF PO LIQD
45.0000 mL | Freq: Three times a day (TID) | ORAL | Status: DC
  Administered 2021-01-07 – 2021-01-09 (×6): 45 mL
  Filled 2021-01-07 (×6): qty 45

## 2021-01-07 MED ORDER — VITAL 1.5 CAL PO LIQD
1000.0000 mL | ORAL | Status: DC
  Administered 2021-01-07 – 2021-01-09 (×3): 1000 mL
  Filled 2021-01-07 (×2): qty 1000

## 2021-01-07 MED ORDER — NALOXONE HCL 0.4 MG/ML IJ SOLN
INTRAMUSCULAR | Status: AC
  Filled 2021-01-07: qty 1

## 2021-01-07 MED ORDER — SERTRALINE HCL 50 MG PO TABS
50.0000 mg | ORAL_TABLET | Freq: Every day | ORAL | Status: DC
  Administered 2021-01-07 – 2021-01-09 (×3): 50 mg
  Filled 2021-01-07 (×3): qty 1

## 2021-01-07 MED ORDER — NALOXONE HCL 0.4 MG/ML IJ SOLN
0.2000 mg | Freq: Once | INTRAMUSCULAR | Status: AC
  Administered 2021-01-07: 0.2 mg via INTRAVENOUS

## 2021-01-07 MED ORDER — SILVER NITRATE-POT NITRATE 75-25 % EX MISC
3.0000 | CUTANEOUS | Status: DC | PRN
  Filled 2021-01-07: qty 3

## 2021-01-07 MED ORDER — IPRATROPIUM-ALBUTEROL 0.5-2.5 (3) MG/3ML IN SOLN
3.0000 mL | Freq: Three times a day (TID) | RESPIRATORY_TRACT | Status: DC
  Administered 2021-01-07 – 2021-01-08 (×6): 3 mL via RESPIRATORY_TRACT
  Filled 2021-01-07 (×7): qty 3

## 2021-01-07 NOTE — Progress Notes (Addendum)
eLink Physician-Brief Progress Note Patient Name: HONI NAME DOB: 08-12-1961 MRN: [id number]   Date of Service  01/07/2021  HPI/Events of Note  Patient extremely somnolent, ABG ordered, ground team asked to evaluate patient for possible intubation, BMP and CT head ordered.  eICU Interventions  See above.        Kerry Kass Rickey Sadowski 01/07/2021, 10:40 PM

## 2021-01-07 NOTE — Progress Notes (Signed)
Nutrition Follow-up  DOCUMENTATION CODES:   Obesity unspecified  INTERVENTION:   Continue tube feeding via Cortrak: - Increase Vital 1.5 to 60 ml/hr (1440 ml/day) - ProSource TF 45 ml TID  Tube feeding regimen provides 2280 kcal, 130 grams of protein, and 1100 ml of H2O.   - RD will monitor for diet advancement and adjust TF regimen as appropriate  NUTRITION DIAGNOSIS:   Moderate Malnutrition related to chronic illness (ESRD, COPD, CHF) as evidenced by mild fat depletion,moderate muscle depletion.  Ongoing, being addressed via TF  GOAL:   Patient will meet greater than or equal to 90% of their needs  Met via TF  MONITOR:   Vent status,Labs,Weight trends,TF tolerance,Skin,I & O's  REASON FOR ASSESSMENT:   Consult Enteral/tube feeding initiation and management  ASSESSMENT:   60 year old female who presented to [facility] on 5/10 from [facility]. Pt found to be septic with multiple wounds draining purulent material. PMH of COPD, HTN, CHF, atrial fibrillation, depression, T2DM, ESRD on HD, multiple wounds, necrotizing fasciitis. Pt admitted with sepsis.  5/10 - s/p bedside I&D of L medial thigh abscess 5/13 - s/p I&D of perirectal abscess in OR, Cortrak placed (tip gastric), intubated 5/14 - triple lumen PICC placed 5/15 - extubated  Discussed pt with RN and during ICU rounds. Pt receiving hydrotherapy for wounds. Pt with rectal tube in place for loose stool to keep wounds clean. RD to increase tube feeds to better meet needs now that pt has been extubated. Pt remains NPO at this time.   Per Nephrology note, plan is for HD again today with attempts to achieve some ultrafiltration.  Admit weight: 95.5 kg Current weight: 102 kg  Pt continues to have moderate pitting edema to BUE and deep pitting edema to BLE.  Medications reviewed and include: aranesp, SSI q 4 hours, lantus 12 units BID, rena-vit, protonix, prednisone, amiodarone, IV abx  Labs reviewed: sodium  129, phosphorus 5.3 on 5/15, hemoglobin 7.7 CBG's: 196-246 x 24 hours  Stool: 600 ml x 24 hours via rectal tube I/O's: +12.0 L since admit  Diet Order:   Diet Order            Diet NPO time specified  Diet effective now                 EDUCATION NEEDS:   Not appropriate for education at this time  Skin:  Skin Assessment: Skin Integrity Issues: Stage II: L ischial tuberosity Unstageable: sacrum Incisions: L thigh x 2, perineum Other: pressure injury to R hip; skin tear to groin; multiple non-pressure wounds to feet, ankles, legs  Last BM:  01/07/21 rectal tube  Height:   Ht Readings from Last 1 Encounters:  01/02/21 5' 6"  (1.676 m)    Weight:   Wt Readings from Last 1 Encounters:  01/07/21 102 kg    Ideal Body Weight:  59.1 kg  BMI:  Body mass index is 36.29 kg/m.  Estimated Nutritional Needs:   Kcal:  2200-2400  Protein:  120-140 grams  Fluid:  1000 ml + UOP    Gustavus Bryant, MS, RD, LDN Inpatient Clinical Dietitian Please see AMiON for contact information.

## 2021-01-07 NOTE — Progress Notes (Signed)
Physical Therapy Wound Evaluation and Treatment Patient Details  Name: Theresa Russell MRN: [id number] Date of Birth: March 28, 1961  Today's Date: 01/07/2021 Time: 1031-1208 Time Calculation (min): 97 min  Subjective  Subjective Assessment Subjective: Pt obtunded throughout session however did call out "oh my god" when we rolled to the side at beginning of session. Patient and Family Stated Goals: None stated Date of Onset:  (Unsure) Prior Treatments: Dressing changes  Pain Score:   No premedication 2 obtunded. Pt did not appear to be uncomfortable throughout treatment.   Wound Assessment  Pressure Injury 01/01/21 Sacrum Medial Unstageable - Full thickness tissue loss in which the base of the injury is covered by slough (yellow, tan, gray, green or brown) and/or eschar (tan, brown or black) in the wound bed. (Active)  Wound Image   01/07/21 1223  Dressing Type ABD;Barrier Film (skin prep);Gauze (Comment);Moist to moist 01/07/21 1223  Dressing Changed;Clean;Dry;Intact 01/07/21 1223  Dressing Change Frequency Daily 01/07/21 1223  State of Healing Eschar 01/07/21 1223  Site / Wound Assessment Black;Red;Yellow 01/07/21 1223  % Wound base Red or Granulating 60% 01/07/21 1223  % Wound base Yellow/Fibrinous Exudate 25% 01/07/21 1223  % Wound base Black/Eschar 15% 01/07/21 1223  % Wound base Other/Granulation Tissue (Comment) 0% 01/07/21 1223  Peri-wound Assessment Intact 01/07/21 1223  Wound Length (cm) 10.2 cm 01/07/21 1000  Wound Width (cm) 10.9 cm 01/07/21 1000  Wound Depth (cm) 2.5 cm 01/07/21 1000  Wound Surface Area (cm^2) 111.18 cm^2 01/07/21 1000  Wound Volume (cm^3) 277.95 cm^3 01/07/21 1000  Tunneling (cm) 0 01/07/21 1223  Undermining (cm) Up to 3.5 cm throughout the circumference of the wound 01/07/21 1000  Margins Unattached edges (unapproximated) 01/07/21 1223  Drainage Amount Minimal 01/07/21 1223  Drainage Description Serosanguineous;Green 01/07/21 1223  Treatment  Debridement (Selective);Hydrotherapy (Pulse lavage);Packing (Saline gauze) 01/07/21 1223      Hydrotherapy Pulsed lavage therapy - wound location: Sacrum Pulsed Lavage with Suction (psi): 12 psi Pulsed Lavage with Suction - Normal Saline Used: 1000 mL Pulsed Lavage Tip: Tip with splash shield Selective Debridement Selective Debridement - Location: Sacrum Selective Debridement - Tools Used: Forceps,Scalpel,Scissors Selective Debridement - Tissue Removed: Eschar and yellow nonviable tissue    Wound Assessment and Plan  Wound Therapy - Assess/Plan/Recommendations Wound Therapy - Clinical Statement: Pt presents to hydrotherapy with an open unstagable sacral wound. Debridement able to be initiated however with an area of bleeding noted, requiring silver nitrate to control. This patient will benefit from continued hydrotherapy for selective removal of unviable tissue, to decrease bioburden, and promote wound bed healing. Wound Therapy - Functional Problem List: Global weakness in the setting of almost 3 month span of hospitilizations and SNF stay. Factors Delaying/Impairing Wound Healing: Diabetes Mellitus,Immobility,Multiple medical problems,Infection - systemic/local Hydrotherapy Plan: Debridement,Dressing change,Patient/family education,Pulsatile lavage with suction Wound Therapy - Frequency: 6X / week Wound Therapy - Follow Up Recommendations: dressing changes by RN  Wound Therapy Goals- Improve the function of patient's integumentary system by progressing the wound(s) through the phases of wound healing (inflammation - proliferation - remodeling) by: Wound Therapy Goals - Improve the function of patient's integumentary system by progressing the wound(s) through the phases of wound healing by: Decrease Necrotic Tissue to: 20% Decrease Necrotic Tissue - Progress: Goal set today Increase Granulation Tissue to: 80% Increase Granulation Tissue - Progress: Goal set today Goals/treatment  plan/discharge plan were made with and agreed upon by patient/family: Yes Time For Goal Achievement: 7 days Wound Therapy - Potential for Goals: Good  Goals  will be updated until maximal potential achieved or discharge criteria met.  Discharge criteria: when goals achieved, discharge from hospital, MD decision/surgical intervention, no progress towards goals, refusal/missing three consecutive treatments without notification or medical reason.  GP     Charges PT Wound Care Charges $Wound Debridement up to 20 cm: < or equal to 20 cm $ Wound Debridement each add'l 20 sqcm: 2 $PT Hydrotherapy Dressing: 2 dressings $PT PLS Gun and Tip: 1 Supply $PT Hydrotherapy Visit: 1 Visit       Thelma Comp 01/07/2021, 12:59 PM   Rolinda Roan, PT, DPT Acute Rehabilitation Services Pager: [phone] Office: [phone]

## 2021-01-07 NOTE — Progress Notes (Signed)
[facility] Progress Note Patient Name: Theresa Russell DOB: 02/05/1961 MRN: [id number]   Date of Service  01/07/2021  HPI/Events of Note  AM labs seen.   eICU Interventions  Discussed with RN. Going for HD today. Nephro team to address sodium 129 and K 3.5 during K bath HD.      Intervention Category Intermediate Interventions: Electrolyte abnormality - evaluation and management  Elmer Sow 01/07/2021, 4:06 AM

## 2021-01-07 NOTE — Progress Notes (Signed)
NAME:  Theresa Russell, MRN:  [id number], DOB:  [birth year], LOS: 6 ADMISSION DATE:  01/01/2021, CONSULTATION DATE:  5/10 REFERRING MD:  Jerelene Redden CHIEF COMPLAINT:  Sepsis  History of Present Illness:  Theresa Russell is a 60 y.o. F who was transferred from [facility] with sepsis.  She has a pertinent past medical history of COPD [facility]), HTN, systolic HF, afib depression, DM II, ESRD (Tu,Th,S dialysis), known wounds to her lower extremities, groin, glutes, and coccyx due to complicated hospital stay from February to April of this year due to necrotizing fasciitis with Morganella, E. Coli, and MRSA and later complicated by klebsiella pneumonia and klebsiella bacteremia. She was treated with a myriad of antibiotics including zosyn, clindamycin, cefepime, Zyvox, doxycycline and meropenem and multiple debridements. She reportedly does not ambulate and was residing in a SNF.  Per chart review and discussion with her son Ulice Dash, Theresa Russell was transferred from her SNF to [facility] on 5/9 with concerns of altered mental status. She was found to be hypotensive and hypoxic on assesment. He last dialysis was Saturday where is was reported that 2L was pulled off. She was started on levophed, 3L of IVF was given, cultures were drawn, and she was started on Linezolid and Meropenem. A head CT was completed which was read by outside radiology as negative for acute process. A CT chest, ABD, and pelvis was obtained which was negative for PE but concerning for "crazy paving" with extensive infilitrate of both lungs, air fluid collection of the left anterior perineum and left medial thigh measuring 5.5 cm which was concerning for an abcess, pelvic dermoid/teratoma. She was found to have a leukocytosis of 13.1, lactate of 2.1 which increased to 2.5, and troponin I of 265. During transfer to [facility] on 5/10 she received 266m LR bolus and found to be normotensive, confused and off of pressors at time of  arrival.  PCCM was consulted for admission for sepsis.  Pertinent  Medical History  COPD (2LNC) HTN Depression DM II ESRD (Tu,Th,S dialysis), Afib with RVR Nec fasciitis s/p surgical debridement with MRSA, Morganella and E. Coli infection. Klebsiella bacteremia/pneumonia Chronic systolic heart failure Known wounds to her lower extremities, groin, glutes, and coccyx.  Significant Hospital Events: Including procedures, antibiotic start and stop dates in addition to other pertinent events    [facility]   3/17 Drainage of inner thigh abcess  4/27 RT IJ Temp cath removal, RT IJ  Tunneled HD cath insertion.  5/9 Presented to [facility] with AMS. Found to be hypotensive. Head CT  negative for acute process. CT with/without chest, ABD, and pelvis was obtained which was negative for PE but concerning for "crazy paving" with extensive infilitrate of both lungs, air fluid collection of the left anterior perineum and left medial thigh measuring 5.5 cm which was concerning for an abcess, pelvic dermoid/teratoma. Started on Linezolid and Meropenem  [facility] performed I&D of left medial thigh wound with copious purulent discharge.  5/12 Episode of Afib with RVR to 150s during dialysis, switched from levophed to phenylephrine and amio started.  5/13 OR surgery for debridement  5/14 remained intubated after surgery  Antibiotics Linezolid 5/10>> Meropenem 5/11>>  Interim History / Subjective:   Patient did well after extubation yesterday. She is somnolent this morning after getting pain medicine around 5am. She awakes to shoulder rub.    Objective   Blood pressure 105/83, pulse (!) 111, temperature 97.7 F (36.5  C), resp. rate (!) 23, height _0  (1.676 m), weight 102 kg, SpO2 99 %.        Intake/Output Summary (Last 24 hours) at 01/07/2021 0825 Last data filed at 01/07/2021 0600 Gross per 24 hour  Intake 2550.89 ml  Output 600  ml  Net 1950.89 ml   Filed Weights   01/05/21 0247 01/06/21 0258 01/07/21 0316  Weight: 99.8 kg 99.8 kg 102 kg    Examination: General:no acute distress HEENT: [place]/AT, moist mucous membranes, sclera anicteric Neuro:somnolent, not following commands CV: irregularly irregular, tachycardic, s1s2, no murmurs  PULM:diminished. No wheezing  GI: soft, non-tender, non-distended, BS+  Extremities: warm, no edema  Skin: multiple wounds covered in bandages   Labs/imaging that I have personally reviewed  (right click and "Reselect all SmartList Selections" daily)  5/16 BMP, CBC   Resolved Hospital Problem list   Shock  Assessment & Plan:  Theresa Russell is a 60 y.o. F w/ pmhx of COPD (2LNC), HTN, systolic HF, afib depression, DM II, ESRD (Tu,Th,S dialysis), known wounds to her lower extremities, groin, glutes, and coccyx due to complicated hospital stay from February to April of this year due to necrotizing fasciitis who was transferred from [facility] with septic shock.  Sepsis due to soft tissue wounds/infection -Continue Meropenem and Linezolid for 7 day course - s/p debridement with Gen Surgery 5/13  Acute on Chronic Respiratory Failure with Hypoxia  COPD (2LNC at baseline). 5/9 CT negative for PE but concerning for "crazy paving" with extensive infilitrate of both lungs.  - Extubated 5/15 -Continue Abx as discussed above -scheduled duonebs  Acute Metabolic Encephalopathy Suspect secondary to sepsis. 5/9 head ct negative. - Mental status is worse today, possibly secondary to pain medicine given this morning. - Will give dose of narcan and monitor -Delirium prevention precautions. Promote normal sleep/wake cycle. Avoid restraints as able. -Minimize sedating medications - ammonia level not elevated  Left medial thigh/Perineum abscess s/p debridement 5/10 and 5/13 Per [facility] CT on 5/9. Surgery performed I&D 5/10 early AM with copious purulent discahrge and  potential communication with left medial leg. History of necrotizing fasciitis. Wound cultures previously grew morganella, e.coli, MRSA.  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 Per chart review was previously receiving dakins treatment to PI -Wound care consulted and following  Hx Afib Hx Systolic Heart Failure -Will continue amiodarone  -Continue telemetry - Will restart her metoprolol today if HD goes well  Hx of HTN Hypotensive currently -Resume home meds when appropriate  Anemia of Chronic Disease (Baseline 8-9) -Transfuse pRBC if Hgb <7 -Trend CBC  ESRD (Tu,Th,S dialysis) -HD per Nephrology -Trend CMP, MG, Phos  Moderate Malnutrition related to Chronic Illness -Nutrition following -Cortrak in place Nutrition recommend: - Vital 1.5 @ 60 ml/hr (1440 ml/day) - ProSource TF 45 ml QID  DM II -Continue SSI - Lantus 12 units BID -Blood Glucose goal 140-180  Depression -Zoloft 47m daily  Pelvic dermoid/teratoma Per [facility] CT on 5/9 -Follow up with PCP  Best practice (right click and "Reselect all SmartList Selections" daily)  Diet:  Tube Feed  Pain/Anxiety/Delirium protocol (if indicated): No VAP protocol (if indicated): Not indicated DVT prophylaxis: Subcutaneous Heparin GI prophylaxis: PPI Glucose control:  SSI Yes and Basal insulin Yes Central venous access:  Yes, and it is still needed Arterial line:  N/A Foley:  N/A Mobility:  bed rest  PT consulted: Yes Last date of multidisciplinary goals of care discussion  Code Status:  full code Disposition: ICU  Labs   CBC: Recent Labs  Lab 01/01/21 2039 01/02/21 0130 01/03/21 0607 01/04/21 0135 01/05/21 0240 01/06/21 0253 01/07/21 0311  WBC 16.2*   < > 14.1* 10.7* 9.5 8.3 14.4*  NEUTROABS 15.2*  --   --   --   --   --   --   HGB 8.8*   < >  9.3* 8.4* 9.6* 7.8* 7.7*  HCT 29.3*   < > 31.5* 28.1* 31.5* 25.2* 25.0*  MCV 94.5   < > 94.6 92.1 91.6 92.3 92.3  PLT 158   < > 179 123* 88* 86* 85*   < > = values in this interval not displayed.    Basic Metabolic Panel: Recent Labs  Lab 01/01/21 2039 01/02/21 0130 01/03/21 1224 01/03/21 2248 01/04/21 0135 01/05/21 0701 01/06/21 0253 01/07/21 0311  NA 133* 133*   < > 132* 134* 132* 132* 129*  K 5.6* 5.2*   < > 3.5 3.8 4.0 3.3* 3.5  CL 98 99   < > 96* 98 98 98 96*  CO2 20* 21*   < > 25 26 17* 20* 21*  GLUCOSE 162* 177*   < > 144* 110* 252* 355* 258*  BUN 55* 56*   < > 26* 27* 35* 39* 50*  CREATININE 4.18* 4.32*   < > 2.41* 2.66* 3.29* 3.32* 3.92*  CALCIUM 7.9* 8.0*   < > 7.7* 7.7* 7.3* 7.4* 7.6*  MG 1.9 1.9  --  1.9  --  1.7 2.1  --   PHOS 6.1* 6.5*  --   --   --  5.0* 5.3*  --    < > = values in this interval not displayed.   GFR: Estimated Creatinine Clearance: 19.1 mL/min (A) (by C-G formula based on SCr of 3.92 mg/dL (H)). Recent Labs  Lab 01/01/21 2039 01/02/21 0130 01/02/21 0853 01/03/21 0607 01/04/21 0135 01/05/21 0240 01/06/21 0253 01/07/21 0311  PROCALCITON 1.32  --   --   --   --   --   --   --   WBC 16.2*   < >  --    < > 10.7* 9.5 8.3 14.4*  LATICACIDVEN 1.5  --  2.1*  --   --   --   --   --    < > = values in this interval not displayed.    Liver Function Tests: Recent Labs  Lab 01/01/21 2039  AST 16  ALT 7  ALKPHOS 198*  BILITOT 0.7  PROT 6.2*  ALBUMIN 2.3*   No results for input(s): LIPASE, AMYLASE in the last 168 hours. Recent Labs  Lab 01/05/21 1600  AMMONIA 45*    ABG    Component Value Date/Time   PHART 7.274 (L) 11/29/2020 0905   PCO2ART 51.5 (H) 11/29/2020 0905   PO2ART 68.5 (L) 11/29/2020 0905   HCO3 20.6 01/05/2021 0701   ACIDBASEDEF 5.7 (H) 01/05/2021 0701   O2SAT 52.2 01/05/2021 0701     Coagulation Profile: No results for input(s): INR, PROTIME in the last 168 hours.  Cardiac Enzymes: No results for input(s):  CKTOTAL, CKMB, CKMBINDEX, TROPONINI in the last 168 hours.  HbA1C: Hgb A1c MFr Bld  Date/Time Value Ref Range Status  01/02/2021 01:30 AM 5.8 (H) 4.8 - 5.6 % Final    Comment:    (NOTE) Pre diabetes:  5.7%-6.4%  Diabetes:              >6.4%  Glycemic control for   <7.0% adults with diabetes   10/19/2020 03:58 AM 6.8 (H) 4.8 - 5.6 % Final    Comment:    (NOTE) Pre diabetes:          5.7%-6.4%  Diabetes:              >6.4%  Glycemic control for   <7.0% adults with diabetes     CBG: Recent Labs  Lab 01/06/21 1547 01/06/21 1938 01/06/21 2325 01/07/21 0336 01/07/21 0752  GLUCAP 207* 246* 246* 202* 196*      Critical care time: 35 minutes    Freda Jackson, MD [facility] Office: [phone]   See Amion for personal pager PCCM on call pager [phone] until 7pm. Please call Elink 7p-7a. [phone]

## 2021-01-07 NOTE — Progress Notes (Signed)
Subjective: Patient extubated and now more stable.  Continues to be critically ill though.  Minimally interactive today  Objective Vital signs in last 24 hours: Vitals:   01/07/21 0900 01/07/21 0915 01/07/21 0930 01/07/21 0945  BP: (!) 116/52  121/81   Pulse: (!) 120 (!) 120 (!) 121 (!) 123  Resp: _0 Temp:      TempSrc:      SpO2: 98% 96% 96% 97%  Weight:      Height:       Weight change: 2.2 kg  Intake/Output Summary (Last 24 hours) at 01/07/2021 1103 Last data filed at 01/07/2021 0900 Gross per 24 hour  Intake 2393.33 ml  Output 600 ml  Net 1793.33 ml   Dialyzes at [facility]  TTS 4 hours  EDW 93 kg. HD Bath 2/2.5, Dialyzer unknown, Heparin yes- 2000 per hour. Access TDC.BFR 350/dfr 500 epogen 8000 per tx, iron weekly    Assessment/Plan: 60 year old BF with multiple medical issues including ESRD and chronic wounds.  Complicated by sepsis  1 wounds/sepsis-  S/p a brief debridement per surgery upon admission-  s/p more extensive debridement 5/13-  requiring extensive antibiotic.  These wounds are significant-  Surgery having doubts that these can even heal 2 ESRD: normally TTS at [facility] via TDC-plan for hemodialysis again today.  More stable after extubation 3 Hypertension:  Blood pressure has been low likely related to sepsis.  Attempting to achieve some ultrafiltration today with dialysis 4. Anemia of ESRD: chronic issue, exacerbated with surgery-  Will cont ESA-  No iron due to infections 5. 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  Phos OK -  6.  Hyponatremia: Sodium 129.  Likely related to fluid excess.  Dialysis should help 7.  DM2: Complicated by hyperglycemia.  Management per primary team    Theresa Russell    Labs: Basic Metabolic Panel: Recent Labs  Lab 01/02/21 0130 01/03/21 0607 01/05/21 0701 01/06/21 0253 01/07/21 0311  NA 133*   < > 132* 132* 129*  K 5.2*   < > 4.0 3.3* 3.5   CL 99   < > 98 98 96*  CO2 21*   < > 17* 20* 21*  GLUCOSE 177*   < > 252* 355* 258*  BUN 56*   < > 35* 39* 50*  CREATININE 4.32*   < > 3.29* 3.32* 3.92*  CALCIUM 8.0*   < > 7.3* 7.4* 7.6*  PHOS 6.5*  --  5.0* 5.3*  --    < > = values in this interval not displayed.   Liver Function Tests: Recent Labs  Lab 01/01/21 2039  AST 16  ALT 7  ALKPHOS 198*  BILITOT 0.7  PROT 6.2*  ALBUMIN 2.3*   No results for input(s): LIPASE, AMYLASE in the last 168 hours. Recent Labs  Lab 01/05/21 1600  AMMONIA 45*   CBC: Recent Labs  Lab 01/01/21 2039 01/02/21 0130 01/03/21 0607 01/04/21 0135 01/05/21 0240 01/06/21 0253 01/07/21 0311  WBC 16.2*   < > 14.1* 10.7* 9.5 8.3 14.4*  NEUTROABS 15.2*  --   --   --   --   --   --   HGB 8.8*   < > 9.3* 8.4* 9.6* 7.8* 7.7*  HCT 29.3*   < > 31.5* 28.1* 31.5* 25.2* 25.0*  MCV 94.5   < > 94.6 92.1 91.6 92.3 92.3  PLT 158   < >  179 123* 88* 86* 85*   < > = values in this interval not displayed.   Cardiac Enzymes: No results for input(s): CKTOTAL, CKMB, CKMBINDEX, TROPONINI in the last 168 hours. CBG: Recent Labs  Lab 01/06/21 1547 01/06/21 1938 01/06/21 2325 01/07/21 0336 01/07/21 0752  GLUCAP 207* 246* 246* 202* 196*    Iron Studies:  No results for input(s): IRON, TIBC, TRANSFERRIN, FERRITIN in the last 72 hours. Studies/Results: DG Chest Port 1 View  Result Date: 01/05/2021 CLINICAL DATA:  PICC line placement. EXAM: PORTABLE CHEST 1 VIEW COMPARISON:  01/02/2021 FINDINGS: Stable appearance of RIGHT-sided dialysis catheter. Endotracheal tube is in place, tip approximately 6.8 centimeters above the carina. Feeding tube has been placed, tip beyond the level of the stomach. A new LEFT-sided PICC line has been placed, tip overlying the level of the LOWER superior vena cava. Heart is enlarged. Perihilar and basilar changes of mild pulmonary edema are similar to previous study. Stable elevation of the RIGHT hemidiaphragm. No pneumothorax.  IMPRESSION: Interval placement of LEFT-sided PICC line, feeding tube, and endotracheal tube. Cardiomegaly and mild edema. Electronically Signed   By: Nolon Nations M.D.   On: 01/05/2021 14:03   Medications: Infusions: . sodium chloride 10 mL/hr at 01/05/21 2000  . amiodarone 30 mg/hr (01/07/21 0600)  . feeding supplement (VITAL 1.5 CAL) 1,000 mL (01/06/21 1625)  . meropenem (MERREM) IV Stopped (01/06/21 1454)    Scheduled Medications: . chlorhexidine gluconate (MEDLINE KIT)  15 mL Mouth Rinse BID  . Chlorhexidine Gluconate Cloth  6 each Topical Q0600  . [START ON 01/10/2021] darbepoetin (ARANESP) injection - DIALYSIS  100 mcg Intravenous Q Thu-HD  . feeding supplement (PROSource TF)  90 mL Per Tube TID  . heparin  5,000 Units Subcutaneous Q8H  . insulin aspart  0-20 Units Subcutaneous Q4H  . insulin glargine  12 Units Subcutaneous BID  . ipratropium-albuterol  3 mL Nebulization TID  . multivitamin  1 tablet Per Tube QHS  . pantoprazole sodium  40 mg Per Tube QHS  . predniSONE  10 mg Per Tube Q breakfast  . sertraline  50 mg Per Tube Daily    have reviewed scheduled and prn medications.  Physical Exam: General:  Intubated and sedated Heart: tachy Lungs: dec BS at bases Abdomen: soft, non tender Extremities: some pitting edema-  Wounds on LE esp medial left thigh-  Very deep but clean Dialysis Access: right sided TDC-  No obvious infection    01/07/2021,11:03 AM  LOS: 6 days

## 2021-01-07 NOTE — Progress Notes (Signed)
[facility] for Infectious Disease  Date of Admission:  01/01/2021           Reason for visit: Follow up on abscess  Current antibiotics: Meropenem Linezolid  ASSESSMENT:    1. Left thigh abscess and perineal abscess with hx of necrotizing fasciitis: s/p I&D at bedside 01/01/21 followed by return to OR with general surgery 01/04/21.  Cultures from OR with multiple organisms none predominating.  No MRSA isolated on cultures here.  Previous wound cx with Morganella, E coli, and MRSA 2. ESRD on HD 3. Sacral wound: getting hydrotherapy  PLAN:    . Continue meropenem per pharmacy dosed for HD . Will stop linezolid as no MRSA isolated here and platelets noted to be decreasing . F/U cultures . Continue wound care   Principal Problem:   Septic shock ([place]) Active Problems:   Acute respiratory failure with hypoxia (HCC)   Acute metabolic encephalopathy   Thigh abscess   ESRD (end stage renal disease) (HCC)   Pressure injury of skin    MEDICATIONS:    Scheduled Meds: . chlorhexidine gluconate (MEDLINE KIT)  15 mL Mouth Rinse BID  . Chlorhexidine Gluconate Cloth  6 each Topical Q0600  . [START ON 01/10/2021] darbepoetin (ARANESP) injection - DIALYSIS  100 mcg Intravenous Q Thu-HD  . feeding supplement (PROSource TF)  45 mL Per Tube TID  . heparin  5,000 Units Subcutaneous Q8H  . insulin aspart  0-20 Units Subcutaneous Q4H  . insulin glargine  12 Units Subcutaneous BID  . ipratropium-albuterol  3 mL Nebulization TID  . multivitamin  1 tablet Per Tube QHS  . pantoprazole sodium  40 mg Per Tube QHS  . predniSONE  10 mg Per Tube Q breakfast  . sertraline  50 mg Per Tube Daily   Continuous Infusions: . sodium chloride 10 mL/hr at 01/05/21 2000  . amiodarone 30 mg/hr (01/07/21 0600)  . feeding supplement (VITAL 1.5 CAL)    . meropenem (MERREM) IV Stopped (01/06/21 1454)   PRN Meds:.acetaminophen (TYLENOL) oral liquid 160 mg/5 mL, docusate, fentaNYL (SUBLIMAZE)  injection, [START ON February 09, 2021] pneumococcal 23 valent vaccine, polyethylene glycol, silver nitrate applicators  SUBJECTIVE:   24 hour events:  Increased WBC No fevers Extubated yesterday  Somnolent today and does not provide subjective history.   Review of Systems  Unable to perform ROS: Mental acuity      OBJECTIVE:   Blood pressure 121/81, pulse (!) 123, temperature (P) 97.7 F (36.5 C), resp. rate 15, height 5' 6"  (1.676 m), weight 102 kg, SpO2 97 %. Body mass index is 36.29 kg/m.  Physical Exam Constitutional:      Comments: Chronically ill appearing, does not engage but arouses to stimuli.  HENT:     Head: Normocephalic and atraumatic.  Eyes:     General: No scleral icterus.    Conjunctiva/sclera: Conjunctivae normal.  Pulmonary:     Effort: Pulmonary effort is normal. No respiratory distress.  Abdominal:     General: There is distension.     Palpations: Abdomen is soft.     Tenderness: There is no abdominal tenderness.  Musculoskeletal:     Comments: Multiple skin wound covered in bandages.  Sacral wound picture reviewed.       Lab Results: Lab Results  Component Value Date   WBC 14.4 (H) 01/07/2021   HGB 7.7 (L) 01/07/2021   HCT 25.0 (L) 01/07/2021   MCV 92.3 01/07/2021   PLT 85 (L) 01/07/2021  Lab Results  Component Value Date   NA 129 (L) 01/07/2021   K 3.5 01/07/2021   CO2 21 (L) 01/07/2021   GLUCOSE 258 (H) 01/07/2021   BUN 50 (H) 01/07/2021   CREATININE 3.92 (H) 01/07/2021   CALCIUM 7.6 (L) 01/07/2021   GFRNONAA 13 (L) 01/07/2021    Lab Results  Component Value Date   ALT 7 01/01/2021   AST 16 01/01/2021   ALKPHOS 198 (H) 01/01/2021   BILITOT 0.7 01/01/2021    No results found for: CRP  No results found for: ESRSEDRATE   I have reviewed the micro and lab results in Epic.  Imaging: DG Chest Port 1 View  Result Date: 01/05/2021 CLINICAL DATA:  PICC line placement. EXAM: PORTABLE CHEST 1 VIEW COMPARISON:  01/02/2021 FINDINGS:  Stable appearance of RIGHT-sided dialysis catheter. Endotracheal tube is in place, tip approximately 6.8 centimeters above the carina. Feeding tube has been placed, tip beyond the level of the stomach. A new LEFT-sided PICC line has been placed, tip overlying the level of the LOWER superior vena cava. Heart is enlarged. Perihilar and basilar changes of mild pulmonary edema are similar to previous study. Stable elevation of the RIGHT hemidiaphragm. No pneumothorax. IMPRESSION: Interval placement of LEFT-sided PICC line, feeding tube, and endotracheal tube. Cardiomegaly and mild edema. Electronically Signed   By: Nolon Nations M.D.   On: 01/05/2021 14:03     Imaging independently reviewed in Epic.    Raynelle Highland for Infectious [facility] [phone] pager 01/07/2021, 12:47 PM

## 2021-01-07 NOTE — Progress Notes (Signed)
Patient was placed on BIPAP per CCM orders. MD is aware that the patient is unresponsive but wants to trail BIPAP.

## 2021-01-07 NOTE — Progress Notes (Signed)
Inpatient Diabetes Program Recommendations  AACE/ADA: New Consensus Statement on Inpatient Glycemic Control   Target Ranges:  Prepandial:   less than 140 mg/dL      Peak postprandial:   less than 180 mg/dL (1-2 hours)      Critically ill patients:  140 - 180 mg/dL   Results for MELVENA, VINK (MRN [id number]) as of 01/07/2021 11:32  Ref. Range 01/06/2021 07:30 01/06/2021 08:34 01/06/2021 11:47 01/06/2021 15:47 01/06/2021 19:38 01/06/2021 23:25 01/07/2021 03:36 01/07/2021 07:52  Glucose-Capillary Latest Ref Range: 70 - 99 mg/dL 137 (H) 125 (H) 178 (H) 207 (H) 246 (H) 246 (H) 202 (H) 196 (H)   Review of Glycemic Control  Diabetes history: DM2 Outpatient Diabetes medications: Humalog 2-12 units QID Current orders for Inpatient glycemic control: Lantus 12 units BID, Novolog 0-20 units Q4H; Prednisone 10 mg daily, Vital @ 45 ml/hr  Inpatient Diabetes Program Recommendations:    Insulin: Please consider ordering Novolog 4 units Q4H for tube feeding coverage. If tube feeding is stopped or held then Novolog tube feeding coverage should also be stopped or held.  Thanks, Barnie Alderman, RN, MSN, CDE Diabetes Coordinator Inpatient Diabetes Program [phone] (Team Pager from 8am to 5pm)

## 2021-01-07 NOTE — TOC Progression Note (Signed)
Transition of Care [facility]) - Progression Note    Patient Details  Name: Theresa Russell MRN: [id number] Date of Birth: January 28, 1961  [facility] CM/SW Contact  Bartholomew Crews, RN Phone Number: [phone] 01/07/2021, 2:45 PM  Clinical Narrative:     Spoke with patient's son, Ulice Dash [phone], to discuss transition planning. Patient had been at [facility] for about 3 months and was discharged to [facility] in [place] where she was for about 5 days or so before being sent to [facility].   Prior to her recent hospitalizations she had been living at home with her son at the address listed in [place]. Her PCP is Dr. Neita Garnet. She had been ambulatory with a cane. She does not have home oxygen at home. She had received home health services for nursing to assist with wound care through [facility]. She has been HD dependent for about 6 months - attends [employer] MWF until recently changed to TTS. She would use Medicaid transportation for dialysis transportation.   Son is not interested in his mom returning to [facility]. He is agreeable to his mom returning to [facility] for LTAC, and wants her to ultimately return home with him where he will be her caregiver.   Referral to [facility]. Patient not medically ready today. TOC following for transition needs.   Expected Discharge Plan: Long Term Acute Care (LTAC) Barriers to Discharge: Continued Medical Work up  Expected Discharge Plan and Services Expected Discharge Plan: Long Term Acute Care (LTAC) In-house Referral: Clinical Social Work Discharge Planning Services: CM Consult Post Acute Care Choice: Long Term Acute Care (LTAC) Living arrangements for the past 2 months: [place]                 DME Arranged: N/A DME Agency: NA       HH Arranged: NA HH Agency: NA         Social Determinants of Health (SDOH) Interventions    Readmission Risk Interventions No  flowsheet data found.

## 2021-01-07 NOTE — Progress Notes (Signed)
[facility] Progress Note  3 Days Post-Op  Subjective: CC-  Extubated yesterday morning.   Objective: Vital signs in last 24 hours: Temp:  [97.7 F (36.5 C)-98.2 F (36.8 C)] 97.7 F (36.5 C) (05/16 0722) Pulse Rate:  [104-135] 123 (05/16 0945) Resp:  [12-35] 15 (05/16 0945) BP: (92-127)/(34-105) 121/81 (05/16 0930) SpO2:  [92 %-100 %] 97 % (05/16 0945) Weight:  [102 kg] 102 kg (05/16 0316) Last BM Date: 01/07/21  Intake/Output from previous day: 05/15 0701 - 05/16 0700 In: 2672.1 [I.V.:772.1; NG/GT:1200; IV Piggyback:700] Out: 600 [Stool:600] Intake/Output this shift: Total I/O In: 225 [NG/GT:225] Out: -   PE: Gen:  NAD Card:  irregular, tachycardic Pulm: rate and effort normal GU: sacral wound open with mostly fibrinous tissue, visible and palpable bone, no purulent drainage or cellulitis     Lab Results:  Recent Labs    01/06/21 0253 01/07/21 0311  WBC 8.3 14.4*  HGB 7.8* 7.7*  HCT 25.2* 25.0*  PLT 86* 85*   BMET Recent Labs    01/06/21 0253 01/07/21 0311  NA 132* 129*  K 3.3* 3.5  CL 98 96*  CO2 20* 21*  GLUCOSE 355* 258*  BUN 39* 50*  CREATININE 3.32* 3.92*  CALCIUM 7.4* 7.6*   PT/INR No results for input(s): LABPROT, INR in the last 72 hours. CMP     Component Value Date/Time   NA 129 (L) 01/07/2021 0311   K 3.5 01/07/2021 0311   CL 96 (L) 01/07/2021 0311   CO2 21 (L) 01/07/2021 0311   GLUCOSE 258 (H) 01/07/2021 0311   BUN 50 (H) 01/07/2021 0311   CREATININE 3.92 (H) 01/07/2021 0311   CALCIUM 7.6 (L) 01/07/2021 0311   PROT 6.2 (L) 01/01/2021 2039   ALBUMIN 2.3 (L) 01/01/2021 2039   AST 16 01/01/2021 2039   ALT 7 01/01/2021 2039   ALKPHOS 198 (H) 01/01/2021 2039   BILITOT 0.7 01/01/2021 2039   GFRNONAA 13 (L) 01/07/2021 0311   Lipase  No results found for: LIPASE     Studies/Results: DG Chest Port 1 View  Result Date: 01/05/2021 CLINICAL DATA:  PICC line placement. EXAM: PORTABLE CHEST 1 VIEW COMPARISON:   01/02/2021 FINDINGS: Stable appearance of RIGHT-sided dialysis catheter. Endotracheal tube is in place, tip approximately 6.8 centimeters above the carina. Feeding tube has been placed, tip beyond the level of the stomach. A new LEFT-sided PICC line has been placed, tip overlying the level of the LOWER superior vena cava. Heart is enlarged. Perihilar and basilar changes of mild pulmonary edema are similar to previous study. Stable elevation of the RIGHT hemidiaphragm. No pneumothorax. IMPRESSION: Interval placement of LEFT-sided PICC line, feeding tube, and endotracheal tube. Cardiomegaly and mild edema. Electronically Signed   By: Nolon Nations M.D.   On: 01/05/2021 14:03    Anti-infectives: Anti-infectives (From admission, onward)   Start     Dose/Rate Route Frequency Ordered Stop   01/02/21 1500  meropenem (MERREM) 500 mg in sodium chloride 0.9 % 100 mL IVPB        500 mg 200 mL/hr over 30 Minutes Intravenous Every 24 hours 01/01/21 2010 01/11/21 2359   01/01/21 2200  linezolid (ZYVOX) IVPB 600 mg  Status:  Discontinued        600 mg 300 mL/hr over 60 Minutes Intravenous Every 12 hours 01/01/21 2010 01/07/21 3762       Assessment/Plan Acute on chronic respiratory failure with hypoxia, Hx COPD on home O2 Acute metabolic encephalopathy A.fib CHF Anemia  of chronic disease ESRD Malnutrition DMII Depression Pelvic dermoid/teratoma  ---per primary team---  Perirectal soft tissue infection -S/p bedside I&D 5/10 Dr. Kae Heller  -S/P I&D in OR 5/13 Dr. Marlou Starks - wounds noted to be fairly clean in OR.  - continue BID moist-to-dry dressing changes to all lower extremity wounds - Continue hydrotherapy to sacral wound, it is cleaning up well with hydrotherapy and BID wet to dry dressing changes.   FEN: NPO, IVF, TF TID  ID: merrem 5/11>>5/20, linezolid 5/10>>5/16 VTE: SQH     LOS: 6 days    Wellington Hampshire, [facility] Surgery 01/07/2021, 11:24 AM Please see Amion for pager  number during day hours 7:00am-4:30pm

## 2021-01-08 DIAGNOSIS — A419 Sepsis, unspecified organism: Secondary | ICD-10-CM | POA: Diagnosis not present

## 2021-01-08 DIAGNOSIS — N186 End stage renal disease: Secondary | ICD-10-CM | POA: Diagnosis not present

## 2021-01-08 DIAGNOSIS — L89309 Pressure ulcer of unspecified buttock, unspecified stage: Secondary | ICD-10-CM | POA: Diagnosis not present

## 2021-01-08 DIAGNOSIS — R6521 Severe sepsis with septic shock: Secondary | ICD-10-CM | POA: Diagnosis not present

## 2021-01-08 DIAGNOSIS — L02419 Cutaneous abscess of limb, unspecified: Secondary | ICD-10-CM | POA: Diagnosis not present

## 2021-01-08 LAB — BASIC METABOLIC PANEL
Anion gap: 11 (ref 5–15)
BUN: 24 mg/dL — ABNORMAL HIGH (ref 6–20)
CO2: 25 mmol/L (ref 22–32)
Calcium: 7.6 mg/dL — ABNORMAL LOW (ref 8.9–10.3)
Chloride: 98 mmol/L (ref 98–111)
Creatinine, Ser: 2.26 mg/dL — ABNORMAL HIGH (ref 0.44–1.00)
GFR, Estimated: 25 mL/min — ABNORMAL LOW (ref >60)
Glucose, Bld: 128 mg/dL — ABNORMAL HIGH (ref 70–99)
Potassium: 3.2 mmol/L — ABNORMAL LOW (ref 3.5–5.1)
Sodium: 134 mmol/L — ABNORMAL LOW (ref 135–145)

## 2021-01-08 LAB — GLUCOSE, CAPILLARY
Glucose-Capillary: 80 mg/dL (ref 70–99)
Glucose-Capillary: 95 mg/dL (ref 70–99)
Glucose-Capillary: 155 mg/dL — ABNORMAL HIGH (ref 70–99)
Glucose-Capillary: 164 mg/dL — ABNORMAL HIGH (ref 70–99)
Glucose-Capillary: 91 mg/dL (ref 70–99)
Glucose-Capillary: 99 mg/dL (ref 70–99)

## 2021-01-08 LAB — MAGNESIUM: Magnesium: 1.3 mg/dL — ABNORMAL LOW (ref 1.7–2.4)

## 2021-01-08 MED ORDER — INSULIN GLARGINE 100 UNIT/ML ~~LOC~~ SOLN
6.0000 [IU] | Freq: Every day | SUBCUTANEOUS | Status: AC
  Administered 2021-01-08: 6 [IU] via SUBCUTANEOUS
  Filled 2021-01-08: qty 0.06

## 2021-01-08 MED ORDER — SODIUM CHLORIDE 0.9% FLUSH: 10.0000 mL | INTRAVENOUS | Status: DC | PRN

## 2021-01-08 MED ORDER — MAGNESIUM SULFATE 50 % IJ SOLN: 3.0000 g | Freq: Once | INTRAVENOUS | Status: DC

## 2021-01-08 MED ORDER — OXYCODONE HCL 5 MG PO TABS
5.0000 mg | ORAL_TABLET | Freq: Once | ORAL | Status: AC
  Administered 2021-01-08: 5 mg
  Filled 2021-01-08: qty 1

## 2021-01-08 MED ORDER — MAGNESIUM SULFATE 50 % IJ SOLN
3.0000 g | Freq: Once | INTRAVENOUS | Status: AC
  Administered 2021-01-08: 3 g via INTRAVENOUS
  Filled 2021-01-08: qty 6

## 2021-01-08 MED ORDER — ACETAMINOPHEN 160 MG/5ML PO SOLN
650.0000 mg | Freq: Four times a day (QID) | ORAL | Status: DC
  Administered 2021-01-08 – 2021-01-09 (×5): 650 mg
  Filled 2021-01-08 (×5): qty 20.3

## 2021-01-08 MED ORDER — MIDODRINE HCL 5 MG PO TABS
5.0000 mg | ORAL_TABLET | Freq: Three times a day (TID) | ORAL | Status: DC
  Administered 2021-01-08 (×2): 5 mg via ORAL
  Filled 2021-01-08 (×2): qty 1

## 2021-01-08 MED ORDER — TRAMADOL HCL 50 MG PO TABS
50.0000 mg | ORAL_TABLET | Freq: Two times a day (BID) | ORAL | Status: DC | PRN
  Administered 2021-01-08: 50 mg
  Filled 2021-01-08 (×2): qty 1

## 2021-01-08 MED ORDER — OXYCODONE HCL 5 MG PO TABS
5.0000 mg | ORAL_TABLET | Freq: Three times a day (TID) | ORAL | Status: DC | PRN
  Administered 2021-01-08 – 2021-01-09 (×5): 10 mg via ORAL
  Filled 2021-01-08 (×5): qty 2

## 2021-01-08 MED ORDER — SODIUM CHLORIDE 0.9% FLUSH
10.0000 mL | Freq: Two times a day (BID) | INTRAVENOUS | Status: DC
  Administered 2021-01-08: 30 mL
  Administered 2021-01-08 – 2021-01-09 (×2): 10 mL

## 2021-01-08 MED ORDER — POTASSIUM CHLORIDE 10 MEQ/100ML IV SOLN
10.0000 meq | INTRAVENOUS | Status: AC
  Administered 2021-01-08 (×3): 10 meq via INTRAVENOUS
  Filled 2021-01-08 (×3): qty 100

## 2021-01-08 MED ORDER — PHENYLEPHRINE HCL-NACL 10-0.9 MG/250ML-% IV SOLN
0.0000 ug/min | INTRAVENOUS | Status: DC
  Administered 2021-01-08: 10 ug/min via INTRAVENOUS
  Filled 2021-01-08: qty 250

## 2021-01-08 MED ORDER — HEPARIN SODIUM (PORCINE) 1000 UNIT/ML IJ SOLN
INTRAMUSCULAR | Status: AC
  Filled 2021-01-08: qty 4

## 2021-01-08 MED ORDER — AMIODARONE IV BOLUS ONLY 150 MG/100ML
150.0000 mg | Freq: Once | INTRAVENOUS | Status: AC
  Administered 2021-01-08: 150 mg via INTRAVENOUS
  Filled 2021-01-08: qty 100

## 2021-01-08 NOTE — Progress Notes (Signed)
Physical Therapy Wound Treatment Patient Details  Name: Theresa Russell MRN: [id number] Date of Birth: 1961-01-17  Today's Date: 01/08/2021 Time: 1517-6160 Time Calculation (min): 35 min  Subjective  Subjective Assessment Subjective: Pt anxious and reporting pain. Reports she is upset that we did hydrotherapy yesterday without her knowing. Explained that she was obtunded and slept throughout the entirety of the session. Pt agreed to hydrotherapy this date. Patient and Family Stated Goals: None stated Date of Onset:  (Unsure) Prior Treatments: Dressing changes  Pain Score:  Increased pain reported as pt not able to be premedicated due to hypotension.   Wound Assessment  Pressure Injury 01/01/21 Sacrum Medial Unstageable - Full thickness tissue loss in which the base of the injury is covered by slough (yellow, tan, gray, green or brown) and/or eschar (tan, brown or black) in the wound bed. (Active)  Dressing Type ABD;Barrier Film (skin prep);Gauze (Comment);Moist to moist 01/08/21 1321  Dressing Changed;Clean;Intact;New drainage 01/08/21 1321  Dressing Change Frequency Daily 01/08/21 1321  State of Healing Eschar 01/08/21 1321  Site / Wound Assessment Black;Red;Yellow 01/08/21 1321  % Wound base Red or Granulating 60% 01/08/21 1321  % Wound base Yellow/Fibrinous Exudate 25% 01/08/21 1321  % Wound base Black/Eschar 15% 01/08/21 1321  % Wound base Other/Granulation Tissue (Comment) 0% 01/08/21 1321  Peri-wound Assessment Intact;Bleeding 01/08/21 1321  Wound Length (cm) 10.2 cm 01/07/21 1000  Wound Width (cm) 10.9 cm 01/07/21 1000  Wound Depth (cm) 2.5 cm 01/07/21 1000  Wound Surface Area (cm^2) 111.18 cm^2 01/07/21 1000  Wound Volume (cm^3) 277.95 cm^3 01/07/21 1000  Tunneling (cm) 0 01/07/21 1223  Undermining (cm) Up to 3.5 cm throughout the circumference of the wound 01/07/21 1000  Margins Unattached edges (unapproximated) 01/08/21 1321  Drainage Amount Moderate 01/08/21 1321   Drainage Description Sanguineous 01/08/21 1321  Treatment Hydrotherapy (Pulse lavage);Packing (Saline gauze) 01/08/21 1321      Hydrotherapy Pulsed lavage therapy - wound location: Sacrum Pulsed Lavage with Suction (psi): 12 psi Pulsed Lavage with Suction - Normal Saline Used: 1000 mL Pulsed Lavage Tip: Tip with splash shield Selective Debridement Selective Debridement - Location: Sacrum Selective Debridement - Tools Used: Forceps,Scalpel,Scissors Selective Debridement - Tissue Removed: Eschar and yellow nonviable tissue    Wound Assessment and Plan  Wound Therapy - Assess/Plan/Recommendations Wound Therapy - Clinical Statement: Pt anxious and reports increased pain this session. Pt not able to get pain medication due to hypotension. Opted to skip debridement for pt comfort but overall tolerated pulsed lavage well without complaints. Pt was left with RN and NT changing other dressings while pt was turned. This patient will benefit from continued hydrotherapy for selective removal of unviable tissue, to decrease bioburden, and promote wound bed healing. Wound Therapy - Functional Problem List: Global weakness in the setting of almost 3 month span of hospitilizations and SNF stay. Factors Delaying/Impairing Wound Healing: Diabetes Mellitus,Immobility,Multiple medical problems,Infection - systemic/local Hydrotherapy Plan: Debridement,Dressing change,Patient/family education,Pulsatile lavage with suction Wound Therapy - Frequency: 6X / week Wound Therapy - Follow Up Recommendations: dressing changes by RN  Wound Therapy Goals- Improve the function of patient's integumentary system by progressing the wound(s) through the phases of wound healing (inflammation - proliferation - remodeling) by: Wound Therapy Goals - Improve the function of patient's integumentary system by progressing the wound(s) through the phases of wound healing by: Decrease Necrotic Tissue to: 20% Decrease Necrotic Tissue  - Progress: Progressing toward goal Increase Granulation Tissue to: 80% Increase Granulation Tissue - Progress: Progressing toward goal Goals/treatment plan/discharge  plan were made with and agreed upon by patient/family: Yes Time For Goal Achievement: 7 days Wound Therapy - Potential for Goals: Good  Goals will be updated until maximal potential achieved or discharge criteria met.  Discharge criteria: when goals achieved, discharge from hospital, MD decision/surgical intervention, no progress towards goals, refusal/missing three consecutive treatments without notification or medical reason.  GP     Charges PT Wound Care Charges $Wound Debridement up to 20 cm: < or equal to 20 cm $ Wound Debridement each add'l 20 sqcm: 2 $PT PLS Gun and Tip: 1 Supply $PT Hydrotherapy Visit: 1 Visit       Thelma Comp 01/08/2021, 1:28 PM   Rolinda Roan, PT, DPT Acute Rehabilitation Services Pager: [phone] Office: [phone]

## 2021-01-08 NOTE — Progress Notes (Signed)
eLink Physician-Brief Progress Note Patient Name: Theresa Russell DOB: 01/15/1961 MRN: [id number]   Date of Service  01/08/2021  HPI/Events of Note  BP soft on dialysis.  eICU Interventions  Phenylephrine infusion ordered for BP support during dialysis.        Theresa Russell 01/08/2021, 2:45 AM

## 2021-01-08 NOTE — Progress Notes (Signed)
[facility] for Infectious Disease  Date of Admission:  01/01/2021           Reason for visit: Follow up on abscess  Current antibiotics: Meropenem  ASSESSMENT:    1. Left thigh abscess and perineal abscess with hx of necrotizing fasciitis: s/p I&D at bedside 01/01/21 followed by return to OR with general surgery 01/04/21.  Cultures from OR with multiple organisms none predominating.  No MRSA isolated on cultures here.  Previous wound cx with Morganella, E coli, and MRSA 2. ESRD on HD 3. Sacral wound: getting hydrotherapy  PLAN:    . Continue meropenem per pharmacy dosed for HD.  Plan for 7 days post debridement.  End date 01/11/21 . Continue wound care . Will sign off for now, please call as needed with questions/concerns.    Principal Problem:   Septic shock ([place]) Active Problems:   Acute respiratory failure with hypoxia (HCC)   Acute metabolic encephalopathy   Thigh abscess   ESRD (end stage renal disease) (HCC)   Pressure injury of skin    MEDICATIONS:    Scheduled Meds: . chlorhexidine gluconate (MEDLINE KIT)  15 mL Mouth Rinse BID  . Chlorhexidine Gluconate Cloth  6 each Topical Q0600  . [START ON 01/10/2021] darbepoetin (ARANESP) injection - DIALYSIS  100 mcg Intravenous Q Thu-HD  . feeding supplement (PROSource TF)  45 mL Per Tube TID  . heparin  5,000 Units Subcutaneous Q8H  . heparin sodium (porcine)      . insulin aspart  0-20 Units Subcutaneous Q4H  . insulin glargine  12 Units Subcutaneous BID  . ipratropium-albuterol  3 mL Nebulization TID  . midodrine  5 mg Oral TID WC  . multivitamin  1 tablet Per Tube QHS  . pantoprazole sodium  40 mg Per Tube QHS  . predniSONE  10 mg Per Tube Q breakfast  . sertraline  50 mg Per Tube Daily  . sodium chloride flush  10-40 mL Intracatheter Q12H   Continuous Infusions: . sodium chloride 10 mL/hr at 01/05/21 2000  . amiodarone 60 mg/hr (01/08/21 1034)  . amiodarone    . feeding supplement (VITAL 1.5 CAL)  1,000 mL (01/07/21 2200)  . meropenem (MERREM) IV Stopped (01/07/21 1454)  . phenylephrine (NEO-SYNEPHRINE) Adult infusion 10 mcg/min (01/08/21 0400)  . potassium chloride 10 mEq (01/08/21 1037)   PRN Meds:.acetaminophen (TYLENOL) oral liquid 160 mg/5 mL, docusate, [START ON 2021-02-15] pneumococcal 23 valent vaccine, polyethylene glycol, silver nitrate applicators, sodium chloride flush, traMADol  SUBJECTIVE:   24 hour events:  Pt more somnolent overnight requiring BiPAP.  Thought to be 2/2 uremia.  Received HD and noted to have soft BP requiring pressors.  No fevers.  Reports pain in leg and backside.  Tolerating abx.  No other complaints.   Review of Systems  All other systems reviewed and are negative.     OBJECTIVE:   Blood pressure (!) 119/28, pulse (!) 123, temperature (!) 97.4 F (36.3 C), temperature source Axillary, resp. rate 13, height 5' 6"  (1.676 m), weight 102 kg, SpO2 (!) 89 %. Body mass index is 36.29 kg/m.  Physical Exam Constitutional:      Comments: Chronically ill appearing, more alert today.  Answering questions appropriately.   HENT:     Head: Normocephalic and atraumatic.  Eyes:     Extraocular Movements: Extraocular movements intact.     Conjunctiva/sclera: Conjunctivae normal.  Pulmonary:     Effort: Pulmonary effort is normal. No respiratory distress.  Comments: On nasal cannula. Abdominal:     Palpations: Abdomen is soft.     Tenderness: There is no abdominal tenderness.  Musculoskeletal:     Comments: Multiple skin wound covered in bandages.  Sacral wound picture reviewed.   Neurological:     General: No focal deficit present.     Mental Status: She is oriented to person, place, and time.  Psychiatric:        Mood and Affect: Mood normal.        Behavior: Behavior normal.      Lab Results: Lab Results  Component Value Date   WBC 14.4 (H) 01/07/2021   HGB 9.2 (L) 01/07/2021   HCT 27.0 (L) 01/07/2021   MCV 92.3 01/07/2021   PLT 85  (L) 01/07/2021    Lab Results  Component Value Date   NA 134 (L) 01/08/2021   K 3.2 (L) 01/08/2021   CO2 25 01/08/2021   GLUCOSE 128 (H) 01/08/2021   BUN 24 (H) 01/08/2021   CREATININE 2.26 (H) 01/08/2021   CALCIUM 7.6 (L) 01/08/2021   GFRNONAA 25 (L) 01/08/2021    Lab Results  Component Value Date   ALT 7 01/01/2021   AST 16 01/01/2021   ALKPHOS 198 (H) 01/01/2021   BILITOT 0.7 01/01/2021    No results found for: CRP  No results found for: ESRSEDRATE   I have reviewed the micro and lab results in Epic.  Imaging: No results found.   Imaging independently reviewed in Epic.    Raynelle Highland for Infectious Disease [facility] [phone] pager 01/08/2021, 10:52 AM

## 2021-01-08 NOTE — Progress Notes (Signed)
Subjective: Patient on BiPAP today.  Minimally interactive underwent hemodialysis last night.  Minimal ultrafiltration.  Required phenylephrine.  Objective Vital signs in last 24 hours: Vitals:   01/08/21 0748 01/08/21 0800 01/08/21 0845 01/08/21 0900  BP: 114/76 (!) 105/50  (!) 119/28  Pulse: (!) 109 (!) 115 (!) 128 (!) 123  Resp: 19 11 (!) 22 13  Temp:      TempSrc:      SpO2: 99% 96% 95% (!) 89%  Weight:      Height:       Weight change:   Intake/Output Summary (Last 24 hours) at 01/08/2021 1007 Last data filed at 01/08/2021 0445 Gross per 24 hour  Intake 1213.11 ml  Output 831 ml  Net 382.11 ml   Dialyzes at [place]  TTS 4 hours  EDW 93 kg. HD Bath 2/2.5, Dialyzer unknown, Heparin yes- 2000 per hour. Access TDC.BFR 350/dfr 500 epogen 8000 per tx, iron weekly    Assessment/Plan: 60 year old BF with multiple medical issues including ESRD and chronic wounds.  Complicated by sepsis  1 wounds/sepsis-  S/p a brief debridement per surgery upon admission-  s/p more extensive debridement 5/13-  requiring extensive antibiotic.  These wounds are significant-  Surgery having doubts that these can even heal 2 ESRD: normally TTS at [facility] via TDC-underwent dialysis last night.  Had hypotension requiring phenylephrine.  Hopefully patient will continue to improve hemodynamically.  Likely hold off on dialysis until Thursday. 3 Hypertension:  Blood pressure has been low likely related to sepsis.  Ultrafiltration limited by hypotension 4. Anemia of ESRD: chronic issue, exacerbated with surgery-  Will cont ESA-  No iron due to infections 5. 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  Phos OK -  6.  Hyponatremia: Minimal.  Improving with dialysis 7.  DM2: Complicated by hyperglycemia.  Management per primary team    Reesa Chew    Labs: Basic Metabolic Panel: Recent Labs  Lab 01/02/21 0130 01/03/21 6295  01/05/21 0701 01/06/21 0253 01/07/21 0311 01/07/21 2007 01/07/21 2242 01/08/21 0508  NA 133*   < > 132* 132* 129* 130* 130* 134*  K 5.2*   < > 4.0 3.3* 3.5 3.7 3.7 3.2*  CL 99   < > 98 98 96*  --   --  98  CO2 21*   < > 17* 20* 21*  --   --  25  GLUCOSE 177*   < > 252* 355* 258*  --   --  128*  BUN 56*   < > 35* 39* 50*  --   --  24*  CREATININE 4.32*   < > 3.29* 3.32* 3.92*  --   --  2.26*  CALCIUM 8.0*   < > 7.3* 7.4* 7.6*  --   --  7.6*  PHOS 6.5*  --  5.0* 5.3*  --   --   --   --    < > = values in this interval not displayed.   Liver Function Tests: Recent Labs  Lab 01/01/21 2039  AST 16  ALT 7  ALKPHOS 198*  BILITOT 0.7  PROT 6.2*  ALBUMIN 2.3*   No results for input(s): LIPASE, AMYLASE in the last 168 hours. Recent Labs  Lab 01/05/21 1600  AMMONIA 45*   CBC: Recent Labs  Lab 01/01/21 2039 01/02/21 0130 01/03/21 2841 01/04/21 0135 01/05/21 0240 01/06/21 0253 01/07/21 0311 01/07/21 2007 01/07/21 2242  WBC 16.2*   < > 14.1* 10.7* 9.5 8.3 14.4*  --   --   NEUTROABS 15.2*  --   --   --   --   --   --   --   --   HGB 8.8*   < > 9.3* 8.4* 9.6* 7.8* 7.7* 9.2* 9.2*  HCT 29.3*   < > 31.5* 28.1* 31.5* 25.2* 25.0* 27.0* 27.0*  MCV 94.5   < > 94.6 92.1 91.6 92.3 92.3  --   --   PLT 158   < > 179 123* 88* 86* 85*  --   --    < > = values in this interval not displayed.   Cardiac Enzymes: No results for input(s): CKTOTAL, CKMB, CKMBINDEX, TROPONINI in the last 168 hours. CBG: Recent Labs  Lab 01/07/21 1532 01/07/21 1927 01/07/21 2331 01/08/21 0316 01/08/21 0724  GLUCAP 183* 126* 100* 80 95    Iron Studies:  No results for input(s): IRON, TIBC, TRANSFERRIN, FERRITIN in the last 72 hours. Studies/Results: No results found. Medications: Infusions: . sodium chloride 10 mL/hr at 01/05/21 2000  . amiodarone 30 mg/hr (01/08/21 0400)  . amiodarone    . feeding supplement (VITAL 1.5 CAL) 1,000 mL (01/07/21 2200)  . meropenem (MERREM) IV Stopped (01/07/21  1454)  . phenylephrine (NEO-SYNEPHRINE) Adult infusion 10 mcg/min (01/08/21 0400)  . potassium chloride 10 mEq (01/08/21 0921)    Scheduled Medications: . chlorhexidine gluconate (MEDLINE KIT)  15 mL Mouth Rinse BID  . Chlorhexidine Gluconate Cloth  6 each Topical Q0600  . [START ON 01/10/2021] darbepoetin (ARANESP) injection - DIALYSIS  100 mcg Intravenous Q Thu-HD  . feeding supplement (PROSource TF)  45 mL Per Tube TID  . heparin  5,000 Units Subcutaneous Q8H  . heparin sodium (porcine)      . insulin aspart  0-20 Units Subcutaneous Q4H  . insulin glargine  12 Units Subcutaneous BID  . ipratropium-albuterol  3 mL Nebulization TID  . midodrine  5 mg Oral TID WC  . multivitamin  1 tablet Per Tube QHS  . pantoprazole sodium  40 mg Per Tube QHS  . predniSONE  10 mg Per Tube Q breakfast  . sertraline  50 mg Per Tube Daily  . sodium chloride flush  10-40 mL Intracatheter Q12H    have reviewed scheduled and prn medications.  Physical Exam: General:  Intubated and sedated Heart: tachy Lungs: dec BS at bases Abdomen: soft, non tender Extremities: some pitting edema-  Wounds on LE esp medial left thigh-  Very deep but clean Dialysis Access: right sided TDC-  No obvious infection    01/08/2021,10:07 AM  LOS: 7 days

## 2021-01-08 NOTE — Progress Notes (Signed)
eLink Physician-Brief Progress Note Patient Name: Theresa Russell DOB: Oct 05, 1960 MRN: [id number]   Date of Service  01/08/2021  HPI/Events of Note  Bedside RN asking if she should give patient scheduled 12 units of Lantus as patient's blood sugar is 91, yesterday blood sugar was 126 prior to the dose and dropped to 90 following the dose.  eICU Interventions  RN instructed to give 6 units of Lantus and monitor blood sugar overnight.        Frederik Pear 01/08/2021, 9:39 PM

## 2021-01-08 NOTE — Plan of Care (Signed)
  Problem: Clinical Measurements: Goal: Cardiovascular complication will be avoided Outcome: Progressing   Problem: Nutrition: Goal: Adequate nutrition will be maintained Outcome: Progressing   Problem: Coping: Goal: Level of anxiety will decrease Outcome: Progressing   Problem: Safety: Goal: Ability to remain free from injury will improve Outcome: Progressing   Problem: Clinical Measurements: Goal: Respiratory complications will improve Outcome: Not Progressing

## 2021-01-08 NOTE — Progress Notes (Signed)
Patient placed on BIPAP for night rest.  Tolerating well at this time.

## 2021-01-08 NOTE — TOC Progression Note (Signed)
Transition of Care [facility]) - Progression Note    Patient Details  Name: Theresa Russell MRN: [id number] Date of Birth: Jun 14, 1961  Transition of Care [facility]) CM/SW [place], RN Phone Number: 01/08/2021, 12:14 PM  Clinical Narrative:    Case management spoke with Anderson Malta, [facility] with [facility] and they have an available admission bed for this patient today.  I spoke with Dr. Erin Fulling and the patient will not be medically ready to transfer for the next 1-2 days.  I updated Anderson Malta, CM at [facility] and she will continue to follow the patient for possible admission this week.  CM and MSW will continue to follow the patient for transitions of care needs.   Expected Discharge Plan: Long Term Acute Care (LTAC) Barriers to Discharge: Continued Medical Work up  Expected Discharge Plan and Services Expected Discharge Plan: Long Term Acute Care (LTAC) In-house Referral: Clinical Social Work Discharge Planning Services: CM Consult Post Acute Care Choice: Long Term Acute Care (LTAC) Living arrangements for the past 2 months: [place]                 DME Arranged: N/A DME Agency: NA       HH Arranged: NA HH Agency: NA         Social Determinants of Health (SDOH) Interventions    Readmission Risk Interventions No flowsheet data found.

## 2021-01-08 NOTE — Progress Notes (Signed)
Pt was refusing turns this AM due to uncontrolled pain.  All wound dressings changed on this shift with wet to dry dressings.  Pt unable to tolerate all wound care at one time, broke wound care up into 2 sessions.

## 2021-01-08 NOTE — Progress Notes (Signed)
NAME:  Theresa Russell, MRN:  [id number], DOB:  04/10/1961, LOS: 7 ADMISSION DATE:  01/01/2021, CONSULTATION DATE:  5/10 REFERRING MD:  Jerelene Redden CHIEF COMPLAINT:  Sepsis  History of Present Illness:  [name] is a 60 y.o. F who was transferred from [facility] with sepsis.  She has a pertinent past medical history of COPD [facility]), HTN, systolic HF, afib depression, DM II, ESRD (Tu,Th,S dialysis), known wounds to her lower extremities, groin, glutes, and coccyx due to complicated hospital stay from February to April of this year due to necrotizing fasciitis with Morganella, E. Coli, and MRSA and later complicated by klebsiella pneumonia and klebsiella bacteremia. She was treated with a myriad of antibiotics including zosyn, clindamycin, cefepime, Zyvox, doxycycline and meropenem and multiple debridements. She reportedly does not ambulate and was residing in a SNF.  Per chart review and discussion with her son Theresa Dash, Ms. Terhaar was transferred from her SNF to [facility] on 5/9 with concerns of altered mental status. She was found to be hypotensive and hypoxic on assesment. He last dialysis was Saturday where is was reported that 2L was pulled off. She was started on levophed, 3L of IVF was given, cultures were drawn, and she was started on Linezolid and Meropenem. A head CT was completed which was read by outside radiology as negative for acute process. A CT chest, ABD, and pelvis was obtained which was negative for PE but concerning for "crazy paving" with extensive infilitrate of both lungs, air fluid collection of the left anterior perineum and left medial thigh measuring 5.5 cm which was concerning for an abcess, pelvic dermoid/teratoma. She was found to have a leukocytosis of 13.1, lactate of 2.1 which increased to 2.5, and troponin I of 265. During transfer to [facility] on 5/10 she received 246m LR bolus and found to be normotensive, confused and off of pressors at time of  arrival.  PCCM was consulted for admission for sepsis.  Pertinent  Medical History  COPD (2LNC) HTN Depression DM II ESRD (Tu,Th,S dialysis), Afib with RVR Nec fasciitis s/p surgical debridement with MRSA, Morganella and E. Coli infection. Klebsiella bacteremia/pneumonia Chronic systolic heart failure Known wounds to her lower extremities, groin, glutes, and coccyx.  Significant Hospital Events: Including procedures, antibiotic start and stop dates in addition to other pertinent events    [facility]   3/17 Drainage of inner thigh abcess  4/27 RT IJ Temp cath removal, RT IJ  Tunneled HD cath insertion.  5/9 Presented to [facility] with AMS. Found to be hypotensive. Head CT  negative for acute process. CT with/without chest, ABD, and pelvis was obtained which was negative for PE but concerning for "crazy paving" with extensive infilitrate of both lungs, air fluid collection of the left anterior perineum and left medial thigh measuring 5.5 cm which was concerning for an abcess, pelvic dermoid/teratoma. Started on Linezolid and Meropenem  5/10 [facility] performed I&D of left medial thigh wound with copious purulent discharge.  5/12 Episode of Afib with RVR to 150s during dialysis, switched from levophed to phenylephrine and amio started.  5/13 OR surgery for debridement  5/14 remained intubated after surgery  5/15 extubated   Antibiotics Linezolid 5/10>> Meropenem 5/11>>  Interim History / Subjective:   Overnight, extremely somnolent, ABG unchanged. Thought to be most likely due to uremia. During HD, pressures were soft so started on phenylephrine. CT head also ordered.  This morning seen on NIV, resting comfortably.  Awake and alert, following commands and conversational. Endorsing leg and back pain.   Objective   Blood pressure (!) 108/56, pulse (!) 119, temperature (!) 97.4 F (36.3 C), temperature source Axillary, resp.  rate 12, height 5' 6"  (1.676 m), weight 102 kg, SpO2 93 %.    Vent Mode: BIPAP FiO2 (%):  [40 %-50 %] 50 % Set Rate:  [10 bmp-14 bmp] 14 bmp PEEP:  [5 cmH20-6 cmH20] 5 cmH20 Pressure Support:  [6 cmH20-10 cmH20] 10 cmH20   Intake/Output Summary (Last 24 hours) at 01/08/2021 0750 Last data filed at 01/08/2021 0445 Gross per 24 hour  Intake 1516.73 ml  Output 831 ml  Net 685.73 ml   Filed Weights   01/05/21 0247 01/06/21 0258 01/07/21 0316  Weight: 99.8 kg 99.8 kg 102 kg    Examination: General:no acute distress, on NIV HEENT: [place]/AT, moist mucous membranes, sclera anicteric Neuro:following commands, awake and alert CV: irregularly irregular, tachycardic, s1s2, no murmurs  PULM:diminished. No wheezing  GI: soft, non-tender, non-distended, BS+  Extremities: warm, b/l LE +1 pitting edema Skin: multiple wounds covered in bandages   Pertinent labs- K 3.2  Labs/imaging that I have personally reviewed  (right click and "Reselect all SmartList Selections" daily)  5/17 BMP, CBC   Resolved Hospital Problem list     Assessment & Plan:  [name] is a 60 y.o. F w/ pmhx of COPD (2LNC), HTN, systolic HF, afib depression, DM II, ESRD (Tu,Th,S dialysis), known wounds to her lower extremities, groin, glutes, and coccyx due to complicated hospital stay from February to April of this year due to necrotizing fasciitis who was transferred from [facility] with septic shock.  Sepsis due to soft tissue wounds/infection - Continue Meropenem, linezolid stopped as no MRSA per ID  - s/p debridement with Gen Surgery 5/13 - resumed phenylephrine overnight due to soft pressures during HD   Acute on Chronic Respiratory Failure with Hypoxia  COPD (2LNC at baseline). 5/9 CT negative for PE but concerning for "crazy paving" with extensive infilitrate of both lungs.  - Extubated 5/15 - Continue Abx as discussed above - scheduled duonebs - BiPAP qhs   Acute Metabolic  Encephalopathy Suspect secondary to sepsis. 5/9 head ct negative. - Mentation much improved today, likely in the setting of narcotics  - Promote normal sleep/wake cycle. Avoid restraints as able. - Minimize sedating medications - Avoid further narcotic use as this acutely worsened patient's mentation and respiratory status  Left medial thigh/Perineum abscess s/p debridement 5/10 and 5/13 Per [facility] CT on 5/9. Surgery performed I&D 5/10 early AM with copious purulent discahrge and potential communication with left medial leg. History of necrotizing fasciitis. Wound cultures previously grew morganella, e.coli, MRSA.  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 Per chart review was previously receiving dakins treatment to PI -Wound care consulted and following  Afib with RVR Hx Systolic Heart Failure - HR in the 130-140s, will bolus amiodarone and increase dose  - If no improvement can consider digoxin - Continue telemetry - Hold off on restarting beta blocker due to starting midodrine   Hx of HTN Hypotensive currently, requiring pressor support - Resume home meds when appropriate - Currently on midodrine   Anemia of Chronic Disease (Baseline 8-9) -Transfuse pRBC if Hgb <7 -Trend CBC  ESRD (Tu,Th,S dialysis) -HD per Nephrology -Trend CMP, MG, Phos  Moderate Malnutrition related to Chronic Illness -Nutrition following -Cortrak in place Nutrition recommend: - Vital 1.5 @ 60 ml/hr (1440 ml/day) - ProSource TF 45 ml QID  DM II - Continue SSI - Lantus 12 units BID - Blood Glucose goal 140-180  Depression - Zoloft 74m daily  Pelvic dermoid/teratoma Per [facility] CT on 5/9 - Follow up with PCP  Best practice (right click and "Reselect all SmartList Selections" daily)  Diet:  Tube Feed  Pain/Anxiety/Delirium  protocol (if indicated): No VAP protocol (if indicated): Not indicated DVT prophylaxis: Subcutaneous Heparin GI prophylaxis: PPI Glucose control:  SSI Yes and Basal insulin Yes Central venous access:  Yes, and it is still needed Arterial line:  N/A Foley:  N/A Mobility:  bed rest  PT consulted: Yes Last date of multidisciplinary goals of care discussion  Code Status:  full code Disposition: ICU  Labs   CBC: Recent Labs  Lab 01/01/21 2039 01/02/21 0130 01/03/21 0607 01/04/21 0135 01/05/21 0240 01/06/21 0253 01/07/21 0311 01/07/21 2007 01/07/21 2242  WBC 16.2*   < > 14.1* 10.7* 9.5 8.3 14.4*  --   --   NEUTROABS 15.2*  --   --   --   --   --   --   --   --   HGB 8.8*   < > 9.3* 8.4* 9.6* 7.8* 7.7* 9.2* 9.2*  HCT 29.3*   < > 31.5* 28.1* 31.5* 25.2* 25.0* 27.0* 27.0*  MCV 94.5   < > 94.6 92.1 91.6 92.3 92.3  --   --   PLT 158   < > 179 123* 88* 86* 85*  --   --    < > = values in this interval not displayed.    Basic Metabolic Panel: Recent Labs  Lab 01/01/21 2039 01/02/21 0130 01/03/21 [id number]/12/22 2248 01/04/21 0135 01/05/21 0701 01/06/21 0253 01/07/21 0311 01/07/21 2007 01/07/21 2242 01/08/21 0508  NA 133* 133*   < > 132* 134* 132* 132* 129* 130* 130* 134*  K 5.6* 5.2*   < > 3.5 3.8 4.0 3.3* 3.5 3.7 3.7 3.2*  CL 98 99   < > 96* 98 98 98 96*  --   --  98  CO2 20* 21*   < > 25 26 17* 20* 21*  --   --  25  GLUCOSE 162* 177*   < > 144* 110* 252* 355* 258*  --   --  128*  BUN 55* 56*   < > 26* 27* 35* 39* 50*  --   --  24*  CREATININE 4.18* 4.32*   < > 2.41* 2.66* 3.29* 3.32* 3.92*  --   --  2.26*  CALCIUM 7.9* 8.0*   < > 7.7* 7.7* 7.3* 7.4* 7.6*  --   --  7.6*  MG 1.9 1.9  --  1.9  --  1.7 2.1  --   --   --   --   PHOS 6.1* 6.5*  --   --   --  5.0* 5.3*  --   --   --   --    < > = values in this interval not displayed.   GFR: Estimated Creatinine Clearance: 33.1 mL/min (A) (by C-G formula based on SCr of 2.26 mg/dL (H)). Recent Labs  Lab 01/01/21 2039  01/02/21 0130 01/02/21 0960405/12/22 0540905/13/22 0135 01/05/21 0240 01/06/21 0253 01/07/21 0311  PROCALCITON 1.32  --   --   --   --   --   --   --  WBC 16.2*   < >  --    < > 10.7* 9.5 8.3 14.4*  LATICACIDVEN 1.5  --  2.1*  --   --   --   --   --    < > = values in this interval not displayed.    Liver Function Tests: Recent Labs  Lab 01/01/21 2039  AST 16  ALT 7  ALKPHOS 198*  BILITOT 0.7  PROT 6.2*  ALBUMIN 2.3*   No results for input(s): LIPASE, AMYLASE in the last 168 hours. Recent Labs  Lab 01/05/21 1600  AMMONIA 45*    ABG    Component Value Date/Time   PHART 7.216 (L) 01/07/2021 2242   PCO2ART 50.3 (H) 01/07/2021 2242   PO2ART 96 01/07/2021 2242   HCO3 20.6 01/07/2021 2242   TCO2 22 01/07/2021 2242   ACIDBASEDEF 7.0 (H) 01/07/2021 2242   O2SAT 96.0 01/07/2021 2242     Coagulation Profile: No results for input(s): INR, PROTIME in the last 168 hours.  Cardiac Enzymes: No results for input(s): CKTOTAL, CKMB, CKMBINDEX, TROPONINI in the last 168 hours.  HbA1C: Hgb A1c MFr Bld  Date/Time Value Ref Range Status  01/02/2021 01:30 AM 5.8 (H) 4.8 - 5.6 % Final    Comment:    (NOTE) Pre diabetes:          5.7%-6.4%  Diabetes:              >6.4%  Glycemic control for   <7.0% adults with diabetes   10/19/2020 03:58 AM 6.8 (H) 4.8 - 5.6 % Final    Comment:    (NOTE) Pre diabetes:          5.7%-6.4%  Diabetes:              >6.4%  Glycemic control for   <7.0% adults with diabetes     CBG: Recent Labs  Lab 01/07/21 1532 01/07/21 1927 01/07/21 2331 01/08/21 0316 01/08/21 [place], DO PGY-2 IMTS

## 2021-01-08 NOTE — Progress Notes (Signed)
PCCM interval progress note:  Pt evaluated for increasing somnolence with Dr. Lynetta Mare overnight, pt will briefly open eyes to sternal rub and then falls back asleep. PH 7.2, pCO2 52 similar to prior, protecting her airway with bipap and no hypoxia.  Pt awaiting HD shortly and expect that somnolence is most likely related to uremia.    HD tonight, head CT and then re-evaluate for intubation if not improved.   Otilio Carpen Ketan Renz, PA-C

## 2021-01-09 ENCOUNTER — Other Ambulatory Visit (HOSPITAL_COMMUNITY): Payer: Self-pay

## 2021-01-09 ENCOUNTER — Inpatient Hospital Stay
Admission: AD | Admit: 2021-01-09 | Discharge: 2021-01-23 | Disposition: E | Payer: Self-pay | Source: Other Acute Inpatient Hospital | Attending: Internal Medicine | Admitting: Internal Medicine

## 2021-01-09 DIAGNOSIS — J189 Pneumonia, unspecified organism: Secondary | ICD-10-CM

## 2021-01-09 DIAGNOSIS — Z4659 Encounter for fitting and adjustment of other gastrointestinal appliance and device: Secondary | ICD-10-CM

## 2021-01-09 DIAGNOSIS — T17908A Unspecified foreign body in respiratory tract, part unspecified causing other injury, initial encounter: Secondary | ICD-10-CM

## 2021-01-09 DIAGNOSIS — J811 Chronic pulmonary edema: Secondary | ICD-10-CM

## 2021-01-09 LAB — MAGNESIUM: Magnesium: 2.3 mg/dL (ref 1.7–2.4)

## 2021-01-09 LAB — RENAL FUNCTION PANEL
Albumin: 1.8 g/dL — ABNORMAL LOW (ref 3.5–5.0)
Anion gap: 15 (ref 5–15)
BUN: 37 mg/dL — ABNORMAL HIGH (ref 6–20)
CO2: 23 mmol/L (ref 22–32)
Calcium: 7.4 mg/dL — ABNORMAL LOW (ref 8.9–10.3)
Chloride: 91 mmol/L — ABNORMAL LOW (ref 98–111)
Creatinine, Ser: 2.95 mg/dL — ABNORMAL HIGH (ref 0.44–1.00)
GFR, Estimated: 18 mL/min — ABNORMAL LOW (ref >60)
Glucose, Bld: 191 mg/dL — ABNORMAL HIGH (ref 70–99)
Phosphorus: 4.2 mg/dL (ref 2.5–4.6)
Potassium: 3.9 mmol/L (ref 3.5–5.1)
Sodium: 129 mmol/L — ABNORMAL LOW (ref 135–145)

## 2021-01-09 LAB — GLUCOSE, CAPILLARY
Glucose-Capillary: 133 mg/dL — ABNORMAL HIGH (ref 70–99)
Glucose-Capillary: 63 mg/dL — ABNORMAL LOW (ref 70–99)
Glucose-Capillary: 261 mg/dL — ABNORMAL HIGH (ref 70–99)
Glucose-Capillary: 168 mg/dL — ABNORMAL HIGH (ref 70–99)
Glucose-Capillary: 67 mg/dL — ABNORMAL LOW (ref 70–99)
Glucose-Capillary: 73 mg/dL (ref 70–99)

## 2021-01-09 MED ORDER — INSULIN GLARGINE 100 UNIT/ML ~~LOC~~ SOLN
12.0000 [IU] | Freq: Every day | SUBCUTANEOUS | Status: DC
  Administered 2021-01-09: 12 [IU] via SUBCUTANEOUS
  Filled 2021-01-09: qty 0.12

## 2021-01-09 MED ORDER — MIDODRINE HCL 5 MG PO TABS
10.0000 mg | ORAL_TABLET | Freq: Three times a day (TID) | ORAL | Status: DC
  Administered 2021-01-09: 10 mg
  Filled 2021-01-09: qty 2

## 2021-01-09 MED ORDER — AMIODARONE HCL IN DEXTROSE 360-4.14 MG/200ML-% IV SOLN: 30.0000 mg/h | INTRAVENOUS | Status: AC

## 2021-01-09 MED ORDER — MIDODRINE HCL 5 MG PO TABS
10.0000 mg | ORAL_TABLET | Freq: Three times a day (TID) | ORAL | Status: DC
  Administered 2021-01-09 (×2): 10 mg via ORAL
  Filled 2021-01-09 (×2): qty 2

## 2021-01-09 MED ORDER — COLLAGENASE 250 UNIT/GM EX OINT
TOPICAL_OINTMENT | Freq: Two times a day (BID) | CUTANEOUS | Status: DC
  Filled 2021-01-09: qty 30

## 2021-01-09 MED ORDER — SODIUM CHLORIDE 0.9 % IV SOLN: 500.0000 mg | INTRAVENOUS | 0 refills | Status: AC

## 2021-01-09 MED ORDER — IPRATROPIUM-ALBUTEROL 0.5-2.5 (3) MG/3ML IN SOLN: 3.0000 mL | Freq: Two times a day (BID) | RESPIRATORY_TRACT | Status: DC

## 2021-01-09 MED ORDER — INSULIN GLARGINE 100 UNIT/ML ~~LOC~~ SOLN: 12.0000 [IU] | Freq: Every day | SUBCUTANEOUS | Status: DC

## 2021-01-09 MED ORDER — INSULIN GLARGINE 100 UNIT/ML ~~LOC~~ SOLN
12.0000 [IU] | Freq: Every day | SUBCUTANEOUS | 11 refills | Status: AC
  Filled 2021-01-09: qty 10, 83d supply, fill #0

## 2021-01-09 MED ORDER — HYDROMORPHONE HCL 1 MG/ML IJ SOLN: 1.0000 mg | Freq: Two times a day (BID) | INTRAMUSCULAR | 0 refills | Status: AC | PRN

## 2021-01-09 MED ORDER — APIXABAN 5 MG PO TABS: 5.0000 mg | ORAL_TABLET | Freq: Two times a day (BID) | ORAL | Status: AC

## 2021-01-09 MED ORDER — HYDROMORPHONE HCL 1 MG/ML IJ SOLN
1.0000 mg | Freq: Two times a day (BID) | INTRAMUSCULAR | Status: DC | PRN
  Administered 2021-01-09: 1 mg via INTRAVENOUS
  Filled 2021-01-09: qty 1

## 2021-01-09 MED ORDER — FERROUS SULFATE 325 (65 FE) MG PO TABS: 325.0000 mg | ORAL_TABLET | Freq: Every day | ORAL | 3 refills | Status: AC

## 2021-01-09 MED ORDER — APIXABAN 5 MG PO TABS
5.0000 mg | ORAL_TABLET | Freq: Two times a day (BID) | ORAL | Status: DC
  Administered 2021-01-09: 5 mg
  Filled 2021-01-09: qty 1

## 2021-01-09 MED ORDER — MIDODRINE HCL 10 MG PO TABS: 10.0000 mg | ORAL_TABLET | Freq: Three times a day (TID) | ORAL | Status: AC

## 2021-01-09 MED ORDER — AMIODARONE HCL IN DEXTROSE 360-4.14 MG/200ML-% IV SOLN: 60.0000 mg/h | INTRAVENOUS | Status: DC

## 2021-01-09 NOTE — Evaluation (Signed)
Clinical/Bedside Swallow Evaluation Patient Details  Name: Theresa Russell MRN: [id number] Date of Birth: 10/01/1960  Today's Date: 01/03/2021 Time: SLP Start Time (ACUTE ONLY): 3329 SLP Stop Time (ACUTE ONLY): 0934 SLP Time Calculation (min) (ACUTE ONLY): 9 min  Past Medical History: History reviewed. No pertinent past medical history. Past Surgical History:  Past Surgical History:  Procedure Laterality Date  . INCISION AND DRAINAGE ABSCESS N/A 01/04/2021   Procedure: INCISION AND DRAINAGE OF PERIRECTAL ABSCESS;  Surgeon: Jovita Kussmaul, MD;  Location: [facility];  Service: General;  Laterality: N/A;  . IR FLUORO GUIDE CV LINE RIGHT  11/30/2020  . IR FLUORO GUIDE CV LINE RIGHT  12/19/2020  . IR REMOVAL TUN CV CATH W/O FL  11/27/2020  . IR US GUIDE VASC ACCESS RIGHT  11/30/2020  . IR US GUIDE VASC ACCESS RIGHT  12/19/2020   HPI:  60 year old woman with COPD, chronic hypoxemic respiratory failure on 2L O2, hypertension, DMII, ESRD on HD and CHF  with recent admission for septic shock secondary to necrotizing fasciitis which was complicated by klebsiella pneumonia and bacteremia who is admitted from a SNF with recent mental status changes found to be in septic shock from perineal and lower extremity abscesses. Head CT  negative for acute process. CT with/without chest, ABD, and pelvis was obtained which was negative for PE but concerning for "crazy paving" with extensive infilitrate of both lungs, underwent surgery on 5/13 for debridement, remained intubated until 5/15. Then has had a Cortrak, but no swallow screen. Pt [facility]; no report is available, but images reviewed and shows normal swallowing function. Pt bedbound at baseline.   Assessment / Plan / Recommendation Clinical Impression  Pt demonstrates no signs of aspiration. She was able to drink consecutively, though she did not complete a 3 oz water swallow due to inability to fully comprehend the  instructions. Overall she consumed about 6 oz of water. Mastication was adequate, but pt was distracted by pain during intake. Likely she will not tolerate fully upright position for meals. Recommend regular diet and thin liquids with assist as needed. SLP Visit Diagnosis: Dysphagia, unspecified (R13.10)    Aspiration Risk  Mild aspiration risk    Diet Recommendation Regular;Thin liquid   Liquid Administration via: Cup;Straw Medication Administration: Whole meds with liquid Supervision: Staff to assist with self feeding Compensations: Slow rate;Small sips/bites Postural Changes: Other (Comment) (may need to eat partially reclined, sidelying)    Other  Recommendations Oral Care Recommendations: Oral care BID   Follow up Recommendations None      Frequency and Duration            Prognosis        Swallow Study   General HPI: 60 year old woman with COPD, chronic hypoxemic respiratory failure on 2L O2, hypertension, DMII, ESRD on HD and CHF  with recent admission for septic shock secondary to necrotizing fasciitis which was complicated by klebsiella pneumonia and bacteremia who is admitted from a SNF with recent mental status changes found to be in septic shock from perineal and lower extremity abscesses. Head CT  negative for acute process. CT with/without chest, ABD, and pelvis was obtained which was negative for PE but concerning for "crazy paving" with extensive infilitrate of both lungs, underwent surgery on 5/13 for debridement, remained intubated until 5/15. Then has had a Cortrak, but no swallow screen. Pt [facility]; no report  is available, but images reviewed and shows normal swallowing function. Pt bedbound at baseline. Type of Study: Bedside Swallow Evaluation Diet Prior to this Study: NPO;NG Tube Temperature Spikes Noted: No Respiratory Status: Nasal cannula History of Recent Intubation: Yes Length of Intubations (days): 3 days Date  extubated: 01/06/21 Behavior/Cognition: Alert;Cooperative Oral Cavity Assessment: Within Functional Limits Oral Care Completed by SLP: No Oral Cavity - Dentition: Adequate natural dentition Vision: Functional for self-feeding Self-Feeding Abilities: Needs assist Patient Positioning: Upright in bed;Partially reclined Baseline Vocal Quality: Normal Volitional Cough: Strong    Oral/Motor/Sensory Function Overall Oral Motor/Sensory Function: Within functional limits   Ice Chips     Thin Liquid Thin Liquid: Within functional limits Presentation: Straw    Nectar Thick Nectar Thick Liquid: Not tested   Honey Thick Honey Thick Liquid: Not tested   Puree Puree: Within functional limits   Solid     Solid: Within functional limits     Herbie [place], MA [place] Pager [phone] Office [phone]  Lynann Beaver 01/05/2021,9:37 AM

## 2021-01-09 NOTE — Discharge Summary (Addendum)
Physician Discharge Summary         Patient ID: KODIE PICK MRN: [id number] DOB/AGE: February 12, 1961 60 y.o.  Admit date: 01/01/2021 Discharge date: 01/18/2021  Discharge Diagnoses:   1.  Sepsis secondary to soft tissue wounds and left medial thigh/perineum abscess 2.  Acute on chronic respiratory failure with hypoxia 3.  Acute metabolic encephalopathy 4.  Bilateral lower extremity, left groin right gluteal and coccyx wounds 5.  Atrial fibrillation with RVR 6.  History of systolic heart failure 7.  Hypertension   Discharge summary    [name] is a 60 y.o. F w/ pmhx of COPD (2LNC), HTN, systolic HF, afib depression, DM II, ESRD (Tu,Th,S dialysis), known wounds to her lower extremities, groin, glutes, and coccyx due to complicated hospital stay from February to April of this year due to necrotizing fasciitis who was transferred from [facility] with septic shock requiring pressor support.    She had an I&D of left medial thigh wound with copious purulent discharge on day of admission at 5/10 and debridement 5/13.  She was started on linezolid and meropenem, narrowed to meropenem with end date 5/20.She had episode of A. fib with RVR to the 150s during dialysis on 5/12 and was switched Levophed to phenylephrine amnioinfusion was started.  She remained intubated from 5/13-5/15 extubated successfully to nasal cannula.  Plan to continue meropenem until 5/20, 7 days from debridement.  She remains in A. fib with RVR on amnio infusion.  Her systolic blood pressures are sustaining in the low 100s off of pressor support.  She was started on midodrine.  Discharge Plan by Active Problems    Sepsis due to soft tissue wounds/infection - Continue Meropenem for 7 days post debridement, stop date 5/20/222  - off pressor support, systolic's in the low 952W  Acute on Chronic Respiratory Failure with Hypoxia  - COPD (2LNC at baseline) - 5/9 CT negative for PE but concerning for "crazy  paving" with extensive infilitrate of both lungs  - Extubated 5/15, now on 6L nasal cannula  - Continue Abx as discussed above - scheduled duonebs - BiPAP qhs   Acute Metabolic Encephalopathy Suspect secondary to sepsis. 5/9 head ct negative. - Mentation much improved today, likely in the setting of narcotics  - Promote normal sleep/wake cycle. Avoid restraints as able. - Minimize sedating medications - Resumed home pain medications - Start IV dilaudid 1 mg BID PRN for wound dressing changes with careful observation of mentation  Left medial thigh/Perineum abscess s/p debridement 5/10 and 5/13 Per [facility] CT on 5/9. Surgery performed I&D 5/10 with copious purulent discahrge and potential communication with left medial leg. History of necrotizing fasciitis. Wound cultures previously grew morganella, e.coli, MRSA.  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 Off pressor support. - Resume home meds when appropriate - Continue midodrine 10 mg TID   Anemia of Chronic  Disease (Baseline 8-9) -Transfuse pRBC if Hgb <7 -Trend CBC  ESRD (Tu,Th,S dialysis) -HD per Nephrology -Trend CMP, MG, Phos  Moderate Malnutrition related to Chronic Illness -Nutrition following -Cortrak in place Nutrition recommend: - Vital 1.5 @ 60 ml/hr (1440 ml/day) - ProSource TF 45 ml QID  DM  II - Continue SSI - Lantus 12 units daily - Blood Glucose goal 140-180  Depression - Zoloft 50mg  daily  Pelvic dermoid/teratoma Per [facility] CT on 5/9 - Follow up with PCP    Significant Hospital tests/ studies   Head CT negative for acute process  CT with/without chest, ABD, and pelvis was obtained which was negative for PE but concerning for "crazy paving" with extensive infilitrate of both lungs, air fluid collection of the left anterior perineum and left medial thigh measuring 5.5 cm which was concerning for an abcess, pelvic dermoid/teratoma.   Procedures   - 5/10 I&D of left medial thigh wound - 5/13 Debridement, remained intubated after surgery - 5/15 extubated  Culture data/antimicrobials    Recent Results (from the past 240 hour(s))  MRSA PCR Screening     Status: None   Collection Time: 01/01/21  7:04 PM   Specimen: Nasopharyngeal  Result Value Ref Range Status   MRSA by PCR NEGATIVE NEGATIVE Final    Comment:        The GeneXpert MRSA Assay (FDA approved for NASAL specimens only), is one component of a comprehensive MRSA colonization surveillance program. It is not intended to diagnose MRSA infection nor to guide or monitor treatment for MRSA infections. Performed at [facility], [place] [address] [place], [place] [address]   Culture, blood (Routine X 2) w Reflex to ID Panel     Status: None   Collection Time: 01/01/21  9:51 PM   Specimen: BLOOD LEFT FOREARM  Result Value Ref Range Status   Specimen Description BLOOD LEFT FOREARM  Final   Special Requests   Final    BOTTLES DRAWN AEROBIC AND ANAEROBIC Blood Culture adequate volume   Culture   Final    NO GROWTH 5 DAYS Performed at [facility], [place] [address] [place], [place] [address]    Report Status 01/06/2021 FINAL  Final  Culture, blood (Routine X 2) w Reflex to ID Panel     Status: None   Collection Time: 01/01/21 10:01 PM   Specimen: BLOOD  Result Value Ref Range Status    Specimen Description BLOOD RIGHT ANTECUBITAL  Final   Special Requests   Final    BOTTLES DRAWN AEROBIC AND ANAEROBIC Blood Culture adequate volume   Culture   Final    NO GROWTH 5 DAYS Performed at [facility], [place] [address], [place], [place] [address]    Report Status 01/06/2021 FINAL  Final  Aerobic/Anaerobic Culture w Gram Stain (surgical/deep wound)     Status: Abnormal   Collection Time: 01/04/21 12:37 PM   Specimen: PATH Other; Tissue  Result Value Ref Range Status   Specimen Description ABSCESS  Final   Special Requests PERIRECTAL  Final   Gram Stain   Final    RARE WBC PRESENT, PREDOMINANTLY PMN NO ORGANISMS SEEN    Culture (A)  Final    MULTIPLE ORGANISMS PRESENT, NONE PREDOMINANT NO STAPHYLOCOCCUS AUREUS ISOLATED NO GROUP A STREP (S.PYOGENES) ISOLATED RARE BACTEROIDES OVATUS BETA LACTAMASE POSITIVE Performed at [facility], [place] [address] [place],  [address]    Report Status 01/07/2021 FINAL  Final  Consults    ID  General surgery  Discharge Exam: BP 103/79 (BP Location: Right Arm)   Pulse (!) 111   Temp 98.2 F (36.8 C) (Oral)   Resp 10   Ht 5\' 6"  (1.676 m)   Wt 105.3 kg   SpO2 99%   BMI 37.47 kg/m   General:no acute distress, chronically ill appearing  HEENT: [place]/AT, moist mucous membranes, sclera anicteric Neuro:following commands, awake and alert CV: irregularly irregular, tachycardic, s1s2, no murmurs PULM:diminished. No wheezing, on supplemental oxygen GI: soft, non-tender, non-distended, BS+ Extremities: warm, b/l LE +1 pitting edema Skin: multiple wounds covered in bandages Labs at discharge   Lab Results  Component Value Date   CREATININE 2.95 (H) 01/19/2021   BUN 37 (H) 01/14/2021   NA 129 (L) 12/28/2020   K 3.9 12/27/2020   CL 91 (L) 01/08/2021   CO2 23 12/26/2020   Lab Results  Component Value Date   WBC 14.4 (H) 01/07/2021   HGB 9.2 (L) 01/07/2021   HCT 27.0 (L) 01/07/2021   MCV 92.3  01/07/2021   PLT 85 (L) 01/07/2021   Lab Results  Component Value Date   ALT 7 01/01/2021   AST 16 01/01/2021   ALKPHOS 198 (H) 01/01/2021   BILITOT 0.7 01/01/2021   Lab Results  Component Value Date   INR 1.2 12/19/2020   INR 1.3 (H) 10/21/2020    Current radiological studies    No results found.    Disposition:    Discharge disposition: 03-Skilled Nursing Facility       Discharge Instructions    Call MD for:  difficulty breathing, headache or visual disturbances   Complete by: As directed    Call MD for:  persistant nausea and vomiting   Complete by: As directed    Call MD for:  redness, tenderness, or signs of infection (pain, swelling, redness, odor or green/yellow discharge around incision site)   Complete by: As directed    Call MD for:  severe uncontrolled pain   Complete by: As directed    Call MD for:  temperature >100.4   Complete by: As directed    Diet - low sodium heart healthy   Complete by: As directed    Discharge wound care:   Complete by: As directed    Continue BID dressing changes and hydrotherapy.   Increase activity slowly   Complete by: As directed       Allergies as of 01/15/2021      Reactions   Atorvastatin Other (See Comments)   Unknown reaction   Canagliflozin Other (See Comments)   Unknown    Levofloxacin Other (See Comments)   Unknown   Lisinopril Other (See Comments)   Unknown   Other    Orange dye    Vancomycin Other (See Comments)   Unknown   Hydrochlorothiazide Nausea Only      Medication List    STOP taking these medications   amLODipine 10 MG tablet Commonly known as: NORVASC   diltiazem 30 MG tablet Commonly known as: CARDIZEM   metoprolol succinate 25 MG 24 hr tablet Commonly known as: TOPROL-XL     TAKE these medications   acetaminophen 500 MG tablet Commonly known as: TYLENOL Take 1,000 mg by mouth 3 (three) times daily. What changed: Another medication with the same name was removed. Continue  taking this medication, and follow the directions you see here.   ALPRAZolam 0.5 MG tablet Commonly known as: XANAX Take 0.5 mg by mouth  every 8 (eight) hours as needed for anxiety.   amiodarone 360-4.14 MG/200ML-% Soln Commonly known as: NEXTERONE PREMIX Inject 60 mg/hr into the vein continuous.   apixaban 5 MG Tabs tablet Commonly known as: ELIQUIS Place 1 tablet (5 mg total) into feeding tube 2 (two) times daily.   cholecalciferol 25 MCG (1000 UNIT) tablet Commonly known as: VITAMIN D3 Take 1,000 Units by mouth daily.   ferrous sulfate 325 (65 FE) MG tablet Take 1 tablet (325 mg total) by mouth daily with breakfast. Resume after infection resolves Start taking on: Jan 21, 2021 What changed:   additional instructions  These instructions start on Jan 21, 2021. If you are unsure what to do until then, ask your doctor or other care provider.   fiber Pack packet Take 1 packet by mouth in the morning, at noon, and at bedtime.   folic acid 1 MG tablet Commonly known as: FOLVITE Take 1 mg by mouth daily.   gabapentin 300 MG capsule Commonly known as: NEURONTIN Take 300 mg by mouth 3 (three) times daily.   HYDROmorphone 1 MG/ML injection Commonly known as: DILAUDID Inject 1 mL (1 mg total) into the vein 2 (two) times daily as needed for moderate pain (prior to wound dressing changes).   insulin glargine 100 UNIT/ML injection Commonly known as: LANTUS Inject 0.12 mLs (12 Units total) into the skin daily. Start taking on: Jan 10, 2021   insulin lispro 100 UNIT/ML injection Commonly known as: HUMALOG Inject 2-12 Units into the skin in the morning, at noon, in the evening, and at bedtime. Sliding scale : 200-250 = 2 units 251-300 = 4 units 301-350 = 6 units 351-400 = 8 units 401- 450 = 10 units  451-500 = 12 units If above 500 call Md   Lactulose 20 GM/30ML Soln Take 30 mLs by mouth 2 (two) times daily.   lidocaine 5 % Commonly known as: LIDODERM Place 1 patch  onto the skin daily. Remove & Discard patch within 12 hours or as directed by MD   magnesium hydroxide 400 MG/5ML suspension Commonly known as: MILK OF MAGNESIA Take 30 mLs by mouth daily as needed for mild constipation.   meropenem 500 mg in sodium chloride 0.9 % 100 mL Inject 500 mg into the vein daily for 2 days. Start taking on: Jan 10, 2021   midodrine 10 MG tablet Commonly known as: PROAMATINE Place 1 tablet (10 mg total) into feeding tube 3 (three) times daily with meals.   NEPHRO-VITE PO Take 1 mg by mouth daily.   NON FORMULARY Take 1 Dose by mouth at bedtime. Bi pap   NON FORMULARY Apply 1 application topically 2 (two) times daily. Sama sensitive lotion 1%   oxyCODONE 5 MG immediate release tablet Commonly known as: Oxy IR/ROXICODONE Take 5 mg by mouth every 4 (four) hours as needed for severe pain.   pantoprazole 40 MG tablet Commonly known as: PROTONIX Take 40 mg by mouth daily.   predniSONE 10 MG tablet Commonly known as: DELTASONE Take 10 mg by mouth daily.   sertraline 50 MG tablet Commonly known as: ZOLOFT Take 50 mg by mouth daily.            Discharge Care Instructions  (From admission, onward)         Start     Ordered   12/26/2020 0000  Discharge wound care:       Comments: Continue BID dressing changes and hydrotherapy.   01/04/2021 1524  Follow-up appointment   Follow up with PCP   Discharge Condition:    stable  Physician Statement:   The Patient was personally examined, the discharge assessment and plan has been personally reviewed and I agree with ACNP Babcock's assessment and plan. 35 minutes of time have been dedicated to discharge assessment, planning and discharge instructions.   Signed: Mike Craze 01/04/2021, 3:24 PM

## 2021-01-09 NOTE — Progress Notes (Signed)
NAME:  Theresa Russell, MRN:  [id number], DOB:  1961/06/23, LOS: 8 ADMISSION DATE:  01/01/2021, CONSULTATION DATE:  5/10 REFERRING MD:  Jerelene Redden CHIEF COMPLAINT:  Sepsis  History of Present Illness:  Theresa Russell is a 60 y.o. F who was transferred from [facility] with sepsis.  She has a pertinent past medical history of COPD [facility]), HTN, systolic HF, afib depression, DM II, ESRD (Tu,Th,S dialysis), known wounds to her lower extremities, groin, glutes, and coccyx due to complicated hospital stay from February to April of this year due to necrotizing fasciitis with Morganella, E. Coli, and MRSA and later complicated by klebsiella pneumonia and klebsiella bacteremia. She was treated with a myriad of antibiotics including zosyn, clindamycin, cefepime, Zyvox, doxycycline and meropenem and multiple debridements. She reportedly does not ambulate and was residing in a SNF.  Per chart review and discussion with her son Ulice Dash, Theresa Russell was transferred from her SNF to [facility] on 5/9 with concerns of altered mental status. She was found to be hypotensive and hypoxic on assesment. He last dialysis was Saturday where is was reported that 2L was pulled off. She was started on levophed, 3L of IVF was given, cultures were drawn, and she was started on Linezolid and Meropenem. A head CT was completed which was read by outside radiology as negative for acute process. A CT chest, ABD, and pelvis was obtained which was negative for PE but concerning for "crazy paving" with extensive infilitrate of both lungs, air fluid collection of the left anterior perineum and left medial thigh measuring 5.5 cm which was concerning for an abcess, pelvic dermoid/teratoma. She was found to have a leukocytosis of 13.1, lactate of 2.1 which increased to 2.5, and troponin I of 265. During transfer to [facility] on 5/10 she received 274m LR bolus and found to be normotensive, confused and off of pressors at time of  arrival.  PCCM was consulted for admission for sepsis.  Pertinent  Medical History  COPD (2LNC) HTN Depression DM II ESRD (Tu,Th,S dialysis), Afib with RVR Nec fasciitis s/p surgical debridement with MRSA, Morganella and E. Coli infection. Klebsiella bacteremia/pneumonia Chronic systolic heart failure Known wounds to her lower extremities, groin, glutes, and coccyx.  Significant Hospital Events: Including procedures, antibiotic start and stop dates in addition to other pertinent events    [facility]   3/17 Drainage of inner thigh abcess  4/27 RT IJ Temp cath removal, RT IJ  Tunneled HD cath insertion.  5/9 Presented to [facility] with AMS. Found to be hypotensive. Head CT  negative for acute process. CT with/without chest, ABD, and pelvis was obtained which was negative for PE but concerning for "crazy paving" with extensive infilitrate of both lungs, air fluid collection of the left anterior perineum and left medial thigh measuring 5.5 cm which was concerning for an abcess, pelvic dermoid/teratoma. Started on Linezolid and Meropenem  [facility] performed [facility]&D of left medial thigh wound with copious purulent discharge.  5/12 Episode of Afib with RVR to 150s during dialysis, switched from levophed to phenylephrine and amio started.  5/13 OR surgery for debridement  5/14 remained intubated after surgery  5/15 extubated   Antibiotics Linezolid 5/10>>5/16 Meropenem 5/11>>  Interim History / Subjective:   No overnight events. This morning seen wake and alert, following commands and conversational. Endorsing leg and back pain.   Objective   Blood pressure (!) 81/72, pulse (!) 132, temperature 98.9 F (37.2  C), temperature source Oral, resp. rate 18, height 5' 6"  (1.676 m), weight 105.3 kg, SpO2 90 %.    Vent Mode: BIPAP FiO2 (%):  [40 %] 40 %   Intake/Output Summary (Last 24 hours) at 01/15/2021 0803 Last data filed at  12/27/2020 0600 Gross per 24 hour  Intake 2732.09 ml  Output 75 ml  Net 2657.09 ml   Filed Weights   01/06/21 0258 01/07/21 0316 01/19/2021 0437  Weight: 99.8 kg 102 kg 105.3 kg    Examination: General:no acute distress, chronically ill appearing  HEENT: [place]/AT, moist mucous membranes, sclera anicteric Neuro:following commands, awake and alert CV: irregularly irregular, tachycardic, s1s2, no murmurs  PULM:diminished. No wheezing, on supplemental oxygen GI: soft, non-tender, non-distended, BS+  Extremities: warm, b/l LE +1 pitting edema Skin: multiple wounds covered in bandages   Pertinent labs- Renal function panel, Mag- pending BG 261  Labs/imaging that I have personally reviewed  (right click and "Reselect Russell SmartList Selections" daily)  Morning labs pending   Resolved Hospital Problem list     Assessment & Plan:  Theresa Russell is a 60 y.o. F w/ pmhx of COPD (2LNC), HTN, systolic HF, afib depression, DM II, ESRD (Tu,Th,S dialysis), known wounds to her lower extremities, groin, glutes, and coccyx due to complicated hospital stay from February to April of this year due to necrotizing fasciitis who was transferred from [facility] with septic shock.  Sepsis due to soft tissue wounds/infection - Continue Meropenem for 7 days post debridement, stop date 5/20/222  - s/p debridement with Gen Surgery 5/13 - off pressor support, systolic's in the low 093A  Acute on Chronic Respiratory Failure with Hypoxia  - COPD (2LNC at baseline) - 5/9 CT negative for PE but concerning for "crazy paving" with extensive infilitrate of both lungs  - Extubated 5/15, now on 6L nasal cannula  - Continue Abx as discussed above - scheduled duonebs - BiPAP qhs   Acute Metabolic Encephalopathy Suspect secondary to sepsis. 5/9 head ct negative. - Mentation much improved today, likely in the setting of narcotics  - Promote normal sleep/wake cycle. Avoid restraints as able. - Minimize  sedating medications - Resumed home pain medications - Start IV dilaudid 1 mg BID PRN for wound dressing changes with careful observation of mentation  Left medial thigh/Perineum abscess s/p debridement 5/10 and 5/13 Per [facility] CT on 5/9. Surgery performed I&D 5/10 with copious purulent discahrge and potential communication with left medial leg. History of necrotizing fasciitis. Wound cultures previously grew morganella, e.coli, MRSA.  - General surgery consulted; wound debridement in OR on 5/13 - Continue dressing changes - Antibiotics as discussed above - Pain medications as above   Wounds- BL lower extremities, L groin Stage 4 PI- RT glute and coccyx Per chart review was previously receiving dakins treatment to PI -Wound care consulted and following  Afib with RVR Hx Systolic Heart Failure - HR improving, in the 110-120s - Continue amiodarone infusion - If no improvement can consider digoxin - Continue telemetry - Hold off on restarting beta blocker due to starting midodrine   Hx of HTN Systolics in the low 355D. Off pressor support. - Resume home meds when appropriate - Continue midodrine   Anemia of Chronic Disease (Baseline 8-9) -Transfuse pRBC if Hgb <7 -Trend CBC  ESRD (Tu,Th,S dialysis) -HD per Nephrology -Trend CMP, MG, Phos  Moderate Malnutrition related to Chronic Illness -Nutrition following -Cortrak in place Nutrition recommend: - Vital 1.5 @ 60 ml/hr (1440 ml/day) -  ProSource TF 45 ml QID  DM II - Continue SSI - Lantus 12 units BID - Blood Glucose goal 140-180  Depression - Zoloft 78m daily  Pelvic dermoid/teratoma Per [facility] CT on 5/9 - Follow up with PCP  Best practice (right click and "Reselect Russell SmartList Selections" daily)  Diet:  Tube Feed  Pain/Anxiety/Delirium protocol (if indicated): No VAP protocol (if indicated): Not indicated DVT prophylaxis: Subcutaneous Heparin GI prophylaxis: PPI Glucose control:  SSI Yes and  Basal insulin Yes Central venous access:  Yes, and it is still needed Arterial line:  N/A Foley:  N/A Mobility:  bed rest  PT consulted: Yes Last date of multidisciplinary goals of care discussion n/a Code Status:  full code Disposition: ICU  Labs   CBC: Recent Labs  Lab 01/03/21 0607 01/04/21 0135 01/05/21 0240 01/06/21 0253 01/07/21 0311 01/07/21 2007 01/07/21 2242  WBC 14.1* 10.7* 9.5 8.3 14.4*  --   --   HGB 9.3* 8.4* 9.6* 7.8* 7.7* 9.2* 9.2*  HCT 31.5* 28.1* 31.5* 25.2* 25.0* 27.0* 27.0*  MCV 94.6 92.1 91.6 92.3 92.3  --   --   PLT 179 123* 88* 86* 85*  --   --     Basic Metabolic Panel: Recent Labs  Lab 01/03/21 2248 01/04/21 0135 01/05/21 0701 01/06/21 0253 01/07/21 0311 01/07/21 2007 01/07/21 2242 01/08/21 0508 01/08/21 1044  NA 132* 134* 132* 132* 129* 130* 130* 134*  --   K 3.5 3.8 4.0 3.3* 3.5 3.7 3.7 3.2*  --   CL 96* 98 98 98 96*  --   --  98  --   CO2 25 26 17* 20* 21*  --   --  25  --   GLUCOSE 144* 110* 252* 355* 258*  --   --  128*  --   BUN 26* 27* 35* 39* 50*  --   --  24*  --   CREATININE 2.41* 2.66* 3.29* 3.32* 3.92*  --   --  2.26*  --   CALCIUM 7.7* 7.7* 7.3* 7.4* 7.6*  --   --  7.6*  --   MG 1.9  --  1.7 2.1  --   --   --   --  1.3*  PHOS  --   --  5.0* 5.3*  --   --   --   --   --    GFR: Estimated Creatinine Clearance: 33.7 mL/min (A) (by C-G formula based on SCr of 2.26 mg/dL (H)). Recent Labs  Lab 01/02/21 0853 01/03/21 0607 01/04/21 0135 01/05/21 0240 01/06/21 0253 01/07/21 0311  WBC  --    < > 10.7* 9.5 8.3 14.4*  LATICACIDVEN 2.1*  --   --   --   --   --    < > = values in this interval not displayed.    Liver Function Tests: No results for input(s): AST, ALT, ALKPHOS, BILITOT, PROT, ALBUMIN in the last 168 hours. No results for input(s): LIPASE, AMYLASE in the last 168 hours. Recent Labs  Lab 01/05/21 1600  AMMONIA 45*    ABG    Component Value Date/Time   PHART 7.216 (L) 01/07/2021 2242   PCO2ART 50.3 (H)  01/07/2021 2242   PO2ART 96 01/07/2021 2242   HCO3 20.6 01/07/2021 2242   TCO2 22 01/07/2021 2242   ACIDBASEDEF 7.0 (H) 01/07/2021 2242   O2SAT 96.0 01/07/2021 2242     Coagulation Profile: No results for input(s): INR, PROTIME in the last 168 hours.  Cardiac Enzymes: No results for input(s): CKTOTAL, CKMB, CKMBINDEX, TROPONINI in the last 168 hours.  HbA1C: Hgb A1c MFr Bld  Date/Time Value Ref Range Status  01/02/2021 01:30 AM 5.8 (H) 4.8 - 5.6 % Final    Comment:    (NOTE) Pre diabetes:          5.7%-6.4%  Diabetes:              >6.4%  Glycemic control for   <7.0% adults with diabetes   10/19/2020 03:58 AM 6.8 (H) 4.8 - 5.6 % Final    Comment:    (NOTE) Pre diabetes:          5.7%-6.4%  Diabetes:              >6.4%  Glycemic control for   <7.0% adults with diabetes     CBG: Recent Labs  Lab 01/08/21 1507 01/08/21 1954 01/08/21 2319 12/27/2020 0308 01/07/2021 [place], DO PGY-2 IMTS

## 2021-01-09 NOTE — Progress Notes (Signed)
Physical Therapy Wound Treatment Patient Details  Name: Theresa Russell MRN: [id number] Date of Birth: 06-21-1961  Today's Date: 01/22/2021 Time: 0388-8280 Time Calculation (min): 28 min  Subjective  Subjective Assessment Subjective: Able to get pain meds this session which appeared to make her sleepy. Reporting nausea but agreeable to hydrotherapy. Patient and Family Stated Goals: None stated Date of Onset:  (Unsure) Prior Treatments: Dressing changes  Pain Score:  BP allowed for premedication this session. Overall pt tolerated pulsed lavage and debridement with min complaints of pain.   Wound Assessment  Pressure Injury 01/01/21 Sacrum Medial Unstageable - Full thickness tissue loss in which the base of the injury is covered by slough (yellow, tan, gray, green or brown) and/or eschar (tan, brown or black) in the wound bed. (Active)  Dressing Type ABD;Barrier Film (skin prep);Gauze (Comment);Moist to moist 12/28/2020 1459  Dressing Changed;Clean;Dry;Intact 12/27/2020 1459  Dressing Change Frequency Daily 01/08/2021 1459  State of Healing Early/partial granulation 12/30/2020 1459  Site / Wound Assessment Black;Yellow;Pink 01/07/2021 1459  % Wound base Red or Granulating 60% 01/08/21 1321  % Wound base Yellow/Fibrinous Exudate 25% 01/08/21 1321  % Wound base Black/Eschar 15% 01/08/21 1321  % Wound base Other/Granulation Tissue (Comment) 0% 01/08/21 1321  Peri-wound Assessment Intact;Bleeding 01/14/2021 1459  Wound Length (cm) 10.2 cm 01/07/21 1000  Wound Width (cm) 10.9 cm 01/07/21 1000  Wound Depth (cm) 2.5 cm 01/07/21 1000  Wound Surface Area (cm^2) 111.18 cm^2 01/07/21 1000  Wound Volume (cm^3) 277.95 cm^3 01/07/21 1000  Tunneling (cm) 0 01/07/21 1223  Undermining (cm) Up to 3.5 cm throughout the circumference of the wound 01/07/21 1000  Margins Unattached edges (unapproximated) 01/12/2021 1459  Drainage Amount Moderate 01/12/2021 1459  Drainage Description Sanguineous 01/07/2021 1459   Treatment Debridement (Selective);Hydrotherapy (Pulse lavage);Packing (Saline gauze) 12/29/2020 1459      Hydrotherapy Pulsed lavage therapy - wound location: Sacrum Pulsed Lavage with Suction (psi): 12 psi Pulsed Lavage with Suction - Normal Saline Used: 1000 mL Pulsed Lavage Tip: Tip with splash shield Selective Debridement Selective Debridement - Location: Sacrum Selective Debridement - Tools Used: Forceps,Scissors Selective Debridement - Tissue Removed: Eschar and yellow nonviable tissue    Wound Assessment and Plan  Wound Therapy - Assess/Plan/Recommendations Wound Therapy - Clinical Statement: PA finishing up other dressing changes when hydrotherapy arrived. Pt was able to get pain meds this session and seemed to tolerate better without complaints of pain specifically from the hydro treatment. Minimal debridement able to be performed to keep pt comfortable, however I am satisfied with the amount of necrotic tissue I was able to remove. This patient will benefit from continued hydrotherapy for selective removal of unviable tissue, to decrease bioburden, and promote wound bed healing. Wound Therapy - Functional Problem List: Global weakness in the setting of almost 3 month span of hospitilizations and SNF stay. Factors Delaying/Impairing Wound Healing: Diabetes Mellitus,Immobility,Multiple medical problems,Infection - systemic/local Hydrotherapy Plan: Debridement,Dressing change,Patient/family education,Pulsatile lavage with suction Wound Therapy - Frequency: 6X / week Wound Therapy - Follow Up Recommendations: dressing changes by RN  Wound Therapy Goals- Improve the function of patient's integumentary system by progressing the wound(s) through the phases of wound healing (inflammation - proliferation - remodeling) by: Wound Therapy Goals - Improve the function of patient's integumentary system by progressing the wound(s) through the phases of wound healing by: Decrease Necrotic Tissue  to: 20% Decrease Necrotic Tissue - Progress: Progressing toward goal Increase Granulation Tissue to: 80% Increase Granulation Tissue - Progress: Progressing toward goal Goals/treatment plan/discharge plan  were made with and agreed upon by patient/family: Yes Time For Goal Achievement: 7 days Wound Therapy - Potential for Goals: Good  Goals will be updated until maximal potential achieved or discharge criteria met.  Discharge criteria: when goals achieved, discharge from hospital, MD decision/surgical intervention, no progress towards goals, refusal/missing three consecutive treatments without notification or medical reason.  GP     Charges PT Wound Care Charges $Wound Debridement up to 20 cm: < or equal to 20 cm $ Wound Debridement each add'l 20 sqcm: 2 $PT PLS Gun and Tip: 1 Supply $PT Hydrotherapy Visit: 1 Visit       Thelma Comp 01/17/2021, 3:09 PM   Rolinda Roan, PT, DPT Acute Rehabilitation Services Pager: [phone] Office: [phone]

## 2021-01-09 NOTE — TOC Progression Note (Addendum)
Transition of Care [facility]) - Progression Note    Patient Details  Name: Theresa Russell MRN: [id number] Date of Birth: 12/11/1960  Transition [facility] CM/SW Contact  Ella Bodo, RN Phone Number: 01/03/2021, [phone] Clinical Narrative:  Per Arley Phenix, admissions liaison with [facility], HD bed available at facility, pending medical stability.  MD agreeable to transfer back to facility. Pt's son coming to hospital to discuss transfer with patient; plan transfer back to [facility] pending patient's approval.     Addendum: 01/08/2021 1520 Patient's son agreeable to her return to [facility] for continued wound and respiratory care.  Plan discharge to [place] later today.    Expected Discharge Plan: Long Term Acute Care (LTAC) Barriers to Discharge: Continued Medical Work up  Expected Discharge Plan and Services Expected Discharge Plan: Long Term Acute Care (LTAC) In-house Referral: Clinical Social Work Discharge Planning Services: CM Consult Post Acute Care Choice: Long Term Acute Care (LTAC) Living arrangements for the past 2 months: [place]                 DME Arranged: N/A DME Agency: NA       HH Arranged: NA HH Agency: NA         Social Determinants of Health (SDOH) Interventions    Readmission Risk Interventions No flowsheet data found.  Reinaldo Raddle, RN, BSN  Trauma/Neuro ICU Case Manager [phone]

## 2021-01-09 NOTE — Discharge Instructions (Signed)

## 2021-01-09 NOTE — Progress Notes (Signed)
Subjective: Patient states she feels fairly well today.  Having some pain intermittently associated with her wounds.  Blood pressures but not currently on phenylephrine.  Objective Vital signs in last 24 hours: Vitals:   12/25/2020 0700 01/14/2021 0725 12/30/2020 0800 01/05/2021 0900  BP: (!) 81/72  102/75 102/83  Pulse: (!) 132  (!) 117 (!) 121  Resp: 18  17 18   Temp:  98.9 F (37.2 C)    TempSrc:  Oral    SpO2: (!) 89%  99% 100%  Weight:      Height:       Weight change:   Intake/Output Summary (Last 24 hours) at 01/05/2021 1023 Last data filed at 12/29/2020 0600 Gross per 24 hour  Intake 2484.71 ml  Output 75 ml  Net 2409.71 ml   Dialyzes at [place]  TTS 4 hours  EDW 93 kg. HD Bath 2/2.5, Dialyzer unknown, Heparin yes- 2000 per hour. Access TDC.BFR 350/dfr 500 epogen 8000 per tx, iron weekly    Assessment/Plan: 60 year old BF with multiple medical issues including ESRD and chronic wounds.  Complicated by sepsis  1 wounds/sepsis-  S/p a brief debridement per surgery upon admission-  s/p more extensive debridement 5/13-  requiring extensive antibiotic.  These wounds are significant and will be difficult to heal 2 ESRD: normally TTS at [facility] via TDC-underwent dialysis Monday night/Tuesday morning.  Had hypotension requiring phenylephrine.  Plan to try dialysis again tomorrow.  Continue midodrine 3 hypotension:  Blood pressure has been low likely related to sepsis.  Ultrafiltration limited by hypotension.  On midodrine 3 times daily 10 mg 4. Anemia of ESRD: chronic issue, exacerbated with surgery-  Will cont ESA-  No iron due to infections 5. 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  Phos OK -  6.  Hyponatremia: Minimal.  Managed with dialysis 7.  DM2: Complicated by hyperglycemia.  Management per primary team    Theresa Russell    Labs: Basic Metabolic Panel: Recent Labs  Lab 01/05/21 0701 01/06/21 0253  01/07/21 0311 01/07/21 2007 01/07/21 2242 01/08/21 0508 01/19/2021 0800  NA 132* 132* 129*   < > 130* 134* 129*  K 4.0 3.3* 3.5   < > 3.7 3.2* 3.9  CL 98 98 96*  --   --  98 91*  CO2 17* 20* 21*  --   --  25 23  GLUCOSE 252* 355* 258*  --   --  128* 191*  BUN 35* 39* 50*  --   --  24* 37*  CREATININE 3.29* 3.32* 3.92*  --   --  2.26* 2.95*  CALCIUM 7.3* 7.4* 7.6*  --   --  7.6* 7.4*  PHOS 5.0* 5.3*  --   --   --   --  4.2   < > = values in this interval not displayed.   Liver Function Tests: Recent Labs  Lab 01/15/2021 0800  ALBUMIN 1.8*   No results for input(s): LIPASE, AMYLASE in the last 168 hours. Recent Labs  Lab 01/05/21 1600  AMMONIA 45*   CBC: Recent Labs  Lab 01/03/21 0607 01/04/21 0135 01/05/21 0240 01/06/21 0253 01/07/21 0311 01/07/21 2007 01/07/21 2242  WBC 14.1* 10.7* 9.5 8.3 14.4*  --   --   HGB 9.3* 8.4* 9.6* 7.8* 7.7* 9.2* 9.2*  HCT 31.5* 28.1* 31.5* 25.2* 25.0* 27.0* 27.0*  MCV 94.6 92.1 91.6 92.3 92.3  --   --  PLT 179 123* 88* 86* 85*  --   --    Cardiac Enzymes: No results for input(s): CKTOTAL, CKMB, CKMBINDEX, TROPONINI in the last 168 hours. CBG: Recent Labs  Lab 01/08/21 1954 01/08/21 2319 01/13/2021 0308 12/24/2020 0724 01/04/2021 0806  GLUCAP 91 99 133* 63* 261*    Iron Studies:  No results for input(s): IRON, TIBC, TRANSFERRIN, FERRITIN in the last 72 hours. Studies/Results: No results found. Medications: Infusions: . sodium chloride 10 mL/hr at 01/05/21 2000  . amiodarone 60 mg/hr (12/29/2020 0600)  . feeding supplement (VITAL 1.5 CAL) 1,000 mL (01/07/21 2200)  . meropenem (MERREM) IV Stopped (01/08/21 1716)  . phenylephrine (NEO-SYNEPHRINE) Adult infusion Stopped (01/08/21 1321)    Scheduled Medications: . acetaminophen (TYLENOL) oral liquid 160 mg/5 mL  650 mg Per Tube Q6H  . chlorhexidine gluconate (MEDLINE KIT)  15 mL Mouth Rinse BID  . Chlorhexidine Gluconate Cloth  6 each Topical Q0600  . [START ON 01/10/2021]  darbepoetin (ARANESP) injection - DIALYSIS  100 mcg Intravenous Q Thu-HD  . feeding supplement (PROSource TF)  45 mL Per Tube TID  . heparin  5,000 Units Subcutaneous Q8H  . insulin aspart  0-20 Units Subcutaneous Q4H  . insulin glargine  12 Units Subcutaneous BID  . ipratropium-albuterol  3 mL Nebulization TID  . midodrine  10 mg Oral TID WC  . multivitamin  1 tablet Per Tube QHS  . pantoprazole sodium  40 mg Per Tube QHS  . predniSONE  10 mg Per Tube Q breakfast  . sertraline  50 mg Per Tube Daily  . sodium chloride flush  10-40 mL Intracatheter Q12H    have reviewed scheduled and prn medications.  Vitals:   01/20/2021 0800 01/04/2021 0900  BP: 102/75 102/83  Pulse: (!) 117 (!) 121  Resp: 17 18  Temp:    SpO2: 99% 100%     Physical Exam: General: lying in bed, nad Heart: tachy Lungs: bilateral chest rise, no iwob Abdomen: soft, non tender Extremities: some pitting edema-  Wounds present and stable Dialysis Access: right sided TDC-  No obvious infection    01/06/2021,10:23 AM  LOS: 8 days

## 2021-01-09 NOTE — Plan of Care (Signed)

## 2021-01-09 NOTE — Progress Notes (Addendum)
[facility] Progress Note  5 Days Post-Op  Subjective: CC-  Having a lot of pain from wounds, did not tolerate hydrotherapy yesterday very well.  Remains off pressors, but SBP 90s  Objective: Vital signs in last 24 hours: Temp:  [97.9 F (36.6 C)-99.4 F (37.4 C)] 98.9 F (37.2 C) (05/18 0725) Pulse Rate:  [105-151] 121 (05/18 0900) Resp:  [0-24] 18 (05/18 0900) BP: (74-146)/(19-120) 102/83 (05/18 0900) SpO2:  [70 %-100 %] 100 % (05/18 0900) FiO2 (%):  [40 %] 40 % (05/17 2350) Weight:  [105.3 kg] 105.3 kg (05/18 0437) Last BM Date: 01/01/2021  Intake/Output from previous day: 05/17 0701 - 05/18 0700 In: 2883.8 [I.V.:824.6; NG/GT:1590; IV Piggyback:469.2] Out: 175 [Stool:175] Intake/Output this shift: Total I/O In: 10 [I.V.:10] Out: -   PE: Gen:  Alert, NAD Card:  tachy 110-120s Pulm:  rate and effort normal Abd: Soft, non-tender Right thigh/groin wound:   Left thigh/groin with two wounds as below -- proximal would roughly 2-3 cm in diameter and deep, tunnels posteriorly towards the labia; tissue beefy red without purulent drainage. Medial thigh wound >90% granulation tissue, tunnels cephalad towards proximal thigh wound AND posteriorly towards the perineum, no purulent drainage     Wound of the left gluteal sulcus and perineum with a clean wound base. Tunnels towards the left labia. Slow bloody oozing, no purulent drainage   Sacral wound - wound base is mostly fibrinous/necrotic tissue. No purulent drainage. Visible and palpable sacral bone. Wound edge with slow bloody oozing     Lab Results:  Recent Labs    01/07/21 0311 01/07/21 2007 01/07/21 2242  WBC 14.4*  --   --   HGB 7.7* 9.2* 9.2*  HCT 25.0* 27.0* 27.0*  PLT 85*  --   --    BMET Recent Labs    01/08/21 0508 12/28/2020 0800  NA 134* 129*  K 3.2* 3.9  CL 98 91*  CO2 25 23  GLUCOSE 128* 191*  BUN 24* 37*  CREATININE 2.26* 2.95*  CALCIUM 7.6* 7.4*   PT/INR No results for  input(s): LABPROT, INR in the last 72 hours. CMP     Component Value Date/Time   NA 129 (L) 01/14/2021 0800   K 3.9 01/15/2021 0800   CL 91 (L) 01/20/2021 0800   CO2 23 01/18/2021 0800   GLUCOSE 191 (H) 01/22/2021 0800   BUN 37 (H) 01/12/2021 0800   CREATININE 2.95 (H) 12/29/2020 0800   CALCIUM 7.4 (L) 12/29/2020 0800   PROT 6.2 (L) 01/01/2021 2039   ALBUMIN 1.8 (L) 01/15/2021 0800   AST 16 01/01/2021 2039   ALT 7 01/01/2021 2039   ALKPHOS 198 (H) 01/01/2021 2039   BILITOT 0.7 01/01/2021 2039   GFRNONAA 18 (L) 01/02/2021 0800   Lipase  No results found for: LIPASE     Studies/Results: No results found.  Anti-infectives: Anti-infectives (From admission, onward)   Start     Dose/Rate Route Frequency Ordered Stop   01/02/21 1500  meropenem (MERREM) 500 mg in sodium chloride 0.9 % 100 mL IVPB        500 mg 200 mL/hr over 30 Minutes Intravenous Every 24 hours 01/01/21 2010 01/11/21 2359   01/01/21 2200  linezolid (ZYVOX) IVPB 600 mg  Status:  Discontinued        600 mg 300 mL/hr over 60 Minutes Intravenous Every 12 hours 01/01/21 2010 01/07/21 0937       Assessment/Plan Acute on chronic respiratory failure with hypoxia, Hx COPD on  home O2 Acute metabolic encephalopathy A.fib CHF Anemia of chronic disease ESRD Malnutrition DMII Depression Pelvic dermoid/teratoma  ---per primary team---  Perirectal soft tissue infection -S/p bedside I&D 5/10 Dr. Kae Heller  -S/P I&D in OR 5/13 Dr. Marlou Starks - wounds noted to be fairly clean in OR.  - continue BID moist-to-dry dressing changesto left thigh/groin, left gluteal, and right groin wounds. Will add santyl to right groin wound - Sacral wound stable. Continue BID dressing changes and hydrotherapy.   FEN: renal diet, TF ID: merrem 5/11>>5/20, linezolid 5/10>>5/16 VTE: SQH   LOS: 8 days    Wellington Hampshire, [facility] 12/26/2020, 11:16 AM Please see Amion for pager number during day hours  7:00am-4:30pm

## 2021-01-09 NOTE — Progress Notes (Signed)
Renal Navigator faxed records to [facility] [phone] to provide update that patient will discharge to [facility] today.   Alphonzo Cruise, [place] Renal Navigator [phone]

## 2021-01-09 NOTE — Plan of Care (Signed)
  Problem: Education: Goal: Knowledge of General Education information will improve Description: Including pain rating scale, medication(s)/side effects and non-pharmacologic comfort measures Outcome: Progressing   Problem: Health Behavior/Discharge Planning: Goal: Ability to manage health-related needs will improve Outcome: Progressing   Problem: Clinical Measurements: Goal: Ability to maintain clinical measurements within normal limits will improve Outcome: Progressing Goal: Will remain free from infection Outcome: Progressing Goal: Respiratory complications will improve Outcome: Progressing Goal: Cardiovascular complication will be avoided Outcome: Progressing   Problem: Activity: Goal: Risk for activity intolerance will decrease Outcome: Progressing   Problem: Elimination: Goal: Will not experience complications related to bowel motility Outcome: Progressing Goal: Will not experience complications related to urinary retention Outcome: Progressing   Problem: Safety: Goal: Ability to remain free from injury will improve Outcome: Progressing   Problem: Clinical Measurements: Goal: Diagnostic test results will improve Outcome: Not Progressing   Problem: Nutrition: Goal: Adequate nutrition will be maintained Outcome: Not Progressing   Problem: Coping: Goal: Level of anxiety will decrease Outcome: Not Progressing   Problem: Pain Managment: Goal: General experience of comfort will improve Outcome: Not Progressing   Problem: Skin Integrity: Goal: Risk for impaired skin integrity will decrease Outcome: Not Progressing

## 2021-01-10 ENCOUNTER — Other Ambulatory Visit (HOSPITAL_COMMUNITY): Payer: Self-pay

## 2021-01-10 LAB — BLOOD GAS, ARTERIAL
Acid-base deficit: 3.6 mmol/L — ABNORMAL HIGH (ref 0.0–2.0)
Acid-base deficit: 4.7 mmol/L — ABNORMAL HIGH (ref 0.0–2.0)
Bicarbonate: 23.3 mmol/L (ref 20.0–28.0)
Bicarbonate: 21.3 mmol/L (ref 20.0–28.0)
FIO2: 28
FIO2: 28
O2 Saturation: 94.3 %
O2 Saturation: 89.8 %
Patient temperature: 36.9
Patient temperature: 36.1
pCO2 arterial: 60.6 mmHg — ABNORMAL HIGH (ref 32.0–48.0)
pCO2 arterial: 47.3 mmHg (ref 32.0–48.0)
pH, Arterial: 7.208 — ABNORMAL LOW (ref 7.350–7.450)
pH, Arterial: 7.27 — ABNORMAL LOW (ref 7.350–7.450)
pO2, Arterial: 87 mmHg (ref 83.0–108.0)
pO2, Arterial: 63.7 mmHg — ABNORMAL LOW (ref 83.0–108.0)

## 2021-01-10 LAB — CBC WITH DIFFERENTIAL/PLATELET
Abs Immature Granulocytes: 0.27 10*3/uL — ABNORMAL HIGH (ref 0.00–0.07)
Basophils Absolute: 0 10*3/uL (ref 0.0–0.1)
Basophils Relative: 0 %
Eosinophils Absolute: 0 10*3/uL (ref 0.0–0.5)
Eosinophils Relative: 0 %
HCT: 22.3 % — ABNORMAL LOW (ref 36.0–46.0)
Hemoglobin: 6.9 g/dL — CL (ref 12.0–15.0)
Immature Granulocytes: 1 %
Lymphocytes Relative: 5 %
Lymphs Abs: 1.4 10*3/uL (ref 0.7–4.0)
MCH: 28.3 pg (ref 26.0–34.0)
MCHC: 30.9 g/dL (ref 30.0–36.0)
MCV: 91.4 fL (ref 80.0–100.0)
Monocytes Absolute: 1 10*3/uL (ref 0.1–1.0)
Monocytes Relative: 4 %
Neutro Abs: 22.8 10*3/uL — ABNORMAL HIGH (ref 1.7–7.7)
Neutrophils Relative %: 90 %
Platelets: 61 10*3/uL — ABNORMAL LOW (ref 150–400)
RBC: 2.44 MIL/uL — ABNORMAL LOW (ref 3.87–5.11)
RDW: 17.5 % — ABNORMAL HIGH (ref 11.5–15.5)
WBC: 25.5 10*3/uL — ABNORMAL HIGH (ref 4.0–10.5)
nRBC: 0.6 % — ABNORMAL HIGH (ref 0.0–0.2)

## 2021-01-10 LAB — PROTIME-INR
INR: 1.3 — ABNORMAL HIGH (ref 0.8–1.2)
Prothrombin Time: 16.6 seconds — ABNORMAL HIGH (ref 11.4–15.2)

## 2021-01-10 LAB — COMPREHENSIVE METABOLIC PANEL
ALT: 10 U/L (ref 0–44)
AST: 23 U/L (ref 15–41)
Albumin: 1.9 g/dL — ABNORMAL LOW (ref 3.5–5.0)
Alkaline Phosphatase: 345 U/L — ABNORMAL HIGH (ref 38–126)
Anion gap: 12 (ref 5–15)
BUN: 46 mg/dL — ABNORMAL HIGH (ref 6–20)
CO2: 22 mmol/L (ref 22–32)
Calcium: 8.1 mg/dL — ABNORMAL LOW (ref 8.9–10.3)
Chloride: 95 mmol/L — ABNORMAL LOW (ref 98–111)
Creatinine, Ser: 3.37 mg/dL — ABNORMAL HIGH (ref 0.44–1.00)
GFR, Estimated: 15 mL/min — ABNORMAL LOW (ref >60)
Glucose, Bld: <20 mg/dL — CL (ref 70–99)
Potassium: 5.2 mmol/L — ABNORMAL HIGH (ref 3.5–5.1)
Sodium: 129 mmol/L — ABNORMAL LOW (ref 135–145)
Total Bilirubin: 0.5 mg/dL (ref 0.3–1.2)
Total Protein: 5.2 g/dL — ABNORMAL LOW (ref 6.5–8.1)

## 2021-01-10 LAB — PHOSPHORUS: Phosphorus: 6 mg/dL — ABNORMAL HIGH (ref 2.5–4.6)

## 2021-01-10 LAB — PREPARE RBC (CROSSMATCH)

## 2021-01-10 LAB — HEMOGLOBIN A1C
Hgb A1c MFr Bld: 6.7 % — ABNORMAL HIGH (ref 4.8–5.6)
Mean Plasma Glucose: 145.59 mg/dL

## 2021-01-10 LAB — MAGNESIUM: Magnesium: 2.4 mg/dL (ref 1.7–2.4)

## 2021-01-10 LAB — GLUCOSE, RANDOM: Glucose, Bld: 67 mg/dL — ABNORMAL LOW (ref 70–99)

## 2021-01-10 NOTE — Consult Note (Signed)
Referring Physician: A. Laren Everts, MD  Theresa Russell is an 60 y.o. female.                       Chief Complaint: Atrial fibrillation with RVR  HPI: 59 years old black female with PMH of ischiorectal fossa necrotizing fascitis, s/p surgical debridement, Sepsis, metabolic encephalopathy, acute on chronic respiratory failure with hypoxia, anemia of CKD, type 2 DM, COVID 19 infection has atrial fibrillation with RVR. She is on amiodarone drip. Heart rate is controlled in 70's.   Past medical history: As per HPI.   Significant Hospital Events: Including procedures, antibiotic start and stop dates in addition to other pertinent events    [facility]   3/17 Drainage of inner thigh abcess  4/27 RT IJ Temp cath removal, RT IJ  Tunneled HD cath insertion.  5/9 Presented to [place] with AMS. Found to be hypotensive. Head CT  negative for acute process. CT with/without chest, ABD, and pelvis was obtained which was negative for PE but concerning for "crazy paving" with extensive infilitrate of both lungs, air fluid collection of the left anterior perineum and left medial thigh measuring 5.5 cm which was concerning for an abcess, pelvic dermoid/teratoma. Started on Linezolid and Meropenem  5/10 Transferred to [facility]   Past Surgical History:  Procedure Laterality Date  . INCISION AND DRAINAGE ABSCESS N/A 01/04/2021   Procedure: INCISION AND DRAINAGE OF PERIRECTAL ABSCESS;  Surgeon: Jovita Kussmaul, MD;  Location: [facility];  Service: General;  Laterality: N/A;  . IR FLUORO GUIDE CV LINE RIGHT  11/30/2020  . IR FLUORO GUIDE CV LINE RIGHT  12/19/2020  . IR REMOVAL TUN CV CATH W/O FL  11/27/2020  . IR US GUIDE VASC ACCESS RIGHT  11/30/2020  . IR US GUIDE VASC ACCESS RIGHT  12/19/2020    No family history on file. Social History:  has no history on file for tobacco use, alcohol use, and drug use.  Allergies:  Allergies  Allergen Reactions  . Atorvastatin Other (See Comments)     Unknown reaction  . Canagliflozin Other (See Comments)    Unknown   . Levofloxacin Other (See Comments)    Unknown  . Lisinopril Other (See Comments)    Unknown  . Other     Orange dye   . Vancomycin Other (See Comments)    Unknown  . Hydrochlorothiazide Nausea Only    Medications Prior to Admission  Medication Sig Dispense Refill  . acetaminophen (TYLENOL) 500 MG tablet Take 1,000 mg by mouth 3 (three) times daily.    Marland Kitchen ALPRAZolam (XANAX) 0.5 MG tablet Take 0.5 mg by mouth every 8 (eight) hours as needed for anxiety.    Marland Kitchen amiodarone (NEXTERONE PREMIX) 360-4.14 MG/200ML-% SOLN Inject 30 mg/hr into the vein continuous.    Marland Kitchen apixaban (ELIQUIS) 5 MG TABS tablet Place 1 tablet (5 mg total) into feeding tube 2 (two) times daily. 60 tablet   . B Complex-C-Folic Acid (NEPHRO-VITE PO) Take 1 mg by mouth daily.    . cholecalciferol (VITAMIN D3) 25 MCG (1000 UNIT) tablet Take 1,000 Units by mouth daily.    Derrill Memo ON 01/21/2021] ferrous sulfate 325 (65 FE) MG tablet Take 1 tablet (325 mg total) by mouth daily with breakfast. Resume after infection resolves  3  . fiber (NUTRISOURCE FIBER) PACK packet Take 1 packet by mouth in the morning, at noon, and at bedtime.    . folic acid (  FOLVITE) 1 MG tablet Take 1 mg by mouth daily.    Marland Kitchen gabapentin (NEURONTIN) 300 MG capsule Take 300 mg by mouth 3 (three) times daily.    Marland Kitchen HYDROmorphone (DILAUDID) 1 MG/ML injection Inject 1 mL (1 mg total) into the vein 2 (two) times daily as needed for moderate pain (prior to wound dressing changes). 1 mL 0  . insulin glargine (LANTUS) 100 UNIT/ML injection Inject 0.12 mLs (12 Units total) into the skin daily. 10 mL 11  . insulin lispro (HUMALOG) 100 UNIT/ML injection Inject 2-12 Units into the skin in the morning, at noon, in the evening, and at bedtime. Sliding scale : 200-250 = 2 units 251-300 = 4 units 301-350 = 6 units 351-400 = 8 units 401- 450 = 10 units  451-500 = 12 units If above 500 call Md    .  Lactulose 20 GM/30ML SOLN Take 30 mLs by mouth 2 (two) times daily.    Marland Kitchen lidocaine (LIDODERM) 5 % Place 1 patch onto the skin daily. Remove & Discard patch within 12 hours or as directed by MD    . magnesium hydroxide (MILK OF MAGNESIA) 400 MG/5ML suspension Take 30 mLs by mouth daily as needed for mild constipation.    . meropenem 500 mg in sodium chloride 0.9 % 100 mL Inject 500 mg into the vein daily for 2 days. 1000 mg 0  . midodrine (PROAMATINE) 10 MG tablet Place 1 tablet (10 mg total) into feeding tube 3 (three) times daily with meals.    . NON FORMULARY Take 1 Dose by mouth at bedtime. Bi pap    . NON FORMULARY Apply 1 application topically 2 (two) times daily. Sama sensitive lotion 1%    . oxyCODONE (OXY IR/ROXICODONE) 5 MG immediate release tablet Take 5 mg by mouth every 4 (four) hours as needed for severe pain.    . pantoprazole (PROTONIX) 40 MG tablet Take 40 mg by mouth daily.    . predniSONE (DELTASONE) 10 MG tablet Take 10 mg by mouth daily.    . sertraline (ZOLOFT) 50 MG tablet Take 50 mg by mouth daily.      Results for orders placed or performed during the hospital encounter of 01/22/2021 (from the past 48 hour(s))  Comprehensive metabolic panel     Status: Abnormal   Collection Time: 01/10/21  4:08 AM  Result Value Ref Range   Sodium 129 (L) 135 - 145 mmol/L   Potassium 5.2 (H) 3.5 - 5.1 mmol/L   Chloride 95 (L) 98 - 111 mmol/L   CO2 22 22 - 32 mmol/L   Glucose, Bld <20 (LL) 70 - 99 mg/dL    Comment: REPEATED TO VERIFY CRITICAL RESULT CALLED TO, READ BACK BY AND VERIFIED WITH: Hulen Shouts, RN. [phone] 01/10/21 A. MCDOWELL    BUN 46 (H) 6 - 20 mg/dL   Creatinine, Ser 3.37 (H) 0.44 - 1.00 mg/dL   Calcium 8.1 (L) 8.9 - 10.3 mg/dL   Total Protein 5.2 (L) 6.5 - 8.1 g/dL   Albumin 1.9 (L) 3.5 - 5.0 g/dL   AST 23 15 - 41 U/L   ALT 10 0 - 44 U/L   Alkaline Phosphatase 345 (H) 38 - 126 U/L   Total Bilirubin 0.5 0.3 - 1.2 mg/dL   GFR, Estimated 15 (L) >60 mL/min    Comment:  (NOTE) Calculated using the CKD-EPI Creatinine Equation (2021)    Anion gap 12 5 - 15    Comment: Performed at [facility]  Lab, [address], [place], [place] [address]  CBC with Differential/Platelet     Status: Abnormal   Collection Time: 01/10/21  4:08 AM  Result Value Ref Range   WBC 25.5 (H) 4.0 - 10.5 K/uL   RBC 2.44 (L) 3.87 - 5.11 MIL/uL   Hemoglobin 6.9 (LL) 12.0 - 15.0 g/dL    Comment: REPEATED TO VERIFY THIS CRITICAL RESULT HAS VERIFIED AND BEEN CALLED TO S.JEFFERS RN BY KATHERINE MCCORMICK ON 05 19 2022 AT 0746, AND HAS BEEN READ BACK.     HCT 22.3 (L) 36.0 - 46.0 %   MCV 91.4 80.0 - 100.0 fL   MCH 28.3 26.0 - 34.0 pg   MCHC 30.9 30.0 - 36.0 g/dL   RDW 17.5 (H) 11.5 - 15.5 %   Platelets 61 (L) 150 - 400 K/uL    Comment: Immature Platelet Fraction may be clinically indicated, consider ordering this additional test [id number] CONSISTENT WITH PREVIOUS RESULT REPEATED TO VERIFY PLATELET COUNT CONFIRMED BY SMEAR    nRBC 0.6 (H) 0.0 - 0.2 %   Neutrophils Relative % 90 %   Neutro Abs 22.8 (H) 1.7 - 7.7 K/uL   Lymphocytes Relative 5 %   Lymphs Abs 1.4 0.7 - 4.0 K/uL   Monocytes Relative 4 %   Monocytes Absolute 1.0 0.1 - 1.0 K/uL   Eosinophils Relative 0 %   Eosinophils Absolute 0.0 0.0 - 0.5 K/uL   Basophils Relative 0 %   Basophils Absolute 0.0 0.0 - 0.1 K/uL   Immature Granulocytes 1 %   Abs Immature Granulocytes 0.27 (H) 0.00 - 0.07 K/uL    Comment: Performed at [facility], [address], [place], [place] [address]  Protime-INR     Status: Abnormal   Collection Time: 01/10/21  4:08 AM  Result Value Ref Range   Prothrombin Time 16.6 (H) 11.4 - 15.2 seconds   INR 1.3 (H) 0.8 - 1.2    Comment: (NOTE) INR goal varies based on device and disease states. Performed at [facility], [place] [address] [place], [place] [address]   Magnesium     Status: None   Collection Time: 01/10/21  4:08 AM  Result Value Ref Range   Magnesium 2.4 1.7 - 2.4 mg/dL     Comment: Performed at [facility] [address], [place], [place] [address]  Phosphorus     Status: Abnormal   Collection Time: 01/10/21  4:08 AM  Result Value Ref Range   Phosphorus 6.0 (H) 2.5 - 4.6 mg/dL    Comment: Performed at [facility] [address], [place], [place] [address]  Hemoglobin A1c     Status: Abnormal   Collection Time: 01/10/21  4:08 AM  Result Value Ref Range   Hgb A1c MFr Bld 6.7 (H) 4.8 - 5.6 %    Comment: (NOTE) Pre diabetes:          5.7%-6.4%  Diabetes:              >6.4%  Glycemic control for   <7.0% adults with diabetes    Mean Plasma Glucose 145.59 mg/dL    Comment: Performed at [facility] [address] [place], [place] [address]  Type and screen [facility]     Status: None (Preliminary result)   Collection Time: 01/10/21  9:07 AM  Result Value Ref Range   ABO/RH(D) B POS    Antibody Screen NEG    Sample Expiration  01/13/2021,2359    Unit Number [id number]    Blood Component Type RED CELLS,LR    Unit division 00    Status of Unit ISSUED    Transfusion Status OK TO TRANSFUSE    Crossmatch Result      Compatible Performed at [facility], [address] [place], [place] [address]   Blood gas, arterial     Status: Abnormal   Collection Time: 01/10/21  9:13 AM  Result Value Ref Range   FIO2 28.00    pH, Arterial 7.208 (L) 7.350 - 7.450   pCO2 arterial 60.6 (H) 32.0 - 48.0 mmHg   pO2, Arterial 87.0 83.0 - 108.0 mmHg   Bicarbonate 23.3 20.0 - 28.0 mmol/L   Acid-base deficit 3.6 (H) 0.0 - 2.0 mmol/L   O2 Saturation 94.3 %   Patient temperature 36.9    Collection site RIGHT RADIAL    Drawn by A. CUBAS, RT    Sample type ARTERIAL DRAW    Allens test (pass/fail) PASS PASS    Comment: Performed at [facility], [place] [address], [place], [place] [address]  Prepare RBC (crossmatch)     Status: None   Collection Time: 01/10/21  9:24 AM  Result Value Ref Range   Order Confirmation       ORDER PROCESSED BY BLOOD BANK Performed at [facility], [place] [address], [place], [place] [address]   Blood gas, arterial     Status: Abnormal   Collection Time: 01/10/21  2:35 PM  Result Value Ref Range   FIO2 28.00    pH, Arterial 7.270 (L) 7.350 - 7.450   pCO2 arterial 47.3 32.0 - 48.0 mmHg   pO2, Arterial 63.7 (L) 83.0 - 108.0 mmHg   Bicarbonate 21.3 20.0 - 28.0 mmol/L   Acid-base deficit 4.7 (H) 0.0 - 2.0 mmol/L   O2 Saturation 89.8 %   Patient temperature 36.1    Collection site RIGHT RADIAL    Drawn by A.CUBAS,RT    Sample type ARTERIAL DRAW    Allens test (pass/fail) PASS PASS    Comment: Performed at [facility], [place] [address] [place], [place] [address]   DG Chest Port 1 View  Result Date: 01/10/2021 CLINICAL DATA:  Pneumonia. EXAM: PORTABLE CHEST 1 VIEW COMPARISON:  01/05/2021. FINDINGS: Interim extubation. Feeding tube, left PICC line, right central line stable position. Cardiomegaly again noted. Low lung volumes. Bilateral interstitial prominence again noted. Low no pleural effusion or pneumothorax. IMPRESSION: 1. Interim extubation. Remaining lines and tubes in stable position. 2.  Cardiomegaly again noted. 3. Low lung volumes. Bilateral interstitial prominence again noted. Findings suggest interstitial edema and or pneumonitis. No significant change. Electronically Signed   By: Marcello Moores  Register   On: 01/10/2021 05:40   DG Abd Portable 1V  Result Date: 01/22/2021 CLINICAL DATA:  NG tube placement verification EXAM: PORTABLE ABDOMEN - 1 VIEW COMPARISON:  Radiograph 01/05/2021 FINDINGS: Terminus of a dual lumen dialysis catheter is seen at the superior cavoatrial junction. A left upper extremity PICC terminates at the right atrium. Transesophageal feeding tube tip terminates in the region of the distal gastric body/antrum. Telemetry leads overlie the chest. Low volumes and atelectasis with persistent patchy opacities in base and stable elevation of right  hemidiaphragm. Cardiomegaly is unchanged from comparison accounting for differences in technique. The aorta is calcified. The remaining cardiomediastinal contours are unremarkable. Upper abdominal bowel gas pattern is unremarkable. Please note this is not a complete view of the  abdomen. IMPRESSION: Transesophageal tube tip terminates in the region of the distal gastric body/antrum. Additional lines and tubes as above. Low volumes and atelectasis with elevation the right hemidiaphragm, similar to prior. Stable cardiomegaly and hazy opacities which could reflect edema versus other airspace disease. Electronically Signed   By: Lovena Le M.D.   On: 12/26/2020 20:10    Review Of Systems As per HPI  P: 75, R: 20, BP; 120/60, O2 sat 95 %. There were no vitals taken for this visit. There is no height or weight on file to calculate BMI. General appearance: alert, cooperative, appears stated age and moderate respiratory distress Head: Normocephalic, atraumatic. On face mask. Eyes: Brown eyes, pale conjunctiva, corneas clear. PERRL, EOM's intact. Neck: No adenopathy, no carotid bruit, no JVD, supple, symmetrical, trachea midline and thyroid not enlarged. Resp: Coarse crackels to auscultation bilaterally. Cardio: Irregular rate and rhythm, S1, S2 normal, II/VI systolic murmur, no click, rub or gallop GI: Soft, non-tender; bowel sounds normal; no organomegaly. Extremities: 1 + edema, no cyanosis or clubbing. Skin: Warm and dry.  Neurologic: Alert and oriented X 0.  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 If stable change to PO/Tube amiodarone 400 mg. Daily x 1 week then 200 mg. Daily.  Time spent: Review of old records, Lab, x-rays, EKG, other cardiac tests,  examination, discussion with patient/Nurse/PA over 70 minutes.  Birdie Riddle, MD  01/10/2021, 2:45 PM

## 2021-01-11 LAB — CBC
HCT: 22 % — ABNORMAL LOW (ref 36.0–46.0)
Hemoglobin: 7.2 g/dL — ABNORMAL LOW (ref 12.0–15.0)
MCH: 28.7 pg (ref 26.0–34.0)
MCHC: 32.7 g/dL (ref 30.0–36.0)
MCV: 87.6 fL (ref 80.0–100.0)
Platelets: 51 10*3/uL — ABNORMAL LOW (ref 150–400)
RBC: 2.51 MIL/uL — ABNORMAL LOW (ref 3.87–5.11)
RDW: 16.5 % — ABNORMAL HIGH (ref 11.5–15.5)
WBC: 21.9 10*3/uL — ABNORMAL HIGH (ref 4.0–10.5)
nRBC: 0.2 % (ref 0.0–0.2)

## 2021-01-11 LAB — RENAL FUNCTION PANEL
Albumin: 1.5 g/dL — ABNORMAL LOW (ref 3.5–5.0)
Anion gap: 10 (ref 5–15)
BUN: 19 mg/dL (ref 6–20)
CO2: 26 mmol/L (ref 22–32)
Calcium: 7.6 mg/dL — ABNORMAL LOW (ref 8.9–10.3)
Chloride: 96 mmol/L — ABNORMAL LOW (ref 98–111)
Creatinine, Ser: 1.89 mg/dL — ABNORMAL HIGH (ref 0.44–1.00)
GFR, Estimated: 31 mL/min — ABNORMAL LOW (ref >60)
Glucose, Bld: 63 mg/dL — ABNORMAL LOW (ref 70–99)
Phosphorus: 3.1 mg/dL (ref 2.5–4.6)
Potassium: 3.3 mmol/L — ABNORMAL LOW (ref 3.5–5.1)
Sodium: 132 mmol/L — ABNORMAL LOW (ref 135–145)

## 2021-01-11 LAB — BPAM RBC
Blood Product Expiration Date: 202206112359
ISSUE DATE / TIME: 202205191412
Unit Type and Rh: 7300

## 2021-01-11 LAB — MAGNESIUM: Magnesium: 1.9 mg/dL (ref 1.7–2.4)

## 2021-01-11 LAB — TYPE AND SCREEN
ABO/RH(D): B POS
Antibody Screen: NEGATIVE
Unit division: 0

## 2021-01-11 NOTE — Progress Notes (Signed)
[facility]  ROUNDING NOTE   Subjective:   Patient well-known to [place] from prior hospitalization. Now back with chronic wounds that had an acute exacerbation. Currently on dialysis on TTS schedule.  Objective:  Vital signs in last 24 hours:  Temperature 98.8 pulse 120 respirations 16 blood pressure 114/59  Physical Exam: General:  No acute distress  Head:  Normocephalic, atraumatic. Moist oral mucosal membranes  Eyes:  Anicteric  Neck:  Supple  Lungs:   Clear bilateral, normal effort  Heart:  S1S2 no rubs  Abdomen:   Soft, nontender, bowel sounds present  Extremities:  No lower extremity edema  Neurologic:  Awake, alert, conversant  Skin:  No acute rash  Access:  Right IJ PermCath in place    Basic Metabolic Panel: Recent Labs  Lab 01/05/21 0701 01/06/21 0253 01/07/21 0311 01/07/21 2007 01/07/21 2242 01/08/21 0508 01/08/21 1044 01/02/2021 0800 01/10/21 0408 01/10/21 2220 01/11/21 0555  NA 132* 132* 129*   < > 130* 134*  --  129* 129*  --  132*  K 4.0 3.3* 3.5   < > 3.7 3.2*  --  3.9 5.2*  --  3.3*  CL 98 98 96*  --   --  98  --  91* 95*  --  96*  CO2 17* 20* 21*  --   --  25  --  23 22  --  26  GLUCOSE 252* 355* 258*  --   --  128*  --  191* <20* 67* 63*  BUN 35* 39* 50*  --   --  24*  --  37* 46*  --  19  CREATININE 3.29* 3.32* 3.92*  --   --  2.26*  --  2.95* 3.37*  --  1.89*  CALCIUM 7.3* 7.4* 7.6*  --   --  7.6*  --  7.4* 8.1*  --  7.6*  MG 1.7 2.1  --   --   --   --  1.3* 2.3 2.4  --  1.9  PHOS 5.0* 5.3*  --   --   --   --   --  4.2 6.0*  --  3.1   < > = values in this interval not displayed.    Liver Function Tests: Recent Labs  Lab 01/11/2021 0800 01/10/21 0408 01/11/21 0555  AST  --  23  --   ALT  --  10  --   ALKPHOS  --  345*  --   BILITOT  --  0.5  --   PROT  --  5.2*  --   ALBUMIN 1.8* 1.9* 1.5*   No results for input(s): LIPASE, AMYLASE in the last 168 hours. Recent Labs  Lab 01/05/21 1600  AMMONIA 45*    CBC: Recent Labs   Lab 01/05/21 0240 01/06/21 0253 01/07/21 0311 01/07/21 2007 01/07/21 2242 01/10/21 0408 01/11/21 0555  WBC 9.5 8.3 14.4*  --   --  25.5* 21.9*  NEUTROABS  --   --   --   --   --  22.8*  --   HGB 9.6* 7.8* 7.7* 9.2* 9.2* 6.9* 7.2*  HCT 31.5* 25.2* 25.0* 27.0* 27.0* 22.3* 22.0*  MCV 91.6 92.3 92.3  --   --  91.4 87.6  PLT 88* 86* 85*  --   --  61* 51*    Cardiac Enzymes: No results for input(s): CKTOTAL, CKMB, CKMBINDEX, TROPONINI in the last 168 hours.  BNP: Invalid input(s): POCBNP  CBG: Recent Labs  Lab 01/14/2021 0724 12/23/2020 0806 01/14/2021 1142 01/15/2021 1145 01/12/2021 1525  GLUCAP 63* 261* 67* 168* 15    Microbiology: Results for orders placed or performed during the hospital encounter of 01/01/21  MRSA PCR Screening     Status: None   Collection Time: 01/01/21  7:04 PM   Specimen: Nasopharyngeal  Result Value Ref Range Status   MRSA by PCR NEGATIVE NEGATIVE Final    Comment:        The GeneXpert MRSA Assay (FDA approved for NASAL specimens only), is one component of a comprehensive MRSA colonization surveillance program. It is not intended to diagnose MRSA infection nor to guide or monitor treatment for MRSA infections. Performed at [facility], [place] [address] [place], [place] [address]   Culture, blood (Routine X 2) w Reflex to ID Panel     Status: None   Collection Time: 01/01/21  9:51 PM   Specimen: BLOOD LEFT FOREARM  Result Value Ref Range Status   Specimen Description BLOOD LEFT FOREARM  Final   Special Requests   Final    BOTTLES DRAWN AEROBIC AND ANAEROBIC Blood Culture adequate volume   Culture   Final    NO GROWTH 5 DAYS Performed at [facility], [place] [address] [place], [place] [address]    Report Status 01/06/2021 FINAL  Final  Culture, blood (Routine X 2) w Reflex to ID Panel     Status: None   Collection Time: 01/01/21 10:01 PM   Specimen: BLOOD  Result Value Ref Range Status   Specimen Description BLOOD RIGHT  ANTECUBITAL  Final   Special Requests   Final    BOTTLES DRAWN AEROBIC AND ANAEROBIC Blood Culture adequate volume   Culture   Final    NO GROWTH 5 DAYS Performed at [facility], [place] [address] [place], [place] [address]    Report Status 01/06/2021 FINAL  Final  Aerobic/Anaerobic Culture w Gram Stain (surgical/deep wound)     Status: Abnormal   Collection Time: 01/04/21 12:37 PM   Specimen: PATH Other; Tissue  Result Value Ref Range Status   Specimen Description ABSCESS  Final   Special Requests PERIRECTAL  Final   Gram Stain   Final    RARE WBC PRESENT, PREDOMINANTLY PMN NO ORGANISMS SEEN    Culture (A)  Final    MULTIPLE ORGANISMS PRESENT, NONE PREDOMINANT NO STAPHYLOCOCCUS AUREUS ISOLATED NO GROUP A STREP (S.PYOGENES) ISOLATED RARE BACTEROIDES OVATUS BETA LACTAMASE POSITIVE Performed at [facility], [place] [address] [place], [place] [address]    Report Status 01/07/2021 FINAL  Final    Coagulation Studies: Recent Labs    01/10/21 0408  LABPROT 16.6*  INR 1.3*    Urinalysis: No results for input(s): COLORURINE, LABSPEC, PHURINE, GLUCOSEU, HGBUR, BILIRUBINUR, KETONESUR, PROTEINUR, UROBILINOGEN, NITRITE, LEUKOCYTESUR in the last 72 hours.  Invalid input(s): APPERANCEUR    Imaging: DG CHEST PORT 1 VIEW  Result Date: 01/10/2021 CLINICAL DATA:  Possible aspiration EXAM: PORTABLE CHEST 1 VIEW COMPARISON:  01/10/2021 FINDINGS: Cardiac shadow is enlarged but stable. Right jugular dialysis catheter is again seen. Feeding catheter extends into the stomach. Increasing vascular congestion is noted with some suggestion of early parenchymal edema. No bony abnormality is noted. IMPRESSION: Increasing vascular congestion with early parenchymal edema. Electronically Signed   By: Inez Catalina M.D.   On: 01/10/2021 22:57   DG Chest Port 1 View  Result Date: 01/10/2021 CLINICAL DATA:  Pneumonia. EXAM: PORTABLE CHEST 1 VIEW COMPARISON:  01/05/2021. FINDINGS: Interim  extubation. Feeding tube, left PICC line, right central line stable position. Cardiomegaly again noted. Low lung volumes. Bilateral interstitial prominence again noted. Low no pleural effusion or pneumothorax. IMPRESSION: 1. Interim extubation. Remaining lines and tubes in stable position. 2.  Cardiomegaly again noted. 3. Low lung volumes. Bilateral interstitial prominence again noted. Findings suggest interstitial edema and or pneumonitis. No significant change. Electronically Signed   By: Marcello Moores  Register   On: 01/10/2021 05:40   DG Abd Portable 1V  Result Date: 12/26/2020 CLINICAL DATA:  NG tube placement verification EXAM: PORTABLE ABDOMEN - 1 VIEW COMPARISON:  Radiograph 01/05/2021 FINDINGS: Terminus of a dual lumen dialysis catheter is seen at the superior cavoatrial junction. A left upper extremity PICC terminates at the right atrium. Transesophageal feeding tube tip terminates in the region of the distal gastric body/antrum. Telemetry leads overlie the chest. Low volumes and atelectasis with persistent patchy opacities in base and stable elevation of right hemidiaphragm. Cardiomegaly is unchanged from comparison accounting for differences in technique. The aorta is calcified. The remaining cardiomediastinal contours are unremarkable. Upper abdominal bowel gas pattern is unremarkable. Please note this is not a complete view of the abdomen. IMPRESSION: Transesophageal tube tip terminates in the region of the distal gastric body/antrum. Additional lines and tubes as above. Low volumes and atelectasis with elevation the right hemidiaphragm, similar to prior. Stable cardiomegaly and hazy opacities which could reflect edema versus other airspace disease. Electronically Signed   By: Lovena Le M.D.   On: 01/20/2021 20:10     Medications:       Assessment/ Plan:  60 y.o. female with a PMHx of ESRD with a right IJ PermCath in place, left ischio rectal fossa necrotizing fasciitis status post surgical  debridement, sepsis, metabolic encephalopathy, acute respiratory failure, anemia of chronic kidney disease, atrial flutter with RVR, diabetes mellitus type 2, dysphagia, chronic systolic heart failure, who was admitted to [facility] orginally  on 10/16/2020 for wound care management, readmitted to [facility] on 01/17/2021.    1.  ESRD on HD.  We will switch the patient to TTS schedule during this hospitalization.  During her previous hospitalization.  At [facility] she was on MWF schedule.  2.  Anemia of chronic kidney disease.   Lab Results  Component Value Date   HGB 7.2 (L) 01/11/2021   Likely has some resistance to erythropoietin stimulating agent therapy due to chronic wounds and inflammation..  We will plan for Retacrit 10,000 IV with dialysis.  3.  Secondary hyperparathyroidism.  Phosphorus acceptable at 3.1.  4.  Thrombocytopenia.  Platelets down to 51,000.  We will make sure to pack catheter with sodium citrate.  5.  Hyponatremia.  Sodium down to 132.  Should improve with dialysis.   LOS: 0 Theresa Russell 5/20/20224:36 PM

## 2021-01-12 ENCOUNTER — Other Ambulatory Visit (HOSPITAL_COMMUNITY): Payer: Self-pay

## 2021-01-12 LAB — BLOOD GAS, ARTERIAL
Acid-base deficit: 1.6 mmol/L (ref 0.0–2.0)
Bicarbonate: 23.1 mmol/L (ref 20.0–28.0)
FIO2: 40
O2 Saturation: 91.1 %
Patient temperature: 34.4
pCO2 arterial: 37.7 mmHg (ref 32.0–48.0)
pH, Arterial: 7.389 (ref 7.350–7.450)
pO2, Arterial: 55.4 mmHg — ABNORMAL LOW (ref 83.0–108.0)

## 2021-01-12 LAB — RENAL FUNCTION PANEL
Albumin: 1.8 g/dL — ABNORMAL LOW (ref 3.5–5.0)
Anion gap: 16 — ABNORMAL HIGH (ref 5–15)
BUN: 26 mg/dL — ABNORMAL HIGH (ref 6–20)
CO2: 20 mmol/L — ABNORMAL LOW (ref 22–32)
Calcium: 8.4 mg/dL — ABNORMAL LOW (ref 8.9–10.3)
Chloride: 94 mmol/L — ABNORMAL LOW (ref 98–111)
Creatinine, Ser: 2.73 mg/dL — ABNORMAL HIGH (ref 0.44–1.00)
GFR, Estimated: 20 mL/min — ABNORMAL LOW (ref >60)
Glucose, Bld: 170 mg/dL — ABNORMAL HIGH (ref 70–99)
Phosphorus: 4.7 mg/dL — ABNORMAL HIGH (ref 2.5–4.6)
Potassium: 4.9 mmol/L (ref 3.5–5.1)
Sodium: 130 mmol/L — ABNORMAL LOW (ref 135–145)

## 2021-01-12 LAB — AMMONIA: Ammonia: 30 umol/L (ref 9–35)

## 2021-01-12 LAB — CBC
HCT: 26.3 % — ABNORMAL LOW (ref 36.0–46.0)
Hemoglobin: 8.4 g/dL — ABNORMAL LOW (ref 12.0–15.0)
MCH: 28.1 pg (ref 26.0–34.0)
MCHC: 31.9 g/dL (ref 30.0–36.0)
MCV: 88 fL (ref 80.0–100.0)
Platelets: 118 10*3/uL — ABNORMAL LOW (ref 150–400)
RBC: 2.99 MIL/uL — ABNORMAL LOW (ref 3.87–5.11)
RDW: 17 % — ABNORMAL HIGH (ref 11.5–15.5)
WBC: 23.9 10*3/uL — ABNORMAL HIGH (ref 4.0–10.5)
nRBC: 0.8 % — ABNORMAL HIGH (ref 0.0–0.2)

## 2021-01-12 LAB — HEPATITIS B SURFACE ANTIGEN: Hepatitis B Surface Ag: NONREACTIVE

## 2021-01-13 LAB — POTASSIUM: Potassium: 3.8 mmol/L (ref 3.5–5.1)

## 2021-01-14 ENCOUNTER — Other Ambulatory Visit (HOSPITAL_COMMUNITY): Payer: Self-pay

## 2021-01-14 LAB — CBC
HCT: 26.7 % — ABNORMAL LOW (ref 36.0–46.0)
Hemoglobin: 8.6 g/dL — ABNORMAL LOW (ref 12.0–15.0)
MCH: 28.1 pg (ref 26.0–34.0)
MCHC: 32.2 g/dL (ref 30.0–36.0)
MCV: 87.3 fL (ref 80.0–100.0)
Platelets: 95 10*3/uL — ABNORMAL LOW (ref 150–400)
RBC: 3.06 MIL/uL — ABNORMAL LOW (ref 3.87–5.11)
RDW: 17.9 % — ABNORMAL HIGH (ref 11.5–15.5)
WBC: 20.1 10*3/uL — ABNORMAL HIGH (ref 4.0–10.5)
nRBC: 1.9 % — ABNORMAL HIGH (ref 0.0–0.2)

## 2021-01-14 LAB — BLOOD GAS, ARTERIAL
Acid-base deficit: 3.1 mmol/L — ABNORMAL HIGH (ref 0.0–2.0)
Bicarbonate: 22.2 mmol/L (ref 20.0–28.0)
FIO2: 60
O2 Saturation: 91.4 %
Patient temperature: 37
pCO2 arterial: 45.6 mmHg (ref 32.0–48.0)
pH, Arterial: 7.309 — ABNORMAL LOW (ref 7.350–7.450)
pO2, Arterial: 66.9 mmHg — ABNORMAL LOW (ref 83.0–108.0)

## 2021-01-14 LAB — RENAL FUNCTION PANEL
Albumin: 1.5 g/dL — ABNORMAL LOW (ref 3.5–5.0)
Anion gap: 15 (ref 5–15)
BUN: 21 mg/dL — ABNORMAL HIGH (ref 6–20)
CO2: 23 mmol/L (ref 22–32)
Calcium: 8.3 mg/dL — ABNORMAL LOW (ref 8.9–10.3)
Chloride: 91 mmol/L — ABNORMAL LOW (ref 98–111)
Creatinine, Ser: 2.35 mg/dL — ABNORMAL HIGH (ref 0.44–1.00)
GFR, Estimated: 24 mL/min — ABNORMAL LOW (ref >60)
Glucose, Bld: 140 mg/dL — ABNORMAL HIGH (ref 70–99)
Phosphorus: 3.4 mg/dL (ref 2.5–4.6)
Potassium: 3.5 mmol/L (ref 3.5–5.1)
Sodium: 129 mmol/L — ABNORMAL LOW (ref 135–145)

## 2021-01-14 LAB — EXPECTORATED SPUTUM ASSESSMENT W GRAM STAIN, RFLX TO RESP C

## 2021-01-14 LAB — HEPATITIS B SURFACE ANTIBODY, QUANTITATIVE: Hep B S AB Quant (Post): 7.6 m[IU]/mL — ABNORMAL LOW (ref >9.9)

## 2021-01-14 LAB — MAGNESIUM: Magnesium: 2 mg/dL (ref 1.7–2.4)

## 2021-01-14 NOTE — Progress Notes (Signed)
[facility]  ROUNDING NOTE   Subjective:   Patient tachycardic this a.m. and on amiodarone drip. Reports generalized pain at the moment. Due for dialysis treatment again tomorrow.  Objective:  Vital signs in last 24 hours:  Temperature nine 9.1 pulse 129 respirations 31 blood pressure 115/89  Physical Exam: General:  No acute distress  Head:  Normocephalic, atraumatic. Moist oral mucosal membranes  Eyes:  Anicteric  Neck:  Supple  Lungs:   Clear bilateral, normal effort  Heart:  S1S2 no rubs  Abdomen:   Soft, nontender, bowel sounds present  Extremities:  1+ bilateral lower extremity edema  Neurologic:  Awake, alert, conversant  Skin:  No acute rash  Access:  Right IJ PermCath in place    Basic Metabolic Panel: Recent Labs  Lab 01/08/21 0508 01/08/21 1044 12/26/2020 0800 01/10/21 0408 01/10/21 2220 01/11/21 0555 01/12/21 0618 01/13/21 1434  NA 134*  --  129* 129*  --  132* 130*  --   K 3.2*  --  3.9 5.2*  --  3.3* 4.9 3.8  CL 98  --  91* 95*  --  96* 94*  --   CO2 25  --  23 22  --  26 20*  --   GLUCOSE 128*  --  191* <20* 67* 63* 170*  --   BUN 24*  --  37* 46*  --  19 26*  --   CREATININE 2.26*  --  2.95* 3.37*  --  1.89* 2.73*  --   CALCIUM 7.6*  --  7.4* 8.1*  --  7.6* 8.4*  --   MG  --  1.3* 2.3 2.4  --  1.9  --   --   PHOS  --   --  4.2 6.0*  --  3.1 4.7*  --     Liver Function Tests: Recent Labs  Lab 01/17/2021 0800 01/10/21 0408 01/11/21 0555 01/12/21 0618  AST  --  23  --   --   ALT  --  10  --   --   ALKPHOS  --  345*  --   --   BILITOT  --  0.5  --   --   PROT  --  5.2*  --   --   ALBUMIN 1.8* 1.9* 1.5* 1.8*   No results for input(s): LIPASE, AMYLASE in the last 168 hours. Recent Labs  Lab 01/12/21 1249  AMMONIA 30    CBC: Recent Labs  Lab 01/07/21 2007 01/07/21 2242 01/10/21 0408 01/11/21 0555 01/12/21 1249  WBC  --   --  25.5* 21.9* 23.9*  NEUTROABS  --   --  22.8*  --   --   HGB 9.2* 9.2* 6.9* 7.2* 8.4*  HCT  27.0* 27.0* 22.3* 22.0* 26.3*  MCV  --   --  91.4 87.6 88.0  PLT  --   --  61* 51* 118*    Cardiac Enzymes: No results for input(s): CKTOTAL, CKMB, CKMBINDEX, TROPONINI in the last 168 hours.  BNP: Invalid input(s): POCBNP  CBG: Recent Labs  Lab 01/02/2021 0724 12/25/2020 0806 01/04/2021 1142 01/15/2021 1145 01/19/2021 1525  GLUCAP 63* 261* 67* 168* 7    Microbiology: Results for orders placed or performed during the hospital encounter of 01/01/21  MRSA PCR Screening     Status: None   Collection Time: 01/01/21  7:04 PM   Specimen: Nasopharyngeal  Result Value Ref Range Status   MRSA by PCR NEGATIVE NEGATIVE Final  Comment:        The GeneXpert MRSA Assay (FDA approved for NASAL specimens only), is one component of a comprehensive MRSA colonization surveillance program. It is not intended to diagnose MRSA infection nor to guide or monitor treatment for MRSA infections. Performed at [facility], [place] [address] [place], [place] [address]   Culture, blood (Routine X 2) w Reflex to ID Panel     Status: None   Collection Time: 01/01/21  9:51 PM   Specimen: BLOOD LEFT FOREARM  Result Value Ref Range Status   Specimen Description BLOOD LEFT FOREARM  Final   Special Requests   Final    BOTTLES DRAWN AEROBIC AND ANAEROBIC Blood Culture adequate volume   Culture   Final    NO GROWTH 5 DAYS Performed at [facility], [place] [address], [place], [place] [address]    Report Status 01/06/2021 FINAL  Final  Culture, blood (Routine X 2) w Reflex to ID Panel     Status: None   Collection Time: 01/01/21 10:01 PM   Specimen: BLOOD  Result Value Ref Range Status   Specimen Description BLOOD RIGHT ANTECUBITAL  Final   Special Requests   Final    BOTTLES DRAWN AEROBIC AND ANAEROBIC Blood Culture adequate volume   Culture   Final    NO GROWTH 5 DAYS Performed at [facility], [place] [address] [place], [place] [address]    Report Status 01/06/2021 FINAL  Final   Aerobic/Anaerobic Culture w Gram Stain (surgical/deep wound)     Status: Abnormal   Collection Time: 01/04/21 12:37 PM   Specimen: PATH Other; Tissue  Result Value Ref Range Status   Specimen Description ABSCESS  Final   Special Requests PERIRECTAL  Final   Gram Stain   Final    RARE WBC PRESENT, PREDOMINANTLY PMN NO ORGANISMS SEEN    Culture (A)  Final    MULTIPLE ORGANISMS PRESENT, NONE PREDOMINANT NO STAPHYLOCOCCUS AUREUS ISOLATED NO GROUP A STREP (S.PYOGENES) ISOLATED RARE BACTEROIDES OVATUS BETA LACTAMASE POSITIVE Performed at [facility], [place] [address], [place], [place] [address]    Report Status 01/07/2021 FINAL  Final    Coagulation Studies: No results for input(s): LABPROT, INR in the last 72 hours.  Urinalysis: No results for input(s): COLORURINE, LABSPEC, PHURINE, GLUCOSEU, HGBUR, BILIRUBINUR, KETONESUR, PROTEINUR, UROBILINOGEN, NITRITE, LEUKOCYTESUR in the last 72 hours.  Invalid input(s): APPERANCEUR    Imaging: DG Chest Port 1 View  Result Date: 01/14/2021 CLINICAL DATA:  Pneumonia.  Pulmonary edema. EXAM: PORTABLE CHEST 1 VIEW.  AP portable semi erect.  Patient is rotated. COMPARISON:  Chest x-ray 01/10/2021 FINDINGS: Right chest wall dialysis catheter with tip in stable position overlying the expected region of the superior vena cava. Enteric tube coursing below the hemidiaphragm with tip collimated off view. Left PICC with tip overlying the expected region of the right atrium. The heart size and mediastinal contours are unchanged. Aortic arch calcification. Low lung volume with increased right upper lobe and left mid lower lung zone airspace opacities. Slightly improved aeration of the right lower lung zone with persistent airspace opacities. Likely trace left pleural effusion. No pneumothorax. No acute osseous abnormality. IMPRESSION: 1. Low lung volume with increased right upper lobe and left mid lower lung zone airspace opacities. 2. Slightly improved  aeration of the right lower lung zone with persistent airspace opacities. 3. Lines and tubes in stable position. Electronically Signed   By: Clelia Croft.D.  On: 01/14/2021 06:10     Medications:       Assessment/ Plan:  60 y.o. female with a PMHx of ESRD with a right IJ PermCath in place, left ischio rectal fossa necrotizing fasciitis status post surgical debridement, sepsis, metabolic encephalopathy, acute respiratory failure, anemia of chronic kidney disease, atrial flutter with RVR, diabetes mellitus type 2, dysphagia, chronic systolic heart failure, who was admitted to [facility] orginally  on 10/16/2020 for wound care management, readmitted to [facility] on 01/03/2021.    1.  ESRD on HD.  Continue dialysis on TTS schedule.  2.  Anemia of chronic kidney disease.   Lab Results  Component Value Date   HGB 8.4 (L) 01/12/2021   Hemoglobin up to 8.4 at last check.  3.  Secondary hyperparathyroidism.  Phosphorus at target of 4.7.  4.  Thrombocytopenia.  Platelets now up to 118,000.  Continue to use sodium citrate for catheter packing.  5.  Hyponatremia.  Serum sodium 130 at last check.  Should hopefully stabilize with ongoing dialysis treatments.   LOS: 0 Bhargav Barbaro 5/23/20228:05 AM

## 2021-01-14 NOTE — Consult Note (Signed)
Ref: Pcp, No   Subjective:  Awake.  Tachycardic. On amiodarone drip for over 1 week at near 800 mg. per day. Awaiting hemodialysis tomorrow.  Objective:  Vital Signs in the last 24 hours:  P:137, R: 34, BP: 91/48, on 60 % O2 by BiPAP and 100 % saturation.  Physical Exam: BP Readings from Last 1 Encounters:  01/03/2021 111/69     Wt Readings from Last 1 Encounters:  12/25/2020 105.3 kg    Weight change:  There is no height or weight on file to calculate BMI. HEENT: [place]/AT, Eyes-Brown, Conjunctiva-Pale, Sclera-Non-icteric Neck: No JVD, No bruit, Trachea midline. Lungs:  Clear, Bilateral. Cardiac:  TachycardicRegular rhythm, normal S1 and S2, no S3. II/VI systolic murmur. Abdomen:  Soft, non-tender. BS present. Extremities:  1 + edema present. No cyanosis. No clubbing. CNS: AxOx1, Cranial nerves grossly intact, moves all 4 extremities.  Skin: Warm and dry.   Intake/Output from previous day: No intake/output data recorded.    Lab Results: BMET    Component Value Date/Time   NA 129 (L) 01/14/2021 0803   NA 130 (L) 01/12/2021 0618   NA 132 (L) 01/11/2021 0555   K 3.5 01/14/2021 0803   K 3.8 01/13/2021 1434   K 4.9 01/12/2021 0618   CL 91 (L) 01/14/2021 0803   CL 94 (L) 01/12/2021 0618   CL 96 (L) 01/11/2021 0555   CO2 23 01/14/2021 0803   CO2 20 (L) 01/12/2021 0618   CO2 26 01/11/2021 0555   GLUCOSE 140 (H) 01/14/2021 0803   GLUCOSE 170 (H) 01/12/2021 0618   GLUCOSE 63 (L) 01/11/2021 0555   BUN 21 (H) 01/14/2021 0803   BUN 26 (H) 01/12/2021 0618   BUN 19 01/11/2021 0555   CREATININE 2.35 (H) 01/14/2021 0803   CREATININE 2.73 (H) 01/12/2021 0618   CREATININE 1.89 (H) 01/11/2021 0555   CALCIUM 8.3 (L) 01/14/2021 0803   CALCIUM 8.4 (L) 01/12/2021 0618   CALCIUM 7.6 (L) 01/11/2021 0555   GFRNONAA 24 (L) 01/14/2021 0803   GFRNONAA 20 (L) 01/12/2021 0618   GFRNONAA 31 (L) 01/11/2021 0555   CBC    Component Value Date/Time   WBC 20.1 (H) 01/14/2021 0803   RBC 3.06  (L) 01/14/2021 0803   HGB 8.6 (L) 01/14/2021 0803   HCT 26.7 (L) 01/14/2021 0803   PLT 95 (L) 01/14/2021 0803   MCV 87.3 01/14/2021 0803   MCH 28.1 01/14/2021 0803   MCHC 32.2 01/14/2021 0803   RDW 17.9 (H) 01/14/2021 0803   LYMPHSABS 1.4 01/10/2021 0408   MONOABS 1.0 01/10/2021 0408   EOSABS 0.0 01/10/2021 0408   BASOSABS 0.0 01/10/2021 0408   HEPATIC Function Panel Recent Labs    11/02/20 0547 01/01/21 2039 01/10/21 0408  PROT 6.3* 6.2* 5.2*   HEMOGLOBIN A1C No components found for: HGA1C,  MPG CARDIAC ENZYMES No results found for: CKTOTAL, CKMB, CKMBINDEX, TROPONINI BNP No results for input(s): PROBNP in the last 8760 hours. TSH No results for input(s): TSH in the last 8760 hours. CHOLESTEROL No results for input(s): CHOL in the last 8760 hours.  Scheduled Meds: Continuous Infusions: PRN Meds:.  Assessment/Plan: Atrial fibrillation with RVR Acute on chronic respiratory failure Acute on chronic systolic left heart failure Sepsis ESRD Rectal area and right leg wounds Type 2 DM  Add dioxin for rate control. Decrease amiodarone by 50 % drip rate.   LOS: 0 days   Time spent including chart review, lab review, examination, discussion with patient/Nurse : 30 min  Dixie Dials  MD  01/14/2021, 11:55 PM

## 2021-01-15 LAB — RENAL FUNCTION PANEL
Albumin: 1.7 g/dL — ABNORMAL LOW (ref 3.5–5.0)
Anion gap: 15 (ref 5–15)
BUN: 24 mg/dL — ABNORMAL HIGH (ref 6–20)
CO2: 20 mmol/L — ABNORMAL LOW (ref 22–32)
Calcium: 8 mg/dL — ABNORMAL LOW (ref 8.9–10.3)
Chloride: 97 mmol/L — ABNORMAL LOW (ref 98–111)
Creatinine, Ser: 2.64 mg/dL — ABNORMAL HIGH (ref 0.44–1.00)
GFR, Estimated: 20 mL/min — ABNORMAL LOW (ref >60)
Glucose, Bld: 300 mg/dL — ABNORMAL HIGH (ref 70–99)
Phosphorus: 3 mg/dL (ref 2.5–4.6)
Potassium: 3.3 mmol/L — ABNORMAL LOW (ref 3.5–5.1)
Sodium: 132 mmol/L — ABNORMAL LOW (ref 135–145)

## 2021-01-15 LAB — CBC
HCT: 23.4 % — ABNORMAL LOW (ref 36.0–46.0)
Hemoglobin: 7.5 g/dL — ABNORMAL LOW (ref 12.0–15.0)
MCH: 28.5 pg (ref 26.0–34.0)
MCHC: 32.1 g/dL (ref 30.0–36.0)
MCV: 89 fL (ref 80.0–100.0)
Platelets: 73 10*3/uL — ABNORMAL LOW (ref 150–400)
RBC: 2.63 MIL/uL — ABNORMAL LOW (ref 3.87–5.11)
RDW: 18.7 % — ABNORMAL HIGH (ref 11.5–15.5)
WBC: 24.1 10*3/uL — ABNORMAL HIGH (ref 4.0–10.5)
nRBC: 1.2 % — ABNORMAL HIGH (ref 0.0–0.2)

## 2021-01-15 LAB — MAGNESIUM: Magnesium: 1.8 mg/dL (ref 1.7–2.4)

## 2021-01-15 NOTE — Consult Note (Signed)
Ref: Pcp, No   Subjective:  Awake. Talking to 2 family members who are visiting her now. She had hemodialysis today. She is maintain oxygen saturation and blood pressure.  Objective:  Vital Signs in the last 24 hours:  P: 134, BP: 124/60, O2 sat 94 % on BiPAP.  Physical Exam: BP Readings from Last 1 Encounters:  01/02/2021 111/69     Wt Readings from Last 1 Encounters:  12/29/2020 105.3 kg    Weight change:  There is no height or weight on file to calculate BMI. HEENT: [place]/AT, Eyes-Brown, Conjunctiva-Pale, Sclera-Non-icteric Neck: No JVD, No bruit, Trachea midline. Lungs:  Clearing, Bilateral. Cardiac:  Tachycardic, Regular rhythm, normal S1 and S2, no S3. II/VI systolic murmur. Abdomen:  Soft, non-tender. BS present. Extremities:  Trace edema and lower leg wounds with dressing are present. No cyanosis. No clubbing.  CNS: AxOx2, Cranial nerves grossly intact, moves all 4 extremities.  Skin: Warm and dry.   Intake/Output from previous day: No intake/output data recorded.    Lab Results: BMET    Component Value Date/Time   NA 132 (L) 01/15/2021 0757   NA 129 (L) 01/14/2021 0803   NA 130 (L) 01/12/2021 0618   K 3.3 (L) 01/15/2021 0757   K 3.5 01/14/2021 0803   K 3.8 01/13/2021 1434   CL 97 (L) 01/15/2021 0757   CL 91 (L) 01/14/2021 0803   CL 94 (L) 01/12/2021 0618   CO2 20 (L) 01/15/2021 0757   CO2 23 01/14/2021 0803   CO2 20 (L) 01/12/2021 0618   GLUCOSE 300 (H) 01/15/2021 0757   GLUCOSE 140 (H) 01/14/2021 0803   GLUCOSE 170 (H) 01/12/2021 0618   BUN 24 (H) 01/15/2021 0757   BUN 21 (H) 01/14/2021 0803   BUN 26 (H) 01/12/2021 0618   CREATININE 2.64 (H) 01/15/2021 0757   CREATININE 2.35 (H) 01/14/2021 0803   CREATININE 2.73 (H) 01/12/2021 0618   CALCIUM 8.0 (L) 01/15/2021 0757   CALCIUM 8.3 (L) 01/14/2021 0803   CALCIUM 8.4 (L) 01/12/2021 0618   GFRNONAA 20 (L) 01/15/2021 0757   GFRNONAA 24 (L) 01/14/2021 0803   GFRNONAA 20 (L) 01/12/2021 0618   CBC     Component Value Date/Time   WBC 24.1 (H) 01/15/2021 0757   RBC 2.63 (L) 01/15/2021 0757   HGB 7.5 (L) 01/15/2021 0757   HCT 23.4 (L) 01/15/2021 0757   PLT 73 (L) 01/15/2021 0757   MCV 89.0 01/15/2021 0757   MCH 28.5 01/15/2021 0757   MCHC 32.1 01/15/2021 0757   RDW 18.7 (H) 01/15/2021 0757   LYMPHSABS 1.4 01/10/2021 0408   MONOABS 1.0 01/10/2021 0408   EOSABS 0.0 01/10/2021 0408   BASOSABS 0.0 01/10/2021 0408   HEPATIC Function Panel Recent Labs    11/02/20 0547 01/01/21 2039 01/10/21 0408  PROT 6.3* 6.2* 5.2*   HEMOGLOBIN A1C No components found for: HGA1C,  MPG CARDIAC ENZYMES No results found for: CKTOTAL, CKMB, CKMBINDEX, TROPONINI BNP No results for input(s): PROBNP in the last 8760 hours. TSH No results for input(s): TSH in the last 8760 hours. CHOLESTEROL No results for input(s): CHOL in the last 8760 hours.  Scheduled Meds: Continuous Infusions: PRN Meds:.  Assessment/Plan: Atrial fibrillation with RVR Acute on chronic respiratory failure Acute on chronic systolic left heart failure Sepsis ESRD Type 2 DM Rectal area and lower leg wounds.  Continue IV amiodarone till able to resume enteral feeding.   LOS: 0 days   Time spent including chart review, lab review, examination,  discussion with patient/Nurse/Family : 30 min   Dixie Dials  MD  01/15/2021, 7:28 PM

## 2021-01-16 LAB — CBC
HCT: 23.1 % — ABNORMAL LOW (ref 36.0–46.0)
Hemoglobin: 7.8 g/dL — ABNORMAL LOW (ref 12.0–15.0)
MCH: 28 pg (ref 26.0–34.0)
MCHC: 33.8 g/dL (ref 30.0–36.0)
MCV: 82.8 fL (ref 80.0–100.0)
Platelets: UNDETERMINED 10*3/uL (ref 150–400)
RBC: 2.79 MIL/uL — ABNORMAL LOW (ref 3.87–5.11)
RDW: 17.9 % — ABNORMAL HIGH (ref 11.5–15.5)
WBC: 26.6 10*3/uL — ABNORMAL HIGH (ref 4.0–10.5)
nRBC: 1.7 % — ABNORMAL HIGH (ref 0.0–0.2)

## 2021-01-18 LAB — BLOOD CULTURE ID PANEL (REFLEXED) - BCID2

## 2021-01-20 LAB — CULTURE, BLOOD (ROUTINE X 2): Culture: NO GROWTH

## 2021-01-23 NOTE — Progress Notes (Signed)
[facility]  ROUNDING NOTE   Subjective:   Heart rate is down a bit today. Currently on BiPAP. Due for hemodialysis again tomorrow.  Objective:  Vital signs in last 24 hours:  Temperature 98.1 pulse 100 respirations 30 blood pressure 132/58  Physical Exam: General:  No acute distress  Head:  Normocephalic, atraumatic. Moist oral mucosal membranes  Eyes:  Anicteric  Neck:  Supple  Lungs:   Scattered rhonchi, on BiPAP  Heart:  S1S2 no rubs  Abdomen:   Soft, nontender, bowel sounds present  Extremities:  1+ bilateral lower extremity edema  Neurologic:  Currently on BiPAP  Skin:  No acute rash  Access:  Right IJ PermCath in place    Basic Metabolic Panel: Recent Labs  Lab 01/10/21 0408 01/10/21 2220 01/11/21 0555 01/12/21 0618 01/13/21 1434 01/14/21 0803 01/15/21 0757  NA 129*  --  132* 130*  --  129* 132*  K 5.2*  --  3.3* 4.9 3.8 3.5 3.3*  CL 95*  --  96* 94*  --  91* 97*  CO2 22  --  26 20*  --  23 20*  GLUCOSE <20* 67* 63* 170*  --  140* 300*  BUN 46*  --  19 26*  --  21* 24*  CREATININE 3.37*  --  1.89* 2.73*  --  2.35* 2.64*  CALCIUM 8.1*  --  7.6* 8.4*  --  8.3* 8.0*  MG 2.4  --  1.9  --   --  2.0 1.8  PHOS 6.0*  --  3.1 4.7*  --  3.4 3.0    Liver Function Tests: Recent Labs  Lab 01/10/21 0408 01/11/21 0555 01/12/21 0618 01/14/21 0803 01/15/21 0757  AST 23  --   --   --   --   ALT 10  --   --   --   --   ALKPHOS 345*  --   --   --   --   BILITOT 0.5  --   --   --   --   PROT 5.2*  --   --   --   --   ALBUMIN 1.9* 1.5* 1.8* 1.5* 1.7*   No results for input(s): LIPASE, AMYLASE in the last 168 hours. Recent Labs  Lab 01/12/21 1249  AMMONIA 30    CBC: Recent Labs  Lab 01/10/21 0408 01/11/21 0555 01/12/21 1249 01/14/21 0803 01/15/21 0757 2021-01-25 0256  WBC 25.5* 21.9* 23.9* 20.1* 24.1* 26.6*  NEUTROABS 22.8*  --   --   --   --   --   HGB 6.9* 7.2* 8.4* 8.6* 7.5* 7.8*  HCT 22.3* 22.0* 26.3* 26.7* 23.4* 23.1*  MCV 91.4 87.6  88.0 87.3 89.0 82.8  PLT 61* 51* 118* 95* 73* PLATELET CLUMPS NOTED ON SMEAR, UNABLE TO ESTIMATE    Cardiac Enzymes: No results for input(s): CKTOTAL, CKMB, CKMBINDEX, TROPONINI in the last 168 hours.  BNP: Invalid input(s): POCBNP  CBG: Recent Labs  Lab 12/23/2020 0806 01/08/2021 1142 01/07/2021 1145 01/19/2021 1525  GLUCAP 261* 67* 168* 33    Microbiology: Results for orders placed or performed during the hospital encounter of 01/06/2021  Expectorated Sputum Assessment w Gram Stain, Rflx to Resp Cult     Status: None   Collection Time: 01/13/21 12:30 PM   Specimen: Expectorated Sputum  Result Value Ref Range Status   Specimen Description EXPECTORATED SPUTUM  Final   Special Requests NONE  Final   Sputum evaluation   Final  Sputum specimen not acceptable for testing.  Please recollect.   RESULT CALLED TO, READ BACK BY AND VERIFIED WITH: RN H.OARLL AT 9211 ON 01/14/2021 BY T.SAAD. Performed at [facility], [place] [address] [place], [place] [address]    Report Status 01/14/2021 FINAL  Final    Coagulation Studies: No results for input(s): LABPROT, INR in the last 72 hours.  Urinalysis: No results for input(s): COLORURINE, LABSPEC, PHURINE, GLUCOSEU, HGBUR, BILIRUBINUR, KETONESUR, PROTEINUR, UROBILINOGEN, NITRITE, LEUKOCYTESUR in the last 72 hours.  Invalid input(s): APPERANCEUR    Imaging: No results found.   Medications:       Assessment/ Plan:  60 y.o. female with a PMHx of ESRD with a right IJ PermCath in place, left ischio rectal fossa necrotizing fasciitis status post surgical debridement, sepsis, metabolic encephalopathy, acute respiratory failure, anemia of chronic kidney disease, atrial flutter with RVR, diabetes mellitus type 2, dysphagia, chronic systolic heart failure, who was admitted to [facility] orginally  on 10/16/2020 for wound care management, readmitted to [facility] on 01/11/2021.    1.  ESRD on HD.  No immediate need for dialysis today.  We  will plan for hemodialysis treatment again tomorrow.  2.  Anemia of chronic kidney disease.   Lab Results  Component Value Date   HGB 7.8 (L) 17-Jan-2021   Hemoglobin down a bit to 7.8.  Continue to monitor closely.  3.  Secondary hyperparathyroidism.  Phosphorus acceptable at 3.0 at last check.  Continue periodically monitor.  4.  Thrombocytopenia.  Platelets continue to fluctuate.  Most recent platelet count was 73,000.  5.  Hyponatremia.  Repeat serum sodium tomorrow.  Last serum sodium was 132.   LOS: 0 Suhey Radford 05/26/20228:05 AM

## 2021-01-23 DEATH — deceased

## 2022-08-25 IMAGING — DX DG ABD PORTABLE 1V
1 series · 1 of 1 positions shown · non-contrast
Comparison: Radiograph 01/05/2021

CLINICAL DATA: NG tube placement verification

EXAM:
PORTABLE ABDOMEN - 1 VIEW

[abdomen]
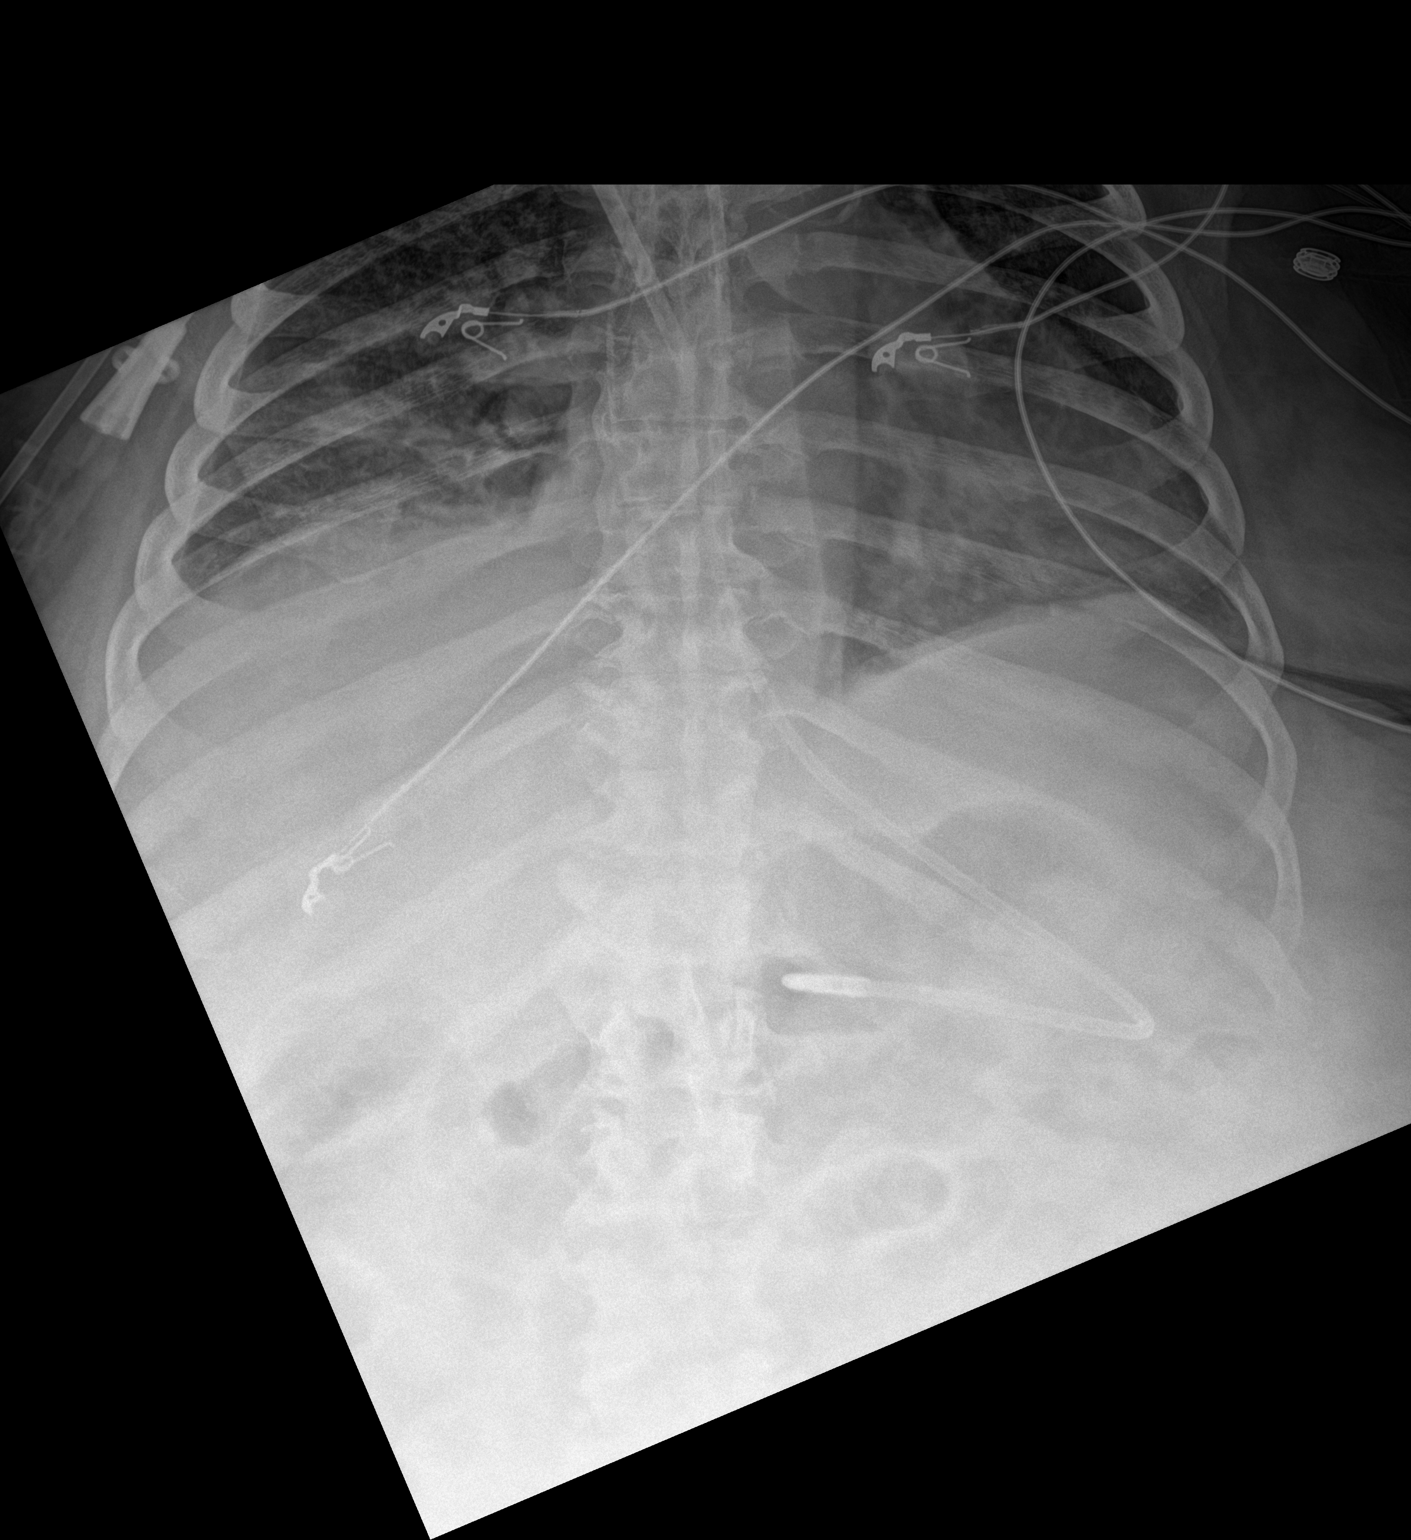

[1 of 1 positions shown; findings below may reference images not displayed]

FINDINGS: Terminus of a dual lumen dialysis catheter is seen at the superior
cavoatrial junction.

A left upper extremity PICC terminates at the right atrium.

Transesophageal feeding tube tip terminates in the region of the
distal gastric body/antrum.

Telemetry leads overlie the chest.

Low volumes and atelectasis with persistent patchy opacities in base
and stable elevation of right hemidiaphragm. Cardiomegaly is
unchanged from comparison accounting for differences in technique.
The aorta is calcified. The remaining cardiomediastinal contours are
unremarkable. Upper abdominal bowel gas pattern is unremarkable.
Please note this is not a complete view of the abdomen.
IMPRESSION: Transesophageal tube tip terminates in the region of the distal
gastric body/antrum.

Additional lines and tubes as above.

Low volumes and atelectasis with elevation the right hemidiaphragm,
similar to prior.

Stable cardiomegaly and hazy opacities which could reflect edema
versus other airspace disease.

## 2022-08-30 IMAGING — DX DG CHEST 1V PORT
1 series · 1 of 1 positions shown · non-contrast
Comparison: Chest x-ray 01/10/2021

CLINICAL DATA: Pneumonia.  Pulmonary edema.

EXAM:
PORTABLE CHEST 1 VIEW.  AP portable semi erect.  Patient is rotated.

[chest ap]
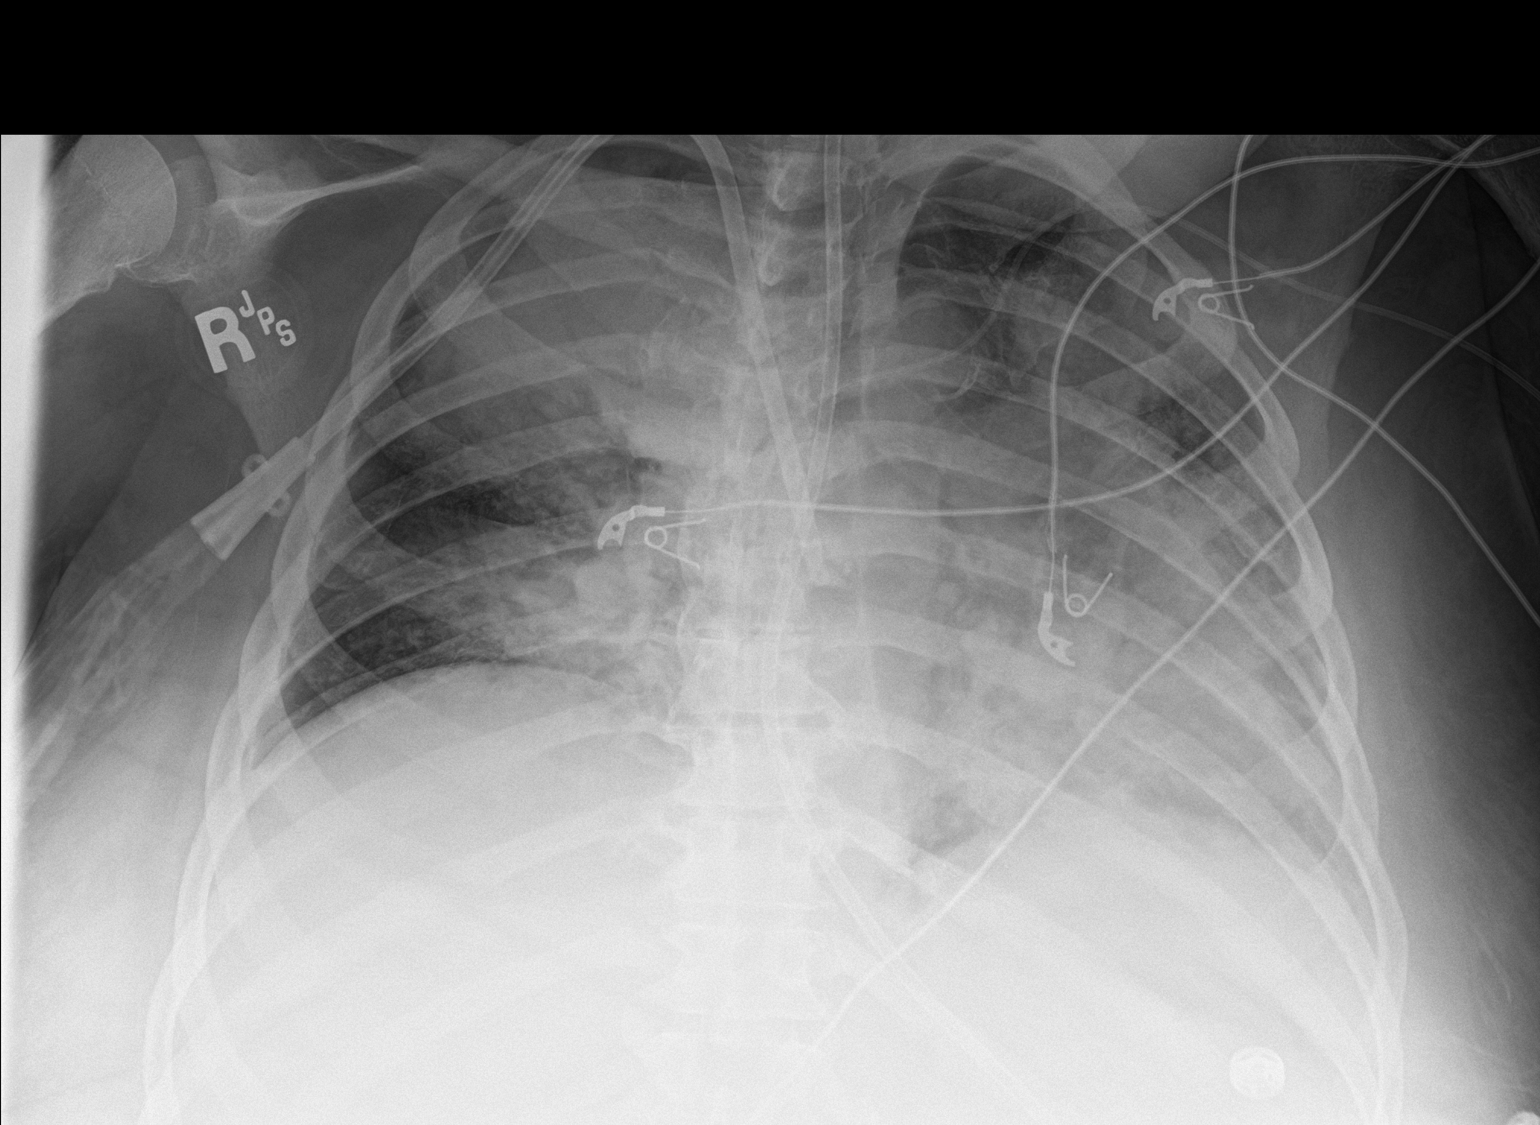

[1 of 1 positions shown; findings below may reference images not displayed]

FINDINGS: Right chest wall dialysis catheter with tip in stable position
overlying the expected region of the superior vena cava. Enteric
tube coursing below the hemidiaphragm with tip collimated off view.
Left PICC with tip overlying the expected region of the right
atrium.

The heart size and mediastinal contours are unchanged. Aortic arch
calcification.

Low lung volume with increased right upper lobe and left mid lower
lung zone airspace opacities. Slightly improved aeration of the
right lower lung zone with persistent airspace opacities. Likely
trace left pleural effusion. No pneumothorax.

No acute osseous abnormality.
IMPRESSION: 1. Low lung volume with increased right upper lobe and left mid
lower lung zone airspace opacities.
2. Slightly improved aeration of the right lower lung zone with
persistent airspace opacities.
3. Lines and tubes in stable position.
